# Patient Record
Sex: Female | Born: 1955 | Hispanic: Refuse to answer | Marital: Married | Smoking: Never smoker
Health system: Southern US, Community
[De-identification: ages and names within clinical notes are randomized; demographics above are authoritative.]

## PROBLEM LIST (undated history)

## (undated) DIAGNOSIS — K219 Gastro-esophageal reflux disease without esophagitis: Secondary | ICD-10-CM

## (undated) DIAGNOSIS — M545 Low back pain: Secondary | ICD-10-CM

## (undated) DIAGNOSIS — H6980 Other specified disorders of Eustachian tube, unspecified ear: Secondary | ICD-10-CM

## (undated) DIAGNOSIS — R221 Localized swelling, mass and lump, neck: Secondary | ICD-10-CM

## (undated) DIAGNOSIS — M810 Age-related osteoporosis without current pathological fracture: Secondary | ICD-10-CM

## (undated) DIAGNOSIS — Z923 Personal history of irradiation: Secondary | ICD-10-CM

## (undated) DIAGNOSIS — C50919 Malignant neoplasm of unspecified site of unspecified female breast: Secondary | ICD-10-CM

## (undated) DIAGNOSIS — J309 Allergic rhinitis, unspecified: Secondary | ICD-10-CM

## (undated) DIAGNOSIS — F329 Major depressive disorder, single episode, unspecified: Secondary | ICD-10-CM

## (undated) DIAGNOSIS — H8120 Vestibular neuronitis, unspecified ear: Secondary | ICD-10-CM

## (undated) DIAGNOSIS — Z862 Personal history of diseases of the blood and blood-forming organs and certain disorders involving the immune mechanism: Secondary | ICD-10-CM

## (undated) DIAGNOSIS — E785 Hyperlipidemia, unspecified: Secondary | ICD-10-CM

## (undated) DIAGNOSIS — M51369 Other intervertebral disc degeneration, lumbar region without mention of lumbar back pain or lower extremity pain: Secondary | ICD-10-CM

## (undated) DIAGNOSIS — B079 Viral wart, unspecified: Secondary | ICD-10-CM

## (undated) DIAGNOSIS — M5136 Other intervertebral disc degeneration, lumbar region: Secondary | ICD-10-CM

## (undated) DIAGNOSIS — N390 Urinary tract infection, site not specified: Secondary | ICD-10-CM

## (undated) HISTORY — DX: Other specified disorders of Eustachian tube, unspecified ear: H69.80

## (undated) HISTORY — DX: Vestibular neuronitis, unspecified ear: H81.20

## (undated) HISTORY — DX: Low back pain: M54.5

## (undated) HISTORY — DX: Other intervertebral disc degeneration, lumbar region: M51.36

## (undated) HISTORY — DX: Urinary tract infection, site not specified: N39.0

## (undated) HISTORY — DX: Hyperlipidemia, unspecified: E78.5

## (undated) HISTORY — DX: Age-related osteoporosis without current pathological fracture: M81.0

## (undated) HISTORY — DX: Personal history of diseases of the blood and blood-forming organs and certain disorders involving the immune mechanism: Z86.2

## (undated) HISTORY — DX: Localized swelling, mass and lump, neck: R22.1

## (undated) HISTORY — PX: KNEE SURGERY: SHX244

## (undated) HISTORY — DX: Other intervertebral disc degeneration, lumbar region without mention of lumbar back pain or lower extremity pain: M51.369

## (undated) HISTORY — DX: Gastro-esophageal reflux disease without esophagitis: K21.9

## (undated) HISTORY — DX: Viral wart, unspecified: B07.9

## (undated) HISTORY — DX: Major depressive disorder, single episode, unspecified: F32.9

## (undated) HISTORY — DX: Allergic rhinitis, unspecified: J30.9

## (undated) HISTORY — PX: TOTAL ABDOMINAL HYSTERECTOMY: SHX209

## (undated) HISTORY — DX: Malignant neoplasm of unspecified site of unspecified female breast: C50.919

---

## 1999-07-25 ENCOUNTER — Other Ambulatory Visit: Admission: RE | Admit: 1999-07-25 | Discharge: 1999-07-25 | Payer: Self-pay | Admitting: Internal Medicine

## 1999-11-06 ENCOUNTER — Ambulatory Visit (HOSPITAL_COMMUNITY): Admission: RE | Admit: 1999-11-06 | Discharge: 1999-11-06 | Payer: Self-pay | Admitting: Internal Medicine

## 1999-11-06 ENCOUNTER — Encounter: Payer: Self-pay | Admitting: Internal Medicine

## 1999-11-26 ENCOUNTER — Encounter: Payer: Self-pay | Admitting: Internal Medicine

## 1999-11-26 ENCOUNTER — Encounter: Admission: RE | Admit: 1999-11-26 | Discharge: 1999-11-26 | Payer: Self-pay | Admitting: Internal Medicine

## 2000-12-01 ENCOUNTER — Ambulatory Visit (HOSPITAL_COMMUNITY): Admission: RE | Admit: 2000-12-01 | Discharge: 2000-12-01 | Payer: Self-pay | Admitting: Internal Medicine

## 2000-12-01 ENCOUNTER — Encounter: Payer: Self-pay | Admitting: Internal Medicine

## 2000-12-17 ENCOUNTER — Encounter: Admission: RE | Admit: 2000-12-17 | Discharge: 2000-12-17 | Payer: Self-pay | Admitting: Internal Medicine

## 2000-12-17 ENCOUNTER — Encounter: Payer: Self-pay | Admitting: Internal Medicine

## 2001-02-09 ENCOUNTER — Ambulatory Visit (HOSPITAL_COMMUNITY): Admission: RE | Admit: 2001-02-09 | Discharge: 2001-02-09 | Payer: Self-pay | Admitting: Family Medicine

## 2001-02-09 ENCOUNTER — Encounter: Payer: Self-pay | Admitting: Family Medicine

## 2001-02-23 ENCOUNTER — Ambulatory Visit (HOSPITAL_COMMUNITY): Admission: RE | Admit: 2001-02-23 | Discharge: 2001-02-23 | Payer: Self-pay | Admitting: Orthopedic Surgery

## 2001-02-23 ENCOUNTER — Encounter: Payer: Self-pay | Admitting: Orthopedic Surgery

## 2001-04-04 ENCOUNTER — Ambulatory Visit (HOSPITAL_BASED_OUTPATIENT_CLINIC_OR_DEPARTMENT_OTHER): Admission: RE | Admit: 2001-04-04 | Discharge: 2001-04-04 | Payer: Self-pay | Admitting: *Deleted

## 2002-03-21 ENCOUNTER — Encounter: Admission: RE | Admit: 2002-03-21 | Discharge: 2002-03-21 | Payer: Self-pay | Admitting: Obstetrics and Gynecology

## 2002-03-21 ENCOUNTER — Encounter: Payer: Self-pay | Admitting: Obstetrics and Gynecology

## 2003-05-25 ENCOUNTER — Encounter: Admission: RE | Admit: 2003-05-25 | Discharge: 2003-05-25 | Payer: Self-pay | Admitting: Unknown Physician Specialty

## 2003-05-31 ENCOUNTER — Encounter: Admission: RE | Admit: 2003-05-31 | Discharge: 2003-05-31 | Payer: Self-pay | Admitting: Internal Medicine

## 2004-04-25 ENCOUNTER — Ambulatory Visit: Payer: Self-pay | Admitting: Internal Medicine

## 2004-05-08 ENCOUNTER — Ambulatory Visit: Payer: Self-pay | Admitting: Internal Medicine

## 2004-06-02 ENCOUNTER — Ambulatory Visit: Payer: Self-pay | Admitting: Internal Medicine

## 2004-06-23 ENCOUNTER — Encounter: Admission: RE | Admit: 2004-06-23 | Discharge: 2004-06-23 | Payer: Self-pay | Admitting: Obstetrics and Gynecology

## 2004-08-28 ENCOUNTER — Ambulatory Visit: Payer: Self-pay | Admitting: Internal Medicine

## 2004-12-11 ENCOUNTER — Ambulatory Visit: Payer: Self-pay | Admitting: Internal Medicine

## 2004-12-18 ENCOUNTER — Ambulatory Visit: Payer: Self-pay | Admitting: Internal Medicine

## 2005-02-16 ENCOUNTER — Ambulatory Visit: Payer: Self-pay | Admitting: Internal Medicine

## 2005-02-26 ENCOUNTER — Ambulatory Visit: Payer: Self-pay | Admitting: Internal Medicine

## 2005-03-04 ENCOUNTER — Other Ambulatory Visit: Admission: RE | Admit: 2005-03-04 | Discharge: 2005-03-04 | Payer: Self-pay | Admitting: Obstetrics and Gynecology

## 2005-03-26 ENCOUNTER — Ambulatory Visit: Payer: Self-pay | Admitting: Internal Medicine

## 2005-04-16 ENCOUNTER — Ambulatory Visit (HOSPITAL_BASED_OUTPATIENT_CLINIC_OR_DEPARTMENT_OTHER): Admission: RE | Admit: 2005-04-16 | Discharge: 2005-04-16 | Payer: Self-pay | Admitting: Internal Medicine

## 2005-04-24 ENCOUNTER — Ambulatory Visit: Payer: Self-pay | Admitting: Pulmonary Disease

## 2005-05-12 ENCOUNTER — Ambulatory Visit: Payer: Self-pay | Admitting: Internal Medicine

## 2005-05-21 ENCOUNTER — Ambulatory Visit (HOSPITAL_COMMUNITY): Admission: RE | Admit: 2005-05-21 | Discharge: 2005-05-21 | Payer: Self-pay | Admitting: *Deleted

## 2005-05-28 ENCOUNTER — Encounter: Admission: RE | Admit: 2005-05-28 | Discharge: 2005-08-26 | Payer: Self-pay

## 2005-06-05 ENCOUNTER — Ambulatory Visit (HOSPITAL_COMMUNITY): Admission: RE | Admit: 2005-06-05 | Discharge: 2005-06-05 | Payer: Self-pay | Admitting: *Deleted

## 2005-06-23 ENCOUNTER — Ambulatory Visit: Payer: Self-pay | Admitting: Internal Medicine

## 2005-07-07 ENCOUNTER — Encounter: Admission: RE | Admit: 2005-07-07 | Discharge: 2005-07-07 | Payer: Self-pay | Admitting: Obstetrics and Gynecology

## 2005-07-08 ENCOUNTER — Ambulatory Visit: Payer: Self-pay | Admitting: Internal Medicine

## 2005-08-17 ENCOUNTER — Ambulatory Visit: Payer: Self-pay | Admitting: Internal Medicine

## 2005-10-27 ENCOUNTER — Ambulatory Visit: Payer: Self-pay | Admitting: Internal Medicine

## 2005-11-24 ENCOUNTER — Ambulatory Visit: Payer: Self-pay | Admitting: Internal Medicine

## 2005-11-28 ENCOUNTER — Emergency Department (HOSPITAL_COMMUNITY): Admission: EM | Admit: 2005-11-28 | Discharge: 2005-11-28 | Payer: Self-pay | Admitting: Family Medicine

## 2005-12-08 ENCOUNTER — Ambulatory Visit: Payer: Self-pay | Admitting: Internal Medicine

## 2006-01-11 ENCOUNTER — Encounter: Admission: RE | Admit: 2006-01-11 | Discharge: 2006-04-11 | Payer: Self-pay | Admitting: *Deleted

## 2006-01-25 ENCOUNTER — Ambulatory Visit (HOSPITAL_COMMUNITY): Admission: RE | Admit: 2006-01-25 | Discharge: 2006-01-27 | Payer: Self-pay | Admitting: *Deleted

## 2006-01-25 HISTORY — PX: GASTRIC BYPASS: SHX52

## 2006-01-26 ENCOUNTER — Encounter: Payer: Self-pay | Admitting: Vascular Surgery

## 2006-02-09 ENCOUNTER — Ambulatory Visit: Payer: Self-pay | Admitting: Internal Medicine

## 2006-03-09 ENCOUNTER — Encounter: Admission: RE | Admit: 2006-03-09 | Discharge: 2006-06-07 | Payer: Self-pay | Admitting: *Deleted

## 2006-04-07 ENCOUNTER — Ambulatory Visit: Payer: Self-pay | Admitting: Internal Medicine

## 2006-04-07 LAB — CONVERTED CEMR LAB
ALT: 18 units/L (ref 0–40)
AST: 16 units/L (ref 0–37)
Albumin: 3.8 g/dL (ref 3.5–5.2)
Alkaline Phosphatase: 96 units/L (ref 39–117)
Basophils Absolute: 0 10*3/uL (ref 0.0–0.1)
Basophils Relative: 0.7 % (ref 0.0–1.0)
Bilirubin, Direct: 0.1 mg/dL (ref 0.0–0.3)
Eosinophil percent: 2.2 % (ref 0.0–5.0)
Folate: 17.2 ng/mL
HCT: 42.8 % (ref 36.0–46.0)
Hemoglobin: 14.2 g/dL (ref 12.0–15.0)
Iron: 53 ug/dL (ref 42–145)
Lymphocytes Relative: 31.9 % (ref 12.0–46.0)
MCHC: 33.2 g/dL (ref 30.0–36.0)
MCV: 90.6 fL (ref 78.0–100.0)
Monocytes Absolute: 0.6 10*3/uL (ref 0.2–0.7)
Monocytes Relative: 10.5 % (ref 3.0–11.0)
Neutro Abs: 3 10*3/uL (ref 1.4–7.7)
Neutrophils Relative %: 54.7 % (ref 43.0–77.0)
Platelets: 268 10*3/uL (ref 150–400)
RBC: 4.72 M/uL (ref 3.87–5.11)
RDW: 13.3 % (ref 11.5–14.6)
Total Bilirubin: 0.7 mg/dL (ref 0.3–1.2)
Total Protein: 6.6 g/dL (ref 6.0–8.3)
Vitamin B-12: 945 pg/mL — ABNORMAL HIGH (ref 211–911)
WBC: 5.5 10*3/uL (ref 4.5–10.5)

## 2006-04-13 ENCOUNTER — Ambulatory Visit: Payer: Self-pay | Admitting: Internal Medicine

## 2006-06-25 ENCOUNTER — Ambulatory Visit: Payer: Self-pay | Admitting: Internal Medicine

## 2006-06-25 LAB — CONVERTED CEMR LAB
Folate: 14.5 ng/mL
Iron: 41 ug/dL — ABNORMAL LOW (ref 42–145)
Saturation Ratios: 17.6 % — ABNORMAL LOW (ref 20.0–50.0)
Transferrin: 166.4 mg/dL — ABNORMAL LOW (ref >=212.0)
Vitamin B-12: 1283 pg/mL — ABNORMAL HIGH (ref 211–911)

## 2006-07-02 ENCOUNTER — Ambulatory Visit: Payer: Self-pay | Admitting: Internal Medicine

## 2006-07-19 ENCOUNTER — Encounter: Admission: RE | Admit: 2006-07-19 | Discharge: 2006-07-19 | Payer: Self-pay | Admitting: Obstetrics and Gynecology

## 2006-08-03 ENCOUNTER — Encounter: Admission: RE | Admit: 2006-08-03 | Discharge: 2006-08-03 | Payer: Self-pay | Admitting: Obstetrics and Gynecology

## 2006-08-09 ENCOUNTER — Encounter: Admission: RE | Admit: 2006-08-09 | Discharge: 2006-11-07 | Payer: Self-pay | Admitting: *Deleted

## 2006-09-02 ENCOUNTER — Ambulatory Visit: Payer: Self-pay | Admitting: Internal Medicine

## 2006-09-02 LAB — CONVERTED CEMR LAB: Iron: 50 ug/dL (ref 42–145)

## 2006-09-09 ENCOUNTER — Ambulatory Visit: Payer: Self-pay | Admitting: Internal Medicine

## 2006-10-13 ENCOUNTER — Ambulatory Visit: Payer: Self-pay | Admitting: Internal Medicine

## 2006-10-14 LAB — CONVERTED CEMR LAB
ALT: 17 units/L (ref 0–40)
AST: 15 units/L (ref 0–37)
Albumin: 3.8 g/dL (ref 3.5–5.2)
Alkaline Phosphatase: 110 units/L (ref 39–117)
Amylase: 70 units/L (ref 27–131)
Basophils Absolute: 0 10*3/uL (ref 0.0–0.1)
Basophils Relative: 0.5 % (ref 0.0–1.0)
Bilirubin, Direct: 0.1 mg/dL (ref 0.0–0.3)
Eosinophils Absolute: 0.2 10*3/uL (ref 0.0–0.6)
Eosinophils Relative: 1.9 % (ref 0.0–5.0)
HCT: 39.5 % (ref 36.0–46.0)
Hemoglobin: 13.8 g/dL (ref 12.0–15.0)
Lymphocytes Relative: 26.5 % (ref 12.0–46.0)
MCHC: 35 g/dL (ref 30.0–36.0)
MCV: 88.9 fL (ref 78.0–100.0)
Monocytes Absolute: 0.5 10*3/uL (ref 0.2–0.7)
Monocytes Relative: 6.1 % (ref 3.0–11.0)
Neutro Abs: 5.2 10*3/uL (ref 1.4–7.7)
Neutrophils Relative %: 65 % (ref 43.0–77.0)
Platelets: 257 10*3/uL (ref 150–400)
RBC: 4.44 M/uL (ref 3.87–5.11)
RDW: 12.2 % (ref 11.5–14.6)
Total Bilirubin: 0.5 mg/dL (ref 0.3–1.2)
Total Protein: 6.1 g/dL (ref 6.0–8.3)
WBC: 8 10*3/uL (ref 4.5–10.5)

## 2006-10-18 ENCOUNTER — Ambulatory Visit: Payer: Self-pay | Admitting: Gastroenterology

## 2006-10-23 ENCOUNTER — Emergency Department (HOSPITAL_COMMUNITY): Admission: EM | Admit: 2006-10-23 | Discharge: 2006-10-23 | Payer: Self-pay | Admitting: Family Medicine

## 2006-11-02 ENCOUNTER — Ambulatory Visit: Payer: Self-pay | Admitting: Internal Medicine

## 2006-11-02 LAB — CONVERTED CEMR LAB: Iron: 94 ug/dL (ref 42–145)

## 2006-11-10 ENCOUNTER — Ambulatory Visit: Payer: Self-pay | Admitting: Internal Medicine

## 2006-11-12 DIAGNOSIS — E785 Hyperlipidemia, unspecified: Secondary | ICD-10-CM | POA: Insufficient documentation

## 2006-11-12 DIAGNOSIS — K219 Gastro-esophageal reflux disease without esophagitis: Secondary | ICD-10-CM | POA: Insufficient documentation

## 2006-11-12 DIAGNOSIS — F329 Major depressive disorder, single episode, unspecified: Secondary | ICD-10-CM | POA: Insufficient documentation

## 2006-11-12 DIAGNOSIS — F3289 Other specified depressive episodes: Secondary | ICD-10-CM | POA: Insufficient documentation

## 2006-11-12 DIAGNOSIS — N390 Urinary tract infection, site not specified: Secondary | ICD-10-CM

## 2006-11-12 HISTORY — DX: Hyperlipidemia, unspecified: E78.5

## 2006-11-12 HISTORY — DX: Urinary tract infection, site not specified: N39.0

## 2006-11-12 HISTORY — DX: Gastro-esophageal reflux disease without esophagitis: K21.9

## 2006-12-13 ENCOUNTER — Encounter: Payer: Self-pay | Admitting: *Deleted

## 2007-01-03 DIAGNOSIS — R221 Localized swelling, mass and lump, neck: Secondary | ICD-10-CM

## 2007-01-03 HISTORY — DX: Localized swelling, mass and lump, neck: R22.1

## 2007-01-26 ENCOUNTER — Encounter: Admission: RE | Admit: 2007-01-26 | Discharge: 2007-01-26 | Payer: Self-pay | Admitting: Obstetrics and Gynecology

## 2007-02-03 ENCOUNTER — Ambulatory Visit: Payer: Self-pay | Admitting: Internal Medicine

## 2007-02-03 DIAGNOSIS — F32A Depression, unspecified: Secondary | ICD-10-CM

## 2007-02-03 DIAGNOSIS — J309 Allergic rhinitis, unspecified: Secondary | ICD-10-CM

## 2007-02-03 DIAGNOSIS — M545 Low back pain, unspecified: Secondary | ICD-10-CM

## 2007-02-03 HISTORY — DX: Depression, unspecified: F32.A

## 2007-02-03 HISTORY — DX: Low back pain, unspecified: M54.50

## 2007-02-03 HISTORY — DX: Allergic rhinitis, unspecified: J30.9

## 2007-02-03 LAB — CONVERTED CEMR LAB
Folate: 9.2 ng/mL
Iron: 32 ug/dL — ABNORMAL LOW (ref 42–145)
Vitamin B-12: 927 pg/mL — ABNORMAL HIGH (ref 211–911)

## 2007-02-07 ENCOUNTER — Ambulatory Visit: Payer: Self-pay | Admitting: Internal Medicine

## 2007-02-07 ENCOUNTER — Telehealth: Payer: Self-pay | Admitting: *Deleted

## 2007-02-09 ENCOUNTER — Telehealth: Payer: Self-pay | Admitting: Internal Medicine

## 2007-02-10 ENCOUNTER — Encounter: Payer: Self-pay | Admitting: Internal Medicine

## 2007-02-17 ENCOUNTER — Telehealth (INDEPENDENT_AMBULATORY_CARE_PROVIDER_SITE_OTHER): Payer: Self-pay | Admitting: *Deleted

## 2007-03-17 ENCOUNTER — Telehealth (INDEPENDENT_AMBULATORY_CARE_PROVIDER_SITE_OTHER): Payer: Self-pay | Admitting: *Deleted

## 2007-03-18 ENCOUNTER — Telehealth: Payer: Self-pay | Admitting: *Deleted

## 2007-05-09 ENCOUNTER — Ambulatory Visit: Payer: Self-pay | Admitting: Internal Medicine

## 2007-05-09 DIAGNOSIS — Z862 Personal history of diseases of the blood and blood-forming organs and certain disorders involving the immune mechanism: Secondary | ICD-10-CM

## 2007-05-09 HISTORY — DX: Personal history of diseases of the blood and blood-forming organs and certain disorders involving the immune mechanism: Z86.2

## 2007-05-09 LAB — CONVERTED CEMR LAB
Basophils Absolute: 0.1 10*3/uL (ref 0.0–0.1)
Basophils Relative: 0.8 % (ref 0.0–1.0)
Cholesterol, target level: 200 mg/dL
Eosinophils Absolute: 0.1 10*3/uL (ref 0.0–0.6)
Eosinophils Relative: 1.7 % (ref 0.0–5.0)
HCT: 39.4 % (ref 36.0–46.0)
HDL goal, serum: 40 mg/dL
Hemoglobin: 13.3 g/dL (ref 12.0–15.0)
Iron: 45 ug/dL (ref 42–145)
LDL Goal: 160 mg/dL
Lymphocytes Relative: 39.1 % (ref 12.0–46.0)
MCHC: 33.8 g/dL (ref 30.0–36.0)
MCV: 90 fL (ref 78.0–100.0)
Monocytes Absolute: 0.4 10*3/uL (ref 0.2–0.7)
Monocytes Relative: 6.3 % (ref 3.0–11.0)
Neutro Abs: 3.4 10*3/uL (ref 1.4–7.7)
Neutrophils Relative %: 52.1 % (ref 43.0–77.0)
Platelets: 244 10*3/uL (ref 150–400)
RBC: 4.38 M/uL (ref 3.87–5.11)
RDW: 12.1 % (ref 11.5–14.6)
Transferrin: 169.9 mg/dL — ABNORMAL LOW (ref >=212.0)
WBC: 6.5 10*3/uL (ref 4.5–10.5)

## 2007-05-19 DIAGNOSIS — Z923 Personal history of irradiation: Secondary | ICD-10-CM

## 2007-05-19 DIAGNOSIS — C50919 Malignant neoplasm of unspecified site of unspecified female breast: Secondary | ICD-10-CM

## 2007-05-19 HISTORY — PX: BREAST BIOPSY: SHX20

## 2007-05-19 HISTORY — DX: Malignant neoplasm of unspecified site of unspecified female breast: C50.919

## 2007-05-19 HISTORY — DX: Personal history of irradiation: Z92.3

## 2007-05-19 HISTORY — PX: BREAST LUMPECTOMY: SHX2

## 2007-06-03 ENCOUNTER — Ambulatory Visit: Payer: Self-pay | Admitting: Internal Medicine

## 2007-06-03 ENCOUNTER — Telehealth (INDEPENDENT_AMBULATORY_CARE_PROVIDER_SITE_OTHER): Payer: Self-pay | Admitting: Family Medicine

## 2007-06-04 ENCOUNTER — Encounter: Payer: Self-pay | Admitting: Internal Medicine

## 2007-06-28 ENCOUNTER — Telehealth (INDEPENDENT_AMBULATORY_CARE_PROVIDER_SITE_OTHER): Payer: Self-pay | Admitting: *Deleted

## 2007-07-22 ENCOUNTER — Encounter: Admission: RE | Admit: 2007-07-22 | Discharge: 2007-07-22 | Payer: Self-pay | Admitting: Obstetrics and Gynecology

## 2007-07-29 ENCOUNTER — Telehealth: Payer: Self-pay | Admitting: Internal Medicine

## 2007-08-04 ENCOUNTER — Encounter (INDEPENDENT_AMBULATORY_CARE_PROVIDER_SITE_OTHER): Payer: Self-pay | Admitting: Diagnostic Radiology

## 2007-08-04 ENCOUNTER — Encounter: Admission: RE | Admit: 2007-08-04 | Discharge: 2007-08-04 | Payer: Self-pay | Admitting: Obstetrics and Gynecology

## 2007-08-11 ENCOUNTER — Encounter: Payer: Self-pay | Admitting: Internal Medicine

## 2007-08-11 ENCOUNTER — Encounter: Admission: RE | Admit: 2007-08-11 | Discharge: 2007-08-11 | Payer: Self-pay | Admitting: Obstetrics and Gynecology

## 2007-08-17 ENCOUNTER — Encounter: Admission: RE | Admit: 2007-08-17 | Discharge: 2007-08-17 | Payer: Self-pay | Admitting: *Deleted

## 2007-08-17 ENCOUNTER — Ambulatory Visit (HOSPITAL_COMMUNITY): Admission: RE | Admit: 2007-08-17 | Discharge: 2007-08-17 | Payer: Self-pay | Admitting: *Deleted

## 2007-08-17 ENCOUNTER — Encounter (INDEPENDENT_AMBULATORY_CARE_PROVIDER_SITE_OTHER): Payer: Self-pay | Admitting: *Deleted

## 2007-08-17 HISTORY — PX: BREAST LUMPECTOMY WITH AXILLARY LYMPH NODE BIOPSY: SHX5593

## 2007-08-24 ENCOUNTER — Encounter (INDEPENDENT_AMBULATORY_CARE_PROVIDER_SITE_OTHER): Payer: Self-pay | Admitting: *Deleted

## 2007-08-24 ENCOUNTER — Ambulatory Visit (HOSPITAL_BASED_OUTPATIENT_CLINIC_OR_DEPARTMENT_OTHER): Admission: RE | Admit: 2007-08-24 | Discharge: 2007-08-24 | Payer: Self-pay | Admitting: *Deleted

## 2007-09-01 ENCOUNTER — Ambulatory Visit: Payer: Self-pay | Admitting: Oncology

## 2007-09-14 ENCOUNTER — Encounter: Payer: Self-pay | Admitting: Internal Medicine

## 2007-09-15 ENCOUNTER — Ambulatory Visit: Admission: RE | Admit: 2007-09-15 | Discharge: 2007-12-09 | Payer: Self-pay | Admitting: Radiation Oncology

## 2007-09-16 ENCOUNTER — Encounter: Payer: Self-pay | Admitting: Internal Medicine

## 2007-09-19 LAB — CBC WITH DIFFERENTIAL/PLATELET
BASO%: 3 % — ABNORMAL HIGH (ref 0.0–2.0)
Basophils Absolute: 0.2 10*3/uL — ABNORMAL HIGH (ref 0.0–0.1)
EOS%: 1.4 % (ref 0.0–7.0)
Eosinophils Absolute: 0.1 10*3/uL (ref 0.0–0.5)
HCT: 39 % (ref 34.8–46.6)
HGB: 13.6 g/dL (ref 11.6–15.9)
LYMPH%: 38.4 % (ref 14.0–48.0)
MCH: 31.1 pg (ref 26.0–34.0)
MCHC: 34.8 g/dL (ref 32.0–36.0)
MCV: 89.5 fL (ref 81.0–101.0)
MONO#: 0.4 10*3/uL (ref 0.1–0.9)
MONO%: 7.4 % (ref 0.0–13.0)
NEUT#: 2.9 10*3/uL (ref 1.5–6.5)
NEUT%: 49.8 % (ref 39.6–76.8)
Platelets: 258 10*3/uL (ref 145–400)
RBC: 4.35 10*6/uL (ref 3.70–5.32)
RDW: 12.7 % (ref 11.3–14.5)
WBC: 5.8 10*3/uL (ref 3.9–10.0)
lymph#: 2.2 10*3/uL (ref 0.9–3.3)

## 2007-09-20 LAB — COMPREHENSIVE METABOLIC PANEL
ALT: 15 U/L (ref 0–35)
AST: 13 U/L (ref 0–37)
Albumin: 3.9 g/dL (ref 3.5–5.2)
Alkaline Phosphatase: 73 U/L (ref 39–117)
BUN: 16 mg/dL (ref 6–23)
CO2: 28 mEq/L (ref 19–32)
Calcium: 8.9 mg/dL (ref 8.4–10.5)
Chloride: 110 mEq/L (ref 96–112)
Creatinine, Ser: 0.69 mg/dL (ref 0.40–1.20)
Glucose, Bld: 127 mg/dL — ABNORMAL HIGH (ref 70–99)
Potassium: 4.9 mEq/L (ref 3.5–5.3)
Sodium: 144 mEq/L (ref 135–145)
Total Bilirubin: 0.3 mg/dL (ref 0.3–1.2)
Total Protein: 5.9 g/dL — ABNORMAL LOW (ref 6.0–8.3)

## 2007-09-20 LAB — LACTATE DEHYDROGENASE: LDH: 130 U/L (ref 94–250)

## 2007-09-20 LAB — CANCER ANTIGEN 27.29: CA 27.29: 9 U/mL (ref 0–39)

## 2007-09-20 LAB — VITAMIN D 25 HYDROXY (VIT D DEFICIENCY, FRACTURES): Vit D, 25-Hydroxy: 18 ng/mL — ABNORMAL LOW (ref 30–89)

## 2007-10-14 ENCOUNTER — Telehealth: Payer: Self-pay | Admitting: Internal Medicine

## 2007-10-14 ENCOUNTER — Ambulatory Visit: Payer: Self-pay | Admitting: Internal Medicine

## 2007-10-14 LAB — CONVERTED CEMR LAB
Bilirubin Urine: NEGATIVE
Glucose, Urine, Semiquant: NEGATIVE
Ketones, urine, test strip: NEGATIVE
Nitrite: NEGATIVE
Protein, U semiquant: NEGATIVE
Specific Gravity, Urine: 1.015
Urobilinogen, UA: 0.2
pH: 7

## 2007-10-15 ENCOUNTER — Encounter: Payer: Self-pay | Admitting: Internal Medicine

## 2007-10-31 ENCOUNTER — Ambulatory Visit: Payer: Self-pay | Admitting: Oncology

## 2007-11-01 ENCOUNTER — Ambulatory Visit (HOSPITAL_COMMUNITY): Admission: RE | Admit: 2007-11-01 | Discharge: 2007-11-01 | Payer: Self-pay | Admitting: Oncology

## 2007-11-08 ENCOUNTER — Ambulatory Visit: Payer: Self-pay | Admitting: Internal Medicine

## 2007-11-08 LAB — CONVERTED CEMR LAB
Bilirubin Urine: NEGATIVE
Glucose, Urine, Semiquant: NEGATIVE
Ketones, urine, test strip: NEGATIVE
Nitrite: NEGATIVE
Specific Gravity, Urine: 1.015
Urobilinogen, UA: 0.2
pH: 5.5

## 2007-11-09 ENCOUNTER — Encounter: Payer: Self-pay | Admitting: Internal Medicine

## 2007-11-10 LAB — CREATININE, SERUM: Creatinine, Ser: 0.77 mg/dL (ref 0.40–1.20)

## 2007-11-10 LAB — BUN: BUN: 18 mg/dL (ref 6–23)

## 2007-11-12 ENCOUNTER — Encounter: Admission: RE | Admit: 2007-11-12 | Discharge: 2007-11-12 | Payer: Self-pay | Admitting: Oncology

## 2007-11-15 ENCOUNTER — Encounter: Payer: Self-pay | Admitting: Internal Medicine

## 2007-11-21 LAB — CBC WITH DIFFERENTIAL/PLATELET
BASO%: 1.6 % (ref 0.0–2.0)
Basophils Absolute: 0.1 10*3/uL (ref 0.0–0.1)
EOS%: 2.1 % (ref 0.0–7.0)
Eosinophils Absolute: 0.1 10*3/uL (ref 0.0–0.5)
HCT: 42.7 % (ref 34.8–46.6)
HGB: 14.6 g/dL (ref 11.6–15.9)
LYMPH%: 26.8 % (ref 14.0–48.0)
MCH: 30.7 pg (ref 26.0–34.0)
MCHC: 34.3 g/dL (ref 32.0–36.0)
MCV: 89.5 fL (ref 81.0–101.0)
MONO#: 0.3 10*3/uL (ref 0.1–0.9)
MONO%: 6.7 % (ref 0.0–13.0)
NEUT#: 2.8 10*3/uL (ref 1.5–6.5)
NEUT%: 62.8 % (ref 39.6–76.8)
Platelets: 218 10*3/uL (ref 145–400)
RBC: 4.78 10*6/uL (ref 3.70–5.32)
RDW: 12.5 % (ref 11.3–14.5)
WBC: 4.4 10*3/uL (ref 3.9–10.0)
lymph#: 1.2 10*3/uL (ref 0.9–3.3)

## 2007-11-22 LAB — COMPREHENSIVE METABOLIC PANEL
ALT: 31 U/L (ref 0–35)
AST: 18 U/L (ref 0–37)
Albumin: 4.3 g/dL (ref 3.5–5.2)
Alkaline Phosphatase: 85 U/L (ref 39–117)
BUN: 14 mg/dL (ref 6–23)
CO2: 27 mEq/L (ref 19–32)
Calcium: 9.3 mg/dL (ref 8.4–10.5)
Chloride: 104 mEq/L (ref 96–112)
Creatinine, Ser: 0.77 mg/dL (ref 0.40–1.20)
Glucose, Bld: 100 mg/dL — ABNORMAL HIGH (ref 70–99)
Potassium: 4.5 mEq/L (ref 3.5–5.3)
Sodium: 141 mEq/L (ref 135–145)
Total Bilirubin: 0.5 mg/dL (ref 0.3–1.2)
Total Protein: 6.7 g/dL (ref 6.0–8.3)

## 2007-11-22 LAB — VITAMIN D 25 HYDROXY (VIT D DEFICIENCY, FRACTURES): Vit D, 25-Hydroxy: 35 ng/mL (ref 30–89)

## 2007-11-22 LAB — LACTATE DEHYDROGENASE: LDH: 134 U/L (ref 94–250)

## 2007-11-22 LAB — FOLLICLE STIMULATING HORMONE: FSH: 87.7 m[IU]/mL

## 2007-11-22 LAB — CANCER ANTIGEN 27.29: CA 27.29: 12 U/mL (ref 0–39)

## 2007-11-22 LAB — LUTEINIZING HORMONE: LH: 87.2 m[IU]/mL

## 2007-11-28 ENCOUNTER — Encounter: Payer: Self-pay | Admitting: Internal Medicine

## 2007-11-29 LAB — ESTRADIOL, ULTRA SENS

## 2007-12-07 ENCOUNTER — Telehealth: Payer: Self-pay | Admitting: Internal Medicine

## 2007-12-20 ENCOUNTER — Telehealth: Payer: Self-pay | Admitting: Internal Medicine

## 2008-01-04 ENCOUNTER — Encounter: Payer: Self-pay | Admitting: Internal Medicine

## 2008-03-05 ENCOUNTER — Ambulatory Visit: Payer: Self-pay | Admitting: Oncology

## 2008-03-07 LAB — CBC WITH DIFFERENTIAL/PLATELET
BASO%: 0.6 % (ref 0.0–2.0)
Basophils Absolute: 0 10*3/uL (ref 0.0–0.1)
EOS%: 1.2 % (ref 0.0–7.0)
Eosinophils Absolute: 0.1 10*3/uL (ref 0.0–0.5)
HCT: 39.4 % (ref 34.8–46.6)
HGB: 13.7 g/dL (ref 11.6–15.9)
LYMPH%: 26.8 % (ref 14.0–48.0)
MCH: 31.1 pg (ref 26.0–34.0)
MCHC: 34.8 g/dL (ref 32.0–36.0)
MCV: 89.5 fL (ref 81.0–101.0)
MONO#: 0.4 10*3/uL (ref 0.1–0.9)
MONO%: 6.8 % (ref 0.0–13.0)
NEUT#: 3.8 10*3/uL (ref 1.5–6.5)
NEUT%: 64.6 % (ref 39.6–76.8)
Platelets: 249 10*3/uL (ref 145–400)
RBC: 4.41 10*6/uL (ref 3.70–5.32)
RDW: 12.4 % (ref 11.3–14.5)
WBC: 5.9 10*3/uL (ref 3.9–10.0)
lymph#: 1.6 10*3/uL (ref 0.9–3.3)

## 2008-03-08 LAB — COMPREHENSIVE METABOLIC PANEL
ALT: 13 U/L (ref 0–35)
AST: 14 U/L (ref 0–37)
Albumin: 4.3 g/dL (ref 3.5–5.2)
Alkaline Phosphatase: 87 U/L (ref 39–117)
BUN: 15 mg/dL (ref 6–23)
CO2: 27 mEq/L (ref 19–32)
Calcium: 9.2 mg/dL (ref 8.4–10.5)
Chloride: 104 mEq/L (ref 96–112)
Creatinine, Ser: 0.79 mg/dL (ref 0.40–1.20)
Glucose, Bld: 89 mg/dL (ref 70–99)
Potassium: 4.5 mEq/L (ref 3.5–5.3)
Sodium: 140 mEq/L (ref 135–145)
Total Bilirubin: 0.3 mg/dL (ref 0.3–1.2)
Total Protein: 6.6 g/dL (ref 6.0–8.3)

## 2008-03-08 LAB — CANCER ANTIGEN 27.29: CA 27.29: 10 U/mL (ref 0–39)

## 2008-03-08 LAB — VITAMIN D 25 HYDROXY (VIT D DEFICIENCY, FRACTURES): Vit D, 25-Hydroxy: 37 ng/mL (ref 30–89)

## 2008-03-08 LAB — LACTATE DEHYDROGENASE: LDH: 133 U/L (ref 94–250)

## 2008-04-11 ENCOUNTER — Encounter: Payer: Self-pay | Admitting: Internal Medicine

## 2008-07-23 ENCOUNTER — Encounter: Admission: RE | Admit: 2008-07-23 | Discharge: 2008-07-23 | Payer: Self-pay | Admitting: Oncology

## 2008-07-24 IMAGING — US US ABDOMEN LIMITED
1 series · 14 of 23 positions shown · non-contrast
Comparison: Prior ultrasound from [REDACTED] on 10/18/2006

CLINICAL DATA: Breast carcinoma.  Evaluate for liver metastasis.

ABDOMEN ULTRASOUND LIMITED

[Series 1: unknown · 0.19mm/px · 14 of 23 slices shown]
[im 1/23]
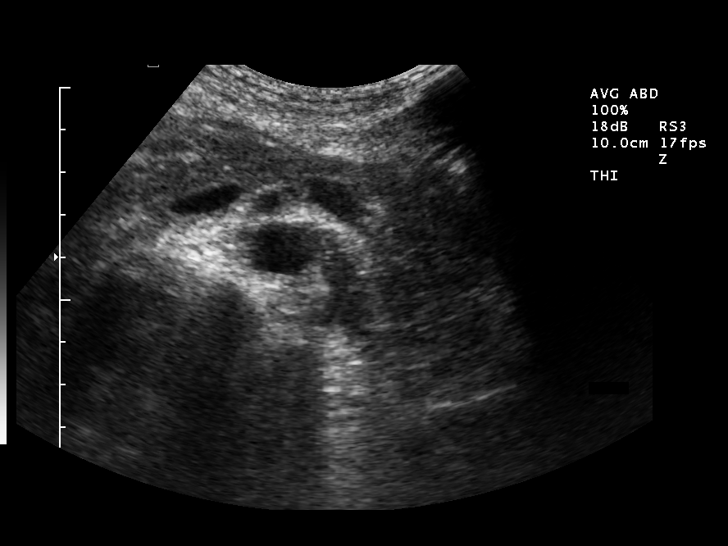
[im 3/23]
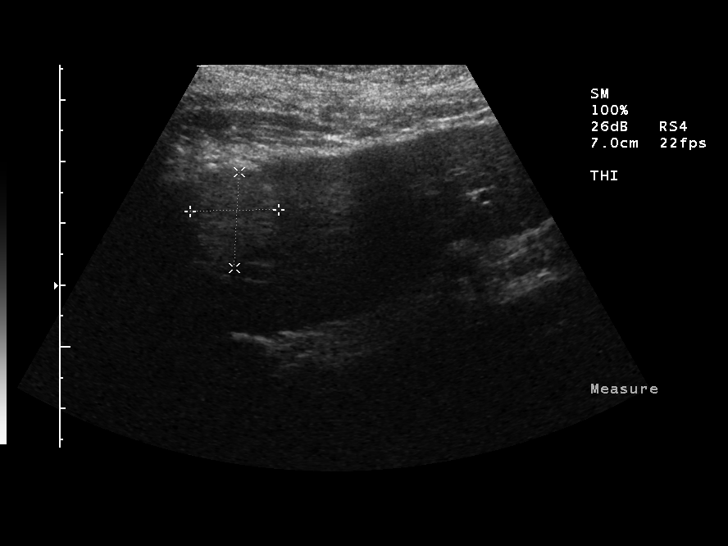
[im 5/23]
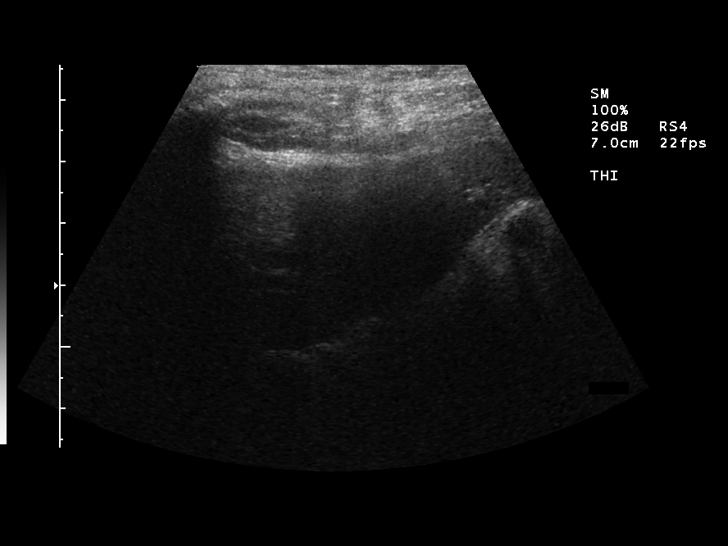
[im 6/23]
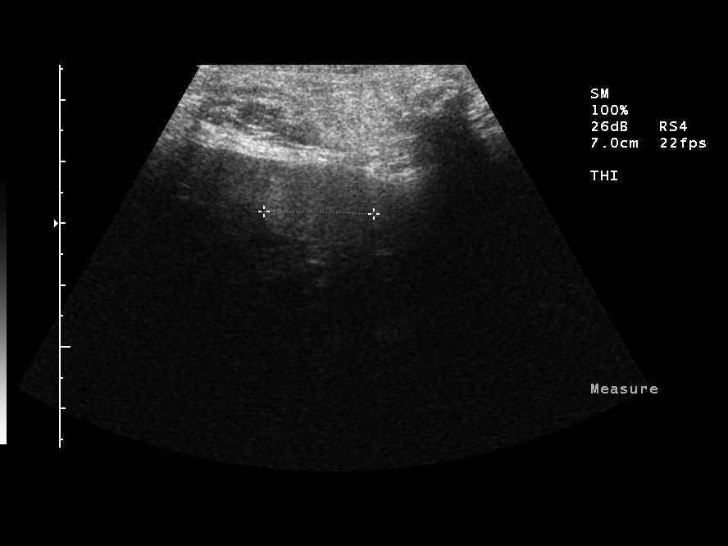
[im 8/23]
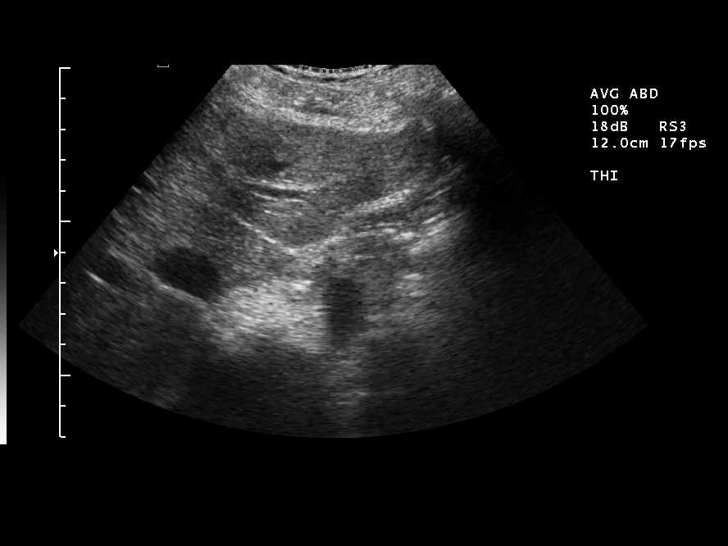
[im 10/23]
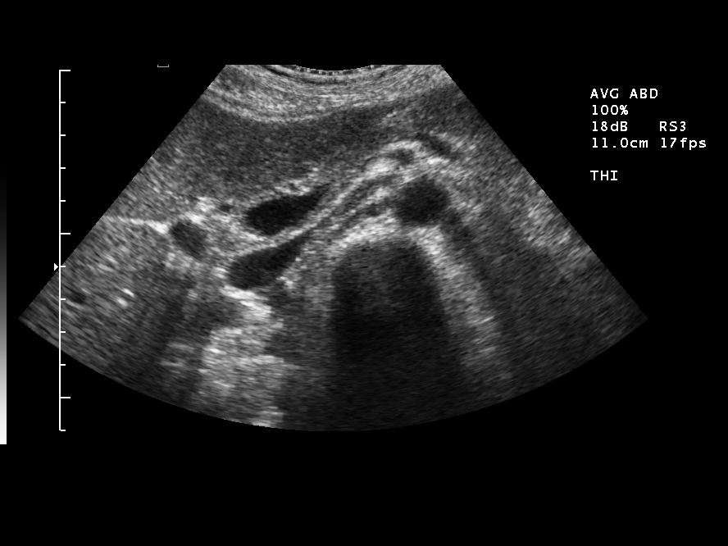
[im 11/23]
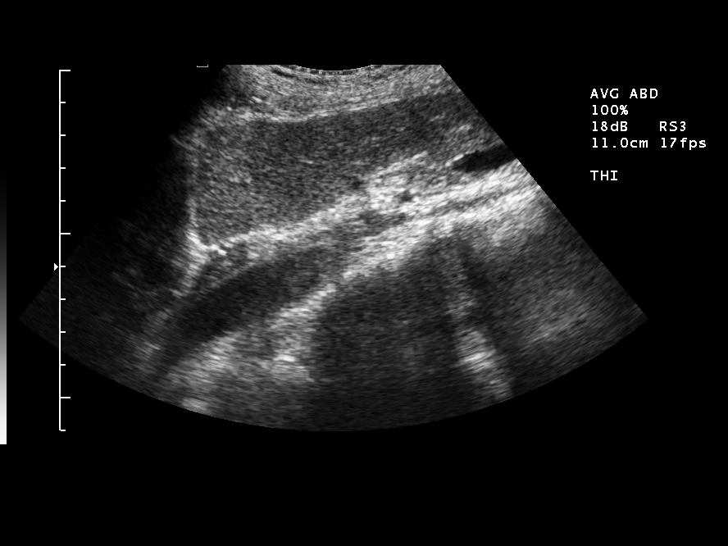
[im 13/23]
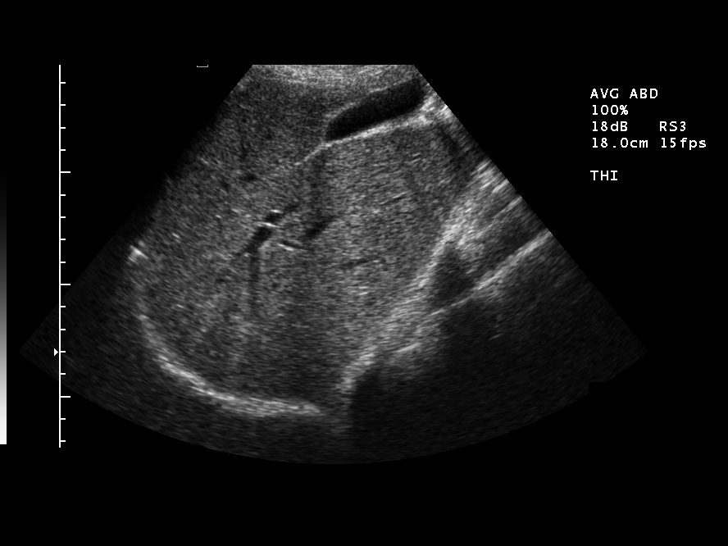
[im 14/23]
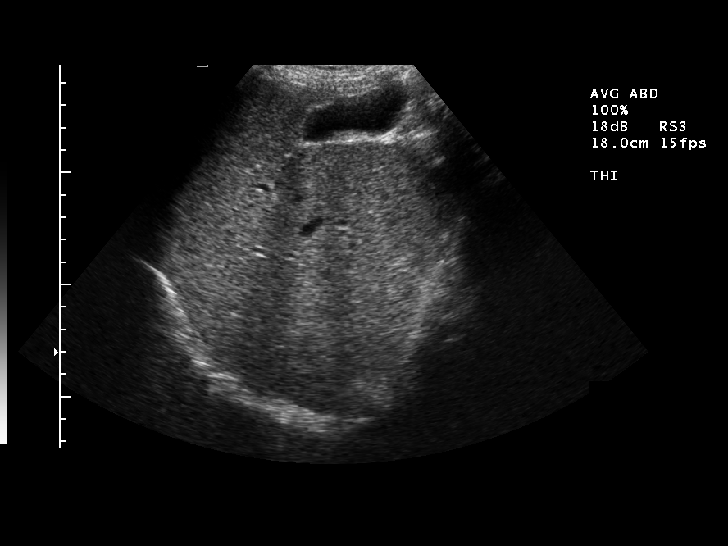
[im 16/23]
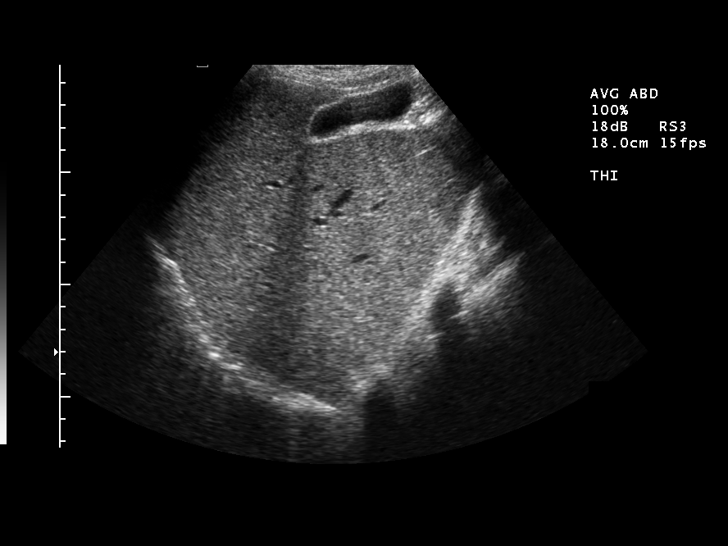
[im 18/23]
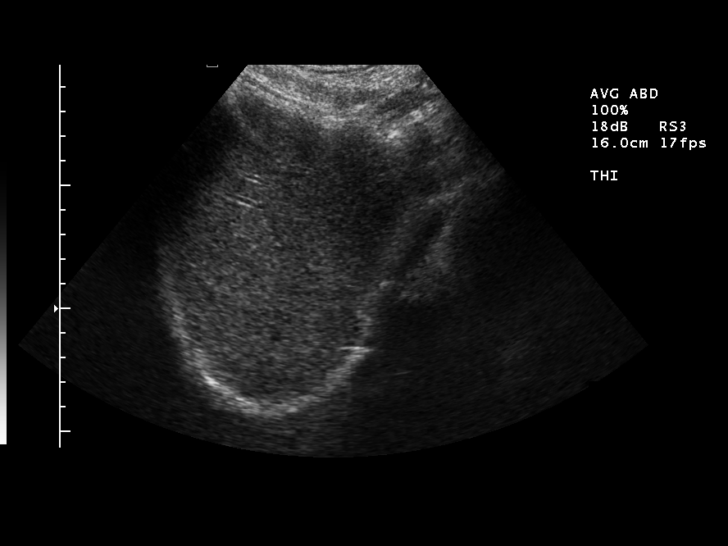
[im 19/23]
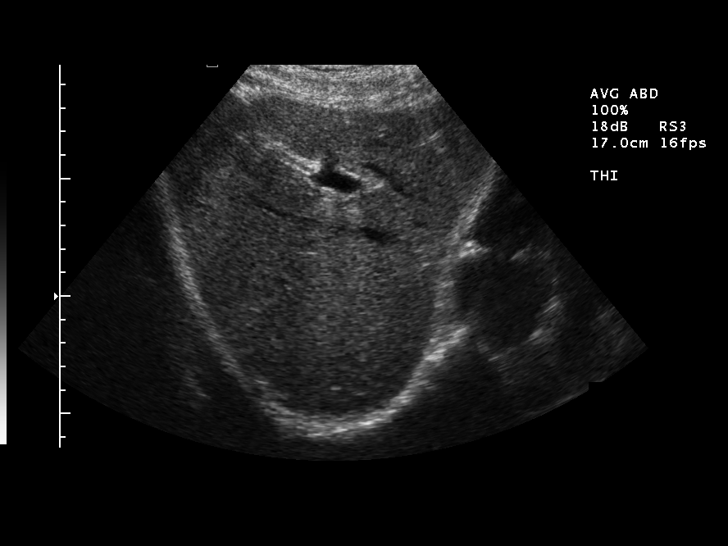
[im 21/23]
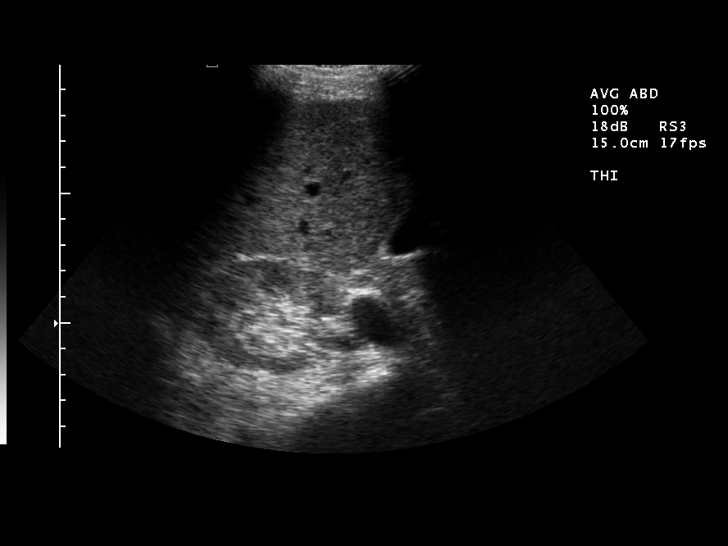
[im 23/23]
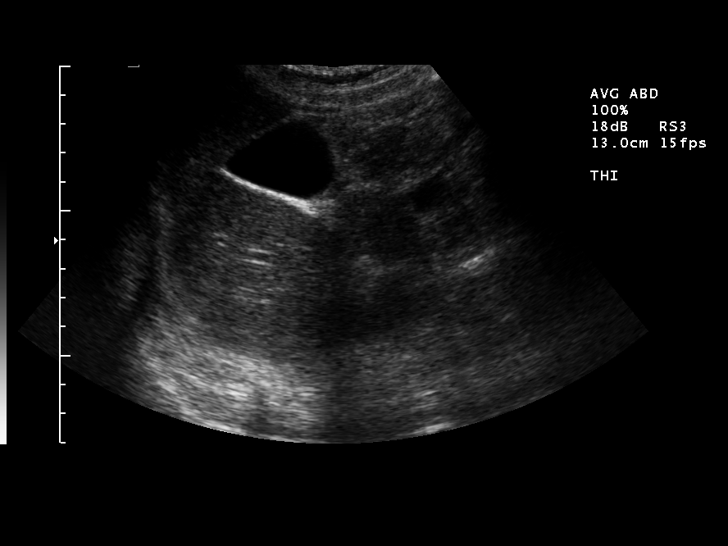

[14 of 23 positions shown; findings below may reference images not displayed]

FINDINGS: Limited abdominal ultrasound examination was performed to
evaluate the liver.  A 1.8 cm slightly hyperechoic mass is seen in
the left hepatic lobe. No other liver masses are identified.  This
was not seen on prior study.  This is nonspecific and could
represent a hemangioma, although liver metastasis cannot be
excluded.
IMPRESSION: 1.8 cm indeterminate mass in the left hepatic lobe.  Abdomen MRI
without and with contrast is recommended for further
characterization.

## 2008-08-22 ENCOUNTER — Ambulatory Visit: Payer: Self-pay | Admitting: Internal Medicine

## 2008-08-22 LAB — CONVERTED CEMR LAB: Vit D, 25-Hydroxy: 45 ng/mL (ref 30–89)

## 2008-08-27 LAB — CONVERTED CEMR LAB
Basophils Absolute: 0 10*3/uL (ref 0.0–0.1)
Basophils Relative: 0.5 % (ref 0.0–3.0)
Calcium: 9 mg/dL (ref 8.4–10.5)
Eosinophils Absolute: 0.1 10*3/uL (ref 0.0–0.7)
Eosinophils Relative: 2.2 % (ref 0.0–5.0)
Folate: 6.7 ng/mL
HCT: 39.9 % (ref 36.0–46.0)
Hemoglobin: 13.5 g/dL (ref 12.0–15.0)
Iron: 35 ug/dL — ABNORMAL LOW (ref 42–145)
Lymphocytes Relative: 29.3 % (ref 12.0–46.0)
Lymphs Abs: 1.5 10*3/uL (ref 0.7–4.0)
MCHC: 33.8 g/dL (ref 30.0–36.0)
MCV: 92.9 fL (ref 78.0–100.0)
Magnesium: 2.5 mg/dL (ref 1.5–2.5)
Monocytes Absolute: 0.3 10*3/uL (ref 0.1–1.0)
Monocytes Relative: 5.1 % (ref 3.0–12.0)
Neutro Abs: 3.2 10*3/uL (ref 1.4–7.7)
Neutrophils Relative %: 62.9 % (ref 43.0–77.0)
Platelets: 223 10*3/uL (ref 150.0–400.0)
RBC: 4.29 M/uL (ref 3.87–5.11)
RDW: 11.4 % — ABNORMAL LOW (ref 11.5–14.6)
Saturation Ratios: 15.5 % — ABNORMAL LOW (ref 20.0–50.0)
Transferrin: 161.7 mg/dL — ABNORMAL LOW (ref 212.0–360.0)
Vitamin B-12: >1500 pg/mL — ABNORMAL HIGH (ref 211–911)
WBC: 5.1 10*3/uL (ref 4.5–10.5)

## 2008-09-19 ENCOUNTER — Ambulatory Visit: Payer: Self-pay | Admitting: Internal Medicine

## 2008-09-21 LAB — CONVERTED CEMR LAB
Iron: 37 ug/dL — ABNORMAL LOW (ref 42–145)
Saturation Ratios: 15 % — ABNORMAL LOW (ref 20.0–50.0)
Transferrin: 176.5 mg/dL — ABNORMAL LOW (ref 212.0–360.0)

## 2008-09-24 ENCOUNTER — Telehealth: Payer: Self-pay | Admitting: Internal Medicine

## 2008-10-01 ENCOUNTER — Encounter: Payer: Self-pay | Admitting: Internal Medicine

## 2008-10-01 ENCOUNTER — Encounter (HOSPITAL_COMMUNITY): Admission: RE | Admit: 2008-10-01 | Discharge: 2008-11-14 | Payer: Self-pay | Admitting: Internal Medicine

## 2008-10-02 ENCOUNTER — Ambulatory Visit: Payer: Self-pay | Admitting: Oncology

## 2008-10-03 ENCOUNTER — Telehealth: Payer: Self-pay | Admitting: Internal Medicine

## 2008-10-04 LAB — CBC WITH DIFFERENTIAL/PLATELET
BASO%: 0.8 % (ref 0.0–2.0)
Basophils Absolute: 0 10*3/uL (ref 0.0–0.1)
EOS%: 1.4 % (ref 0.0–7.0)
Eosinophils Absolute: 0.1 10*3/uL (ref 0.0–0.5)
HCT: 39.9 % (ref 34.8–46.6)
HGB: 13.6 g/dL (ref 11.6–15.9)
LYMPH%: 27.1 % (ref 14.0–49.7)
MCH: 31.2 pg (ref 25.1–34.0)
MCHC: 34.2 g/dL (ref 31.5–36.0)
MCV: 91.3 fL (ref 79.5–101.0)
MONO#: 0.4 10*3/uL (ref 0.1–0.9)
MONO%: 7.9 % (ref 0.0–14.0)
NEUT#: 3.1 10*3/uL (ref 1.5–6.5)
NEUT%: 62.8 % (ref 38.4–76.8)
Platelets: 214 10*3/uL (ref 145–400)
RBC: 4.37 10*6/uL (ref 3.70–5.45)
RDW: 11.9 % (ref 11.2–14.5)
WBC: 5 10*3/uL (ref 3.9–10.3)
lymph#: 1.3 10*3/uL (ref 0.9–3.3)

## 2008-10-04 LAB — COMPREHENSIVE METABOLIC PANEL
ALT: 19 U/L (ref 0–35)
AST: 19 U/L (ref 0–37)
Albumin: 4 g/dL (ref 3.5–5.2)
Alkaline Phosphatase: 100 U/L (ref 39–117)
BUN: 13 mg/dL (ref 6–23)
CO2: 31 mEq/L (ref 19–32)
Calcium: 9.3 mg/dL (ref 8.4–10.5)
Chloride: 101 mEq/L (ref 96–112)
Creatinine, Ser: 0.68 mg/dL (ref 0.40–1.20)
Glucose, Bld: 101 mg/dL — ABNORMAL HIGH (ref 70–99)
Potassium: 4.2 mEq/L (ref 3.5–5.3)
Sodium: 137 mEq/L (ref 135–145)
Total Bilirubin: 0.7 mg/dL (ref 0.3–1.2)
Total Protein: 6.7 g/dL (ref 6.0–8.3)

## 2008-10-04 LAB — LACTATE DEHYDROGENASE: LDH: 128 U/L (ref 94–250)

## 2008-10-05 LAB — VITAMIN D 25 HYDROXY (VIT D DEFICIENCY, FRACTURES): Vit D, 25-Hydroxy: 36 ng/mL (ref 30–89)

## 2008-10-05 LAB — CANCER ANTIGEN 27.29: CA 27.29: 8 U/mL (ref 0–39)

## 2008-11-23 ENCOUNTER — Encounter: Admission: RE | Admit: 2008-11-23 | Discharge: 2008-11-23 | Payer: Self-pay | Admitting: Oncology

## 2008-11-26 ENCOUNTER — Ambulatory Visit (HOSPITAL_COMMUNITY): Admission: RE | Admit: 2008-11-26 | Discharge: 2008-11-26 | Payer: Self-pay | Admitting: Oncology

## 2008-12-24 ENCOUNTER — Ambulatory Visit: Payer: Self-pay | Admitting: Internal Medicine

## 2008-12-28 LAB — CONVERTED CEMR LAB
BUN: 9 mg/dL (ref 6–23)
Basophils Absolute: 0 10*3/uL (ref 0.0–0.1)
Basophils Relative: 0.6 % (ref 0.0–3.0)
CO2: 31 meq/L (ref 19–32)
Calcium: 8.6 mg/dL (ref 8.4–10.5)
Chloride: 108 meq/L (ref 96–112)
Cholesterol: 194 mg/dL (ref 0–200)
Creatinine, Ser: 0.7 mg/dL (ref 0.4–1.2)
Direct LDL: 123.6 mg/dL
Eosinophils Absolute: 0.1 10*3/uL (ref 0.0–0.7)
Eosinophils Relative: 2.8 % (ref 0.0–5.0)
Folate: 6.6 ng/mL
GFR calc non Af Amer: 92.85 mL/min (ref >60)
Glucose, Bld: 91 mg/dL (ref 70–99)
HCT: 41.1 % (ref 36.0–46.0)
HDL: 54.1 mg/dL (ref >39.00)
Hemoglobin: 14.5 g/dL (ref 12.0–15.0)
Iron: 125 ug/dL (ref 42–145)
Lymphocytes Relative: 28.8 % (ref 12.0–46.0)
Lymphs Abs: 1.3 10*3/uL (ref 0.7–4.0)
MCHC: 35.3 g/dL (ref 30.0–36.0)
MCV: 92.1 fL (ref 78.0–100.0)
Monocytes Absolute: 0.3 10*3/uL (ref 0.1–1.0)
Monocytes Relative: 6.8 % (ref 3.0–12.0)
Neutro Abs: 2.9 10*3/uL (ref 1.4–7.7)
Neutrophils Relative %: 61 % (ref 43.0–77.0)
Platelets: 201 10*3/uL (ref 150.0–400.0)
Potassium: 4.5 meq/L (ref 3.5–5.1)
RBC: 4.46 M/uL (ref 3.87–5.11)
RDW: 11.7 % (ref 11.5–14.6)
Saturation Ratios: 59.8 % — ABNORMAL HIGH (ref 20.0–50.0)
Sodium: 146 meq/L — ABNORMAL HIGH (ref 135–145)
Transferrin: 149.4 mg/dL — ABNORMAL LOW (ref 212.0–360.0)
Vitamin B-12: >1500 pg/mL — ABNORMAL HIGH (ref 211–911)
WBC: 4.6 10*3/uL (ref 4.5–10.5)

## 2009-03-20 ENCOUNTER — Encounter: Payer: Self-pay | Admitting: Internal Medicine

## 2009-03-26 ENCOUNTER — Ambulatory Visit: Payer: Self-pay | Admitting: Internal Medicine

## 2009-03-26 LAB — CONVERTED CEMR LAB
BUN: 14 mg/dL (ref 6–23)
CO2: 31 meq/L (ref 19–32)
Calcium: 9.7 mg/dL (ref 8.4–10.5)
Chloride: 102 meq/L (ref 96–112)
Creatinine, Ser: 0.8 mg/dL (ref 0.4–1.2)
Folate: 5.2 ng/mL
GFR calc non Af Amer: 79.51 mL/min (ref >60)
Glucose, Bld: 92 mg/dL (ref 70–99)
Iron: 56 ug/dL (ref 42–145)
Potassium: 5.2 meq/L — ABNORMAL HIGH (ref 3.5–5.1)
Saturation Ratios: 21.9 % (ref 20.0–50.0)
Sodium: 141 meq/L (ref 135–145)
Transferrin: 182.7 mg/dL — ABNORMAL LOW (ref 212.0–360.0)
Vitamin B-12: 738 pg/mL (ref 211–911)

## 2009-05-14 ENCOUNTER — Ambulatory Visit: Payer: Self-pay | Admitting: Oncology

## 2009-05-15 ENCOUNTER — Encounter: Payer: Self-pay | Admitting: Internal Medicine

## 2009-05-16 LAB — COMPREHENSIVE METABOLIC PANEL
ALT: 19 U/L (ref 0–35)
AST: 16 U/L (ref 0–37)
Albumin: 4.2 g/dL (ref 3.5–5.2)
Alkaline Phosphatase: 39 U/L (ref 39–117)
BUN: 17 mg/dL (ref 6–23)
CO2: 27 mEq/L (ref 19–32)
Calcium: 9 mg/dL (ref 8.4–10.5)
Chloride: 105 mEq/L (ref 96–112)
Creatinine, Ser: 0.8 mg/dL (ref 0.40–1.20)
Glucose, Bld: 114 mg/dL — ABNORMAL HIGH (ref 70–99)
Potassium: 4.2 mEq/L (ref 3.5–5.3)
Sodium: 142 mEq/L (ref 135–145)
Total Bilirubin: 0.4 mg/dL (ref 0.3–1.2)
Total Protein: 6.2 g/dL (ref 6.0–8.3)

## 2009-05-16 LAB — CBC WITH DIFFERENTIAL/PLATELET
BASO%: 1 % (ref 0.0–2.0)
Basophils Absolute: 0.1 10*3/uL (ref 0.0–0.1)
EOS%: 1.3 % (ref 0.0–7.0)
Eosinophils Absolute: 0.1 10*3/uL (ref 0.0–0.5)
HCT: 40.3 % (ref 34.8–46.6)
HGB: 13.8 g/dL (ref 11.6–15.9)
LYMPH%: 19.4 % (ref 14.0–49.7)
MCH: 31.7 pg (ref 25.1–34.0)
MCHC: 34.3 g/dL (ref 31.5–36.0)
MCV: 92.4 fL (ref 79.5–101.0)
MONO#: 0.5 10*3/uL (ref 0.1–0.9)
MONO%: 7.2 % (ref 0.0–14.0)
NEUT#: 5 10*3/uL (ref 1.5–6.5)
NEUT%: 71.1 % (ref 38.4–76.8)
Platelets: 208 10*3/uL (ref 145–400)
RBC: 4.36 10*6/uL (ref 3.70–5.45)
RDW: 13.2 % (ref 11.2–14.5)
WBC: 7.1 10*3/uL (ref 3.9–10.3)
lymph#: 1.4 10*3/uL (ref 0.9–3.3)

## 2009-05-16 LAB — VITAMIN D 25 HYDROXY (VIT D DEFICIENCY, FRACTURES): Vit D, 25-Hydroxy: 35 ng/mL (ref 30–89)

## 2009-05-16 LAB — CANCER ANTIGEN 27.29: CA 27.29: 8 U/mL (ref 0–39)

## 2009-05-16 LAB — LACTATE DEHYDROGENASE: LDH: 134 U/L (ref 94–250)

## 2009-05-22 ENCOUNTER — Encounter: Payer: Self-pay | Admitting: Internal Medicine

## 2009-07-24 ENCOUNTER — Encounter: Admission: RE | Admit: 2009-07-24 | Discharge: 2009-07-24 | Payer: Self-pay | Admitting: Oncology

## 2009-07-26 ENCOUNTER — Ambulatory Visit (HOSPITAL_COMMUNITY): Admission: RE | Admit: 2009-07-26 | Discharge: 2009-07-26 | Payer: Self-pay | Admitting: Oncology

## 2009-12-06 ENCOUNTER — Ambulatory Visit: Payer: Self-pay | Admitting: Internal Medicine

## 2009-12-06 LAB — CONVERTED CEMR LAB
ALT: 41 units/L — ABNORMAL HIGH (ref 0–35)
AST: 33 units/L (ref 0–37)
Albumin: 4.1 g/dL (ref 3.5–5.2)
Alkaline Phosphatase: 45 units/L (ref 39–117)
BUN: 14 mg/dL (ref 6–23)
Basophils Absolute: 0.1 10*3/uL (ref 0.0–0.1)
Basophils Relative: 1.3 % (ref 0.0–3.0)
Bilirubin Urine: NEGATIVE
Bilirubin, Direct: 0.1 mg/dL (ref 0.0–0.3)
CO2: 31 meq/L (ref 19–32)
Calcium: 9.4 mg/dL (ref 8.4–10.5)
Chloride: 106 meq/L (ref 96–112)
Cholesterol: 213 mg/dL — ABNORMAL HIGH (ref 0–200)
Creatinine, Ser: 0.8 mg/dL (ref 0.4–1.2)
Direct LDL: 127.9 mg/dL
Eosinophils Absolute: 0.1 10*3/uL (ref 0.0–0.7)
Eosinophils Relative: 1.9 % (ref 0.0–5.0)
GFR calc non Af Amer: 80.46 mL/min (ref >60)
Glucose, Bld: 100 mg/dL — ABNORMAL HIGH (ref 70–99)
Glucose, Urine, Semiquant: NEGATIVE
HCT: 42.2 % (ref 36.0–46.0)
HDL: 63.6 mg/dL (ref >39.00)
Hemoglobin: 14.4 g/dL (ref 12.0–15.0)
Ketones, urine, test strip: NEGATIVE
Lymphocytes Relative: 38 % (ref 12.0–46.0)
Lymphs Abs: 1.8 10*3/uL (ref 0.7–4.0)
MCHC: 34.2 g/dL (ref 30.0–36.0)
MCV: 94.3 fL (ref 78.0–100.0)
Monocytes Absolute: 0.4 10*3/uL (ref 0.1–1.0)
Monocytes Relative: 7.6 % (ref 3.0–12.0)
Neutro Abs: 2.4 10*3/uL (ref 1.4–7.7)
Neutrophils Relative %: 51.2 % (ref 43.0–77.0)
Nitrite: NEGATIVE
Platelets: 215 10*3/uL (ref 150.0–400.0)
Potassium: 5 meq/L (ref 3.5–5.1)
RBC: 4.48 M/uL (ref 3.87–5.11)
RDW: 12.9 % (ref 11.5–14.6)
Sodium: 144 meq/L (ref 135–145)
Specific Gravity, Urine: 1.02
TSH: 1.95 microintl units/mL (ref 0.35–5.50)
Total Bilirubin: 0.5 mg/dL (ref 0.3–1.2)
Total CHOL/HDL Ratio: 3
Total Protein: 6.6 g/dL (ref 6.0–8.3)
Triglycerides: 73 mg/dL (ref 0.0–149.0)
Urobilinogen, UA: 0.2
VLDL: 14.6 mg/dL (ref 0.0–40.0)
WBC Urine, dipstick: NEGATIVE
WBC: 4.6 10*3/uL (ref 4.5–10.5)
pH: 7

## 2009-12-20 ENCOUNTER — Ambulatory Visit: Payer: Self-pay | Admitting: Oncology

## 2009-12-24 LAB — CBC WITH DIFFERENTIAL/PLATELET
BASO%: 0.4 % (ref 0.0–2.0)
Basophils Absolute: 0 10*3/uL (ref 0.0–0.1)
EOS%: 1.7 % (ref 0.0–7.0)
Eosinophils Absolute: 0.1 10*3/uL (ref 0.0–0.5)
HCT: 40.5 % (ref 34.8–46.6)
HGB: 13.8 g/dL (ref 11.6–15.9)
LYMPH%: 24.1 % (ref 14.0–49.7)
MCH: 31.7 pg (ref 25.1–34.0)
MCHC: 34.1 g/dL (ref 31.5–36.0)
MCV: 93.1 fL (ref 79.5–101.0)
MONO#: 0.3 10*3/uL (ref 0.1–0.9)
MONO%: 6.5 % (ref 0.0–14.0)
NEUT#: 3.5 10*3/uL (ref 1.5–6.5)
NEUT%: 67.3 % (ref 38.4–76.8)
Platelets: 209 10*3/uL (ref 145–400)
RBC: 4.35 10*6/uL (ref 3.70–5.45)
RDW: 12.4 % (ref 11.2–14.5)
WBC: 5.2 10*3/uL (ref 3.9–10.3)
lymph#: 1.3 10*3/uL (ref 0.9–3.3)

## 2009-12-25 LAB — COMPREHENSIVE METABOLIC PANEL
ALT: 23 U/L (ref 0–35)
AST: 16 U/L (ref 0–37)
Albumin: 4.5 g/dL (ref 3.5–5.2)
Alkaline Phosphatase: 42 U/L (ref 39–117)
BUN: 14 mg/dL (ref 6–23)
CO2: 25 mEq/L (ref 19–32)
Calcium: 9.6 mg/dL (ref 8.4–10.5)
Chloride: 106 mEq/L (ref 96–112)
Creatinine, Ser: 0.76 mg/dL (ref 0.40–1.20)
Glucose, Bld: 109 mg/dL — ABNORMAL HIGH (ref 70–99)
Potassium: 4.2 mEq/L (ref 3.5–5.3)
Sodium: 141 mEq/L (ref 135–145)
Total Bilirubin: 0.5 mg/dL (ref 0.3–1.2)
Total Protein: 6.3 g/dL (ref 6.0–8.3)

## 2009-12-25 LAB — VITAMIN D 25 HYDROXY (VIT D DEFICIENCY, FRACTURES): Vit D, 25-Hydroxy: 36 ng/mL (ref 30–89)

## 2009-12-25 LAB — LACTATE DEHYDROGENASE: LDH: 160 U/L (ref 94–250)

## 2009-12-25 LAB — CANCER ANTIGEN 27.29: CA 27.29: 11 U/mL (ref 0–39)

## 2010-01-07 ENCOUNTER — Ambulatory Visit: Payer: Self-pay | Admitting: Internal Medicine

## 2010-01-07 DIAGNOSIS — B079 Viral wart, unspecified: Secondary | ICD-10-CM

## 2010-01-07 HISTORY — DX: Viral wart, unspecified: B07.9

## 2010-01-24 ENCOUNTER — Ambulatory Visit: Payer: Self-pay | Admitting: Family Medicine

## 2010-01-24 LAB — CONVERTED CEMR LAB
Bilirubin Urine: NEGATIVE
Glucose, Urine, Semiquant: NEGATIVE
Ketones, urine, test strip: NEGATIVE
Nitrite: NEGATIVE
Protein, U semiquant: NEGATIVE
Specific Gravity, Urine: 1.01
Urobilinogen, UA: 0.2
pH: 6.5

## 2010-01-25 ENCOUNTER — Encounter: Payer: Self-pay | Admitting: Internal Medicine

## 2010-03-27 ENCOUNTER — Encounter: Payer: Self-pay | Admitting: Internal Medicine

## 2010-04-21 ENCOUNTER — Telehealth: Payer: Self-pay | Admitting: Internal Medicine

## 2010-05-13 ENCOUNTER — Ambulatory Visit: Payer: Self-pay | Admitting: Internal Medicine

## 2010-05-13 LAB — CONVERTED CEMR LAB
BUN: 16 mg/dL (ref 6–23)
Basophils Absolute: 0 10*3/uL (ref 0.0–0.1)
Basophils Relative: 0.8 % (ref 0.0–3.0)
CO2: 32 meq/L (ref 19–32)
Calcium: 9.2 mg/dL (ref 8.4–10.5)
Chloride: 102 meq/L (ref 96–112)
Creatinine, Ser: 0.8 mg/dL (ref 0.4–1.2)
Eosinophils Absolute: 0.1 10*3/uL (ref 0.0–0.7)
Eosinophils Relative: 1.7 % (ref 0.0–5.0)
GFR calc non Af Amer: 85.3 mL/min (ref >60.00)
Glucose, Bld: 80 mg/dL (ref 70–99)
HCT: 42.5 % (ref 36.0–46.0)
Hemoglobin: 14.3 g/dL (ref 12.0–15.0)
Lymphocytes Relative: 35 % (ref 12.0–46.0)
Lymphs Abs: 1.8 10*3/uL (ref 0.7–4.0)
MCHC: 33.7 g/dL (ref 30.0–36.0)
MCV: 94 fL (ref 78.0–100.0)
Monocytes Absolute: 0.4 10*3/uL (ref 0.1–1.0)
Monocytes Relative: 8.4 % (ref 3.0–12.0)
Neutro Abs: 2.8 10*3/uL (ref 1.4–7.7)
Neutrophils Relative %: 54.1 % (ref 43.0–77.0)
Platelets: 205 10*3/uL (ref 150.0–400.0)
Potassium: 4.6 meq/L (ref 3.5–5.1)
RBC: 4.52 M/uL (ref 3.87–5.11)
RDW: 12.9 % (ref 11.5–14.6)
Sodium: 141 meq/L (ref 135–145)
WBC: 5.1 10*3/uL (ref 4.5–10.5)

## 2010-05-23 ENCOUNTER — Ambulatory Visit
Admission: RE | Admit: 2010-05-23 | Discharge: 2010-05-23 | Payer: Self-pay | Source: Home / Self Care | Attending: Internal Medicine | Admitting: Internal Medicine

## 2010-05-23 DIAGNOSIS — H6990 Unspecified Eustachian tube disorder, unspecified ear: Secondary | ICD-10-CM

## 2010-05-23 DIAGNOSIS — H6980 Other specified disorders of Eustachian tube, unspecified ear: Secondary | ICD-10-CM

## 2010-05-23 HISTORY — DX: Unspecified eustachian tube disorder, unspecified ear: H69.90

## 2010-05-23 HISTORY — DX: Other specified disorders of Eustachian tube, unspecified ear: H69.80

## 2010-06-07 ENCOUNTER — Other Ambulatory Visit: Payer: Self-pay | Admitting: Oncology

## 2010-06-07 DIAGNOSIS — Z9889 Other specified postprocedural states: Secondary | ICD-10-CM

## 2010-06-08 ENCOUNTER — Encounter: Payer: Self-pay | Admitting: Obstetrics and Gynecology

## 2010-06-08 ENCOUNTER — Encounter: Payer: Self-pay | Admitting: Oncology

## 2010-06-15 LAB — CONVERTED CEMR LAB: Pap Smear: NORMAL

## 2010-06-19 NOTE — Progress Notes (Signed)
Summary: rx  Phone Note Call from Patient Call back at Work Phone [phone]   Caller: pt live Call For: Lovell Sheehan Reason for Call: Lab or Test Results Summary of Call: went by [facility] to pick up rx for tamazepam and they said they did not have an rx from you.   Initial call taken by: Roselle Locus,  December 20, 2007 9:09 AM  Follow-up for Phone Call        talked with pharmacist and script is ready for pick upi Follow-up by: Willy Eddy, LPN,  December 20, 2007 9:15 AM

## 2010-06-19 NOTE — Progress Notes (Signed)
Summary: FAXED LAB REPORT TO [facility]   Phone Note From Other Clinic   Caller: [facility] Call For: JENKINS Summary of Call: FAXED LAB REPORT TO [facility]  Initial call taken by: Roselle Locus,  June 28, 2007 4:16 PM

## 2010-06-19 NOTE — Consult Note (Signed)
Summary: Dr Colin Benton note  Dr Colin Benton note   Imported By: Kassie Mends 08/23/2007 12:15:04  _____________________________________________________________________  External Attachment:    Type:   Image     Comment:   Dr Colin Benton note

## 2010-06-19 NOTE — Letter (Signed)
Summary: [facility]   Imported By: Maryln Gottron 02/03/2008 15:46:12  _____________________________________________________________________  External Attachment:    Type:   Image     Comment:   External Document

## 2010-06-19 NOTE — Progress Notes (Signed)
Summary: breast biopsy freaking out  Phone Note Call from Patient Call back at [phone]   Call For: [facility] Summary of Call: For breast biopsy Thurs. outpatient awake.  Durenda Age out.  Can you call in something to help with her nerves & morning of procedure?  [facility].  NKDA. Initial call taken by: Rudy Jew, RN,  July 29, 2007 10:07 AM  Follow-up for Phone Call        xanax .5 one by mouth q 8 hour as needed number 20 Follow-up by: Stacie Glaze MD,  July 29, 2007 2:09 PM  Additional Follow-up for Phone Call Additional follow up Details #1::        called to the pharmacy and pt notified. Additional Follow-up by: Lynann Beaver CMA,  July 29, 2007 2:24 PM

## 2010-06-19 NOTE — Assessment & Plan Note (Signed)
Summary: 3 month fup//ccm/PT RESCD FROM BUMP//CCM   Vital Signs:  Patient profile:   55 year old female Height:      62 inches Weight:      132 pounds BMI:     24.23 Temp:     98.2 degrees F oral Pulse rate:   76 / minute Resp:     14 per minute BP sitting:   130 / 80  Vitals Entered By: Willy Eddy, LPN (December 24, 2008 9:28 AM)  CC:  roa.  History of Present Illness: no further evidence of blood loss and energy levels have  improved has not been consistant with the use of the prilosec hx of breast cancer and has been placed on foxamax by Dr Donnie Coffin  Problems Prior to Update: 1)  Bariatric Surgery Status  (ICD-V45.86) 2)  Special Screening For Malignant Neoplasms Colon  (ICD-V76.51) 3)  Cystitis, Hemorrhagic  (ICD-595.0) 4)  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) 5)  Weight Loss, Recent  (ICD-783.21) 6)  Neck Mass  (ICD-784.2) 7)  Low Back Pain  (ICD-724.2) 8)  Allergic Rhinitis  (ICD-477.9) 9)  Hyperlipidemia  (ICD-272.4) 10)  Gerd  (ICD-530.81) 11)  Depression  (ICD-311)  Medications Prior to Update: 1)  Vitamin D 1000 Unit Caps (Cholecalciferol) .Marland Kitchen.. 1 Once Daily 2)  Femara 2.5 Mg Tabs (Letrozole) .... Use As Directed 3)  Omeprazole 20 Mg Cpdr (Omeprazole) .... One By Mouth Daily  Current Medications (verified): 1)  Vitamin D 1000 Unit Caps (Cholecalciferol) .Marland Kitchen.. 1 Once Daily 2)  Femara 2.5 Mg Tabs (Letrozole) .... Use As Directed 3)  Omeprazole 20 Mg Cpdr (Omeprazole) .... One By Mouth Daily 4)  Fosamax 70 Mg Tabs (Alendronate Sodium) .Marland Kitchen.. 1 Q Week 5)  Vitamin B-12 100 Mcg Tabs (Cyanocobalamin) .Marland Kitchen.. 1 Once Daily 6)  Calcium 500 Mg Tabs (Calcium Carbonate) .Marland Kitchen.. 1 Once Daily  Allergies: No Known Drug Allergies  Past History:  Family History: Last updated: 11/12/2006 Family History Other cancer-Breast Fam hx MI Family History of Cardiovascular disorder Family History Lung cancer Family History Hypertension  Social History: Last  updated: 11/12/2006 Married Never Smoked Alcohol use-no  Risk Factors: Smoking Status: never (11/12/2006)  Past medical, surgical, family and social histories (including risk factors) reviewed, and no changes noted (except as noted below).  Past Medical History: Reviewed history from 02/03/2007 and no changes required. Depression GERD Hyperlipidemia Hypertension Allergic rhinitis Low back pain  Past Surgical History: Reviewed history from 11/12/2006 and no changes required. 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 Family History Other cancer-Breast Fam hx MI Family History of Cardiovascular disorder Family History Lung cancer Family History Hypertension  Social History: Reviewed history from 11/12/2006 and no changes required. Married Never Smoked Alcohol use-no  Review of Systems  The patient denies anorexia, fever, weight loss, weight gain, vision loss, decreased hearing, hoarseness, chest pain, syncope, dyspnea on exertion, peripheral edema, prolonged cough, headaches, hemoptysis, abdominal pain, melena, hematochezia, severe indigestion/heartburn, hematuria, incontinence, genital sores, muscle weakness, suspicious skin lesions, transient blindness, difficulty walking, depression, unusual weight change, abnormal bleeding, enlarged lymph nodes, angioedema, and breast masses.    Physical Exam  General:  Well-developed,well-nourished,in no acute distress; alert,appropriate and cooperative throughout examination Head:  Normocephalic and atraumatic without obvious abnormalities. No apparent alopecia or balding. Eyes:  pupils equal and pupils round.   Ears:  External ear exam shows no significant lesions or deformities.  Otoscopic examination reveals clear canals,  tympanic membranes are intact bilaterally without bulging, retraction, inflammation or discharge. Hearing is grossly normal bilaterally. Nose:  External  nasal examination shows no deformity or inflammation. Nasal mucosa are pink and moist without lesions or exudates. Mouth:  Oral mucosa and oropharynx without lesions or exudates.  Teeth in good repair. Neck:  supple.   Lungs:  normal respiratory effort, no dullness, and no wheezes.   Heart:  normal rate and regular rhythm.   Abdomen:  soft, non-tender, and no masses.   Msk:  no joint tenderness, no joint swelling, and no joint warmth.   Pulses:  R and L carotid,radial,femoral,dorsalis pedis and posterior tibial pulses are full and equal bilaterally Extremities:  No clubbing, cyanosis, edema, or deformity noted with normal full range of motion of all joints.   Neurologic:  alert & oriented X3 and cranial nerves II-XII intact.   Skin:  loose skin on aarms and abd   Impression & Recommendations:  Problem # 1:  BARIATRIC SURGERY STATUS (ICD-V45.86) Assessment Unchanged monitering for nutritional problems of Orders: Venipuncture (40981) TLB-BMP (Basic Metabolic Panel-BMET) (80048-METABOL) TLB-CBC Platelet - w/Differential (85025-CBCD) TLB-IBC Pnl (Iron/FE;Transferrin) (83550-IBC) TLB-B12 + Folate Pnl (82746_82607-B12/FOL)  Problem # 2:  IRON DEFICIENCY ANEMIA SECONDARY TO BLOOD LOSS (ICD-280.0) Assessment: Unchanged  had ulcer dz Her updated medication list for this problem includes:    Vitamin B-12 100 Mcg Tabs (Cyanocobalamin) .Marland Kitchen... 1 once daily  Orders: TLB-CBC Platelet - w/Differential (85025-CBCD) TLB-IBC Pnl (Iron/FE;Transferrin) (83550-IBC)  Hgb: 13.5 (08/22/2008)   Hct: 39.9 (08/22/2008)   Platelets: 223.0 (08/22/2008) RBC: 4.29 (08/22/2008)   RDW: 11.4 (08/22/2008)   WBC: 5.1 (08/22/2008) MCV: 92.9 (08/22/2008)   MCHC: 33.8 (08/22/2008) Iron: 37 (09/19/2008)   % Sat: 15.0 (09/19/2008) B12: >1500 pg/mL (08/22/2008)   Folate: 6.7 (08/22/2008)     Problem # 3:  HYPERLIPIDEMIA (ICD-272.4) Assessment: Unchanged  Orders: TLB-Cholesterol, HDL (83718-HDL) TLB-Cholesterol,  Direct LDL (83721-DIRLDL) TLB-Cholesterol, Total (82465-CHO)  Labs Reviewed: SGOT: 15 (10/14/2006)   SGPT: 17 (10/14/2006)  Lipid Goals: Chol Goal: 200 (05/09/2007)   HDL Goal: 40 (05/09/2007)   LDL Goal: 160 (05/09/2007)   TG Goal: 150 (05/09/2007)  Prior 10 Yr Risk Heart Disease: Not enough information (02/03/2007)  Complete Medication List: 1)  Vitamin D 1000 Unit Caps (Cholecalciferol) .Marland Kitchen.. 1 once daily 2)  Femara 2.5 Mg Tabs (Letrozole) .... Use as directed 3)  Omeprazole 20 Mg Cpdr (Omeprazole) .... One by mouth daily 4)  Fosamax 70 Mg Tabs (Alendronate sodium) .Marland Kitchen.. 1 q week 5)  Vitamin B-12 100 Mcg Tabs (Cyanocobalamin) .Marland Kitchen.. 1 once daily 6)  Calcium 500 Mg Tabs (Calcium carbonate) .Marland Kitchen.. 1 once daily  Patient Instructions: 1)  Please schedule a follow-up appointment in 3 months.

## 2010-06-19 NOTE — Progress Notes (Signed)
  Phone Note Call from Patient   Caller: Patient Summary of Call: Patient called early this morning stating she noted blood in urine. Patient not having any significant discomfort. no complaints of fever, vomiting, or diarrhea.  Advise patient since symptom not severe, would wait to call the office this morning to make an appt. If significant blood in urine or other symptoms, would recommend ED.

## 2010-06-19 NOTE — Progress Notes (Signed)
  Phone Note Call from Patient Call back at [phone] cell   Caller: Patient Call For: Lovell Sheehan Reason for Call: Talk to Doctor, Lab or Test Results Summary of Call: pt had a CT scan on 9/22 calling for results pls call (cell) Initial call taken by: Shan Levans,  February 09, 2007 2:41 PM  Follow-up for Phone Call        told pt of ct of neck.WHat does she do now becuase it is still sore and hurts to move neck ..................................................................Marland KitchenWilly Eddy, LPN  February 10, 2007 9:20 AM   Additional Follow-up for Phone Call Additional follow up Details #1::        it is musculoskeletal not a mass... use heat and ice and use of antiflamatory drugs such as motrin Additional Follow-up by: Stacie Glaze MD,  February 10, 2007 3:41 PM    Additional Follow-up for Phone Call Additional follow up Details #2::    pt informed ..................................................................Marland KitchenWilly Eddy, LPN  February 10, 2007 3:51 PM

## 2010-06-19 NOTE — Assessment & Plan Note (Signed)
Summary: 3 month follow up/cjr   Vital Signs:  Patient profile:   55 year old female Height:      62 inches Weight:      130 pounds BMI:     23.86 Temp:     98.2 degrees F oral Pulse rate:   72 / minute Resp:     14 per minute BP sitting:   130 / 80  (left arm)  Vitals Entered By: Willy Eddy, LPN (March 26, 2009 3:39 PM)  CC:  ROA.  History of Present Illness: follow up the GERD with the hx of obesity surgery monitering b12 iron and cbc as well as calcium   Follow-Up Visit      This is a 55 year old woman who presents for Follow-up visit.  The patient denies chest pain, palpitations, dizziness, syncope, low blood sugar symptoms, high blood sugar symptoms, edema, SOB, DOE, PND, and orthopnea.  Since the last visit the patient notes no new problems or concerns.    Problems Prior to Update: 1)  Bariatric Surgery Status  (ICD-V45.86) 2)  Special Screening For Malignant Neoplasms Colon  (ICD-V76.51) 3)  Cystitis, Hemorrhagic  (ICD-595.0) 4)  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) 5)  Weight Loss, Recent  (ICD-783.21) 6)  Neck Mass  (ICD-784.2) 7)  Low Back Pain  (ICD-724.2) 8)  Allergic Rhinitis  (ICD-477.9) 9)  Hyperlipidemia  (ICD-272.4) 10)  Gerd  (ICD-530.81) 11)  Depression  (ICD-311)  Medications Prior to Update: 1)  Vitamin D 1000 Unit Caps (Cholecalciferol) .Marland Kitchen.. 1 Once Daily 2)  Femara 2.5 Mg Tabs (Letrozole) .... Use As Directed 3)  Omeprazole 20 Mg Cpdr (Omeprazole) .... One By Mouth Daily 4)  Fosamax 70 Mg Tabs (Alendronate Sodium) .Marland Kitchen.. 1 Q Week 5)  Vitamin B-12 100 Mcg Tabs (Cyanocobalamin) .Marland Kitchen.. 1 Once Daily 6)  Calcium 500 Mg Tabs (Calcium Carbonate) .Marland Kitchen.. 1 Once Daily  Current Medications (verified): 1)  Vitamin D 2000 Unit Caps (Cholecalciferol) .Marland Kitchen.. 1 Once Daily 2)  Femara 2.5 Mg Tabs (Letrozole) .... Use As Directed 3)  Omeprazole 20 Mg Cpdr (Omeprazole) .... One By Mouth Daily 4)  Fosamax 70 Mg Tabs (Alendronate Sodium)  .Marland Kitchen.. 1 Q Week 5)  Vitamin B-12 100 Mcg Tabs (Cyanocobalamin) .Marland Kitchen.. 1 Once Daily 6)  Calcium 500 Mg Tabs (Calcium Carbonate) .Marland Kitchen.. 1 Once Daily  Allergies (verified): No Known Drug Allergies  Past History:  Family History: Last updated: 11/12/2006 Family History Other cancer-Breast Fam hx MI Family History of Cardiovascular disorder Family History Lung cancer Family History Hypertension  Social History: Last updated: 11/12/2006 Married Never Smoked Alcohol use-no  Risk Factors: Smoking Status: never (11/12/2006)  Past medical, surgical, family and social histories (including risk factors) reviewed, and no changes noted (except as noted below).  Past Medical History: Reviewed history from 02/03/2007 and no changes required. Depression GERD Hyperlipidemia Hypertension Allergic rhinitis Low back pain  Past Surgical History: Reviewed history from 11/12/2006 and no changes required. 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 Family History Other cancer-Breast Fam hx MI Family History of Cardiovascular disorder Family History Lung cancer Family History Hypertension  Social History: Reviewed history from 11/12/2006 and no changes required. Married Never Smoked Alcohol use-no  Review of Systems  The patient denies anorexia, fever, weight loss, weight gain, vision loss, decreased hearing, hoarseness, chest pain, syncope, dyspnea on exertion, peripheral edema, prolonged cough, headaches, hemoptysis, abdominal pain, melena, hematochezia, severe indigestion/heartburn, hematuria,  incontinence, genital sores, muscle weakness, suspicious skin lesions, transient blindness, difficulty walking, depression, unusual weight change, abnormal bleeding, enlarged lymph nodes, angioedema, and breast masses.    Physical Exam  General:  Well-developed,well-nourished,in no acute distress; alert,appropriate and cooperative  throughout examination Head:  Normocephalic and atraumatic without obvious abnormalities. No apparent alopecia or balding. Eyes:  pupils equal and pupils round.   Ears:  External ear exam shows no significant lesions or deformities.  Otoscopic examination reveals clear canals, tympanic membranes are intact bilaterally without bulging, retraction, inflammation or discharge. Hearing is grossly normal bilaterally. Nose:  External nasal examination shows no deformity or inflammation. Nasal mucosa are pink and moist without lesions or exudates. Mouth:  Oral mucosa and oropharynx without lesions or exudates.  Teeth in good repair. Neck:  supple.   Lungs:  normal respiratory effort, no dullness, and no wheezes.   Heart:  normal rate and regular rhythm.   Msk:  no joint tenderness, no joint swelling, and no joint warmth.   Pulses:  R and L carotid,radial,femoral,dorsalis pedis and posterior tibial pulses are full and equal bilaterally Extremities:  No clubbing, cyanosis, edema, or deformity noted with normal full range of motion of all joints.   Neurologic:  alert & oriented X3 and cranial nerves II-XII intact.     Impression & Recommendations:  Problem # 1:  BARIATRIC SURGERY STATUS (ICD-V45.86) Assessment Unchanged  monitering blood work  Orders: TLB-B12 + Folate Pnl (01093_23557-D22/GUR) TLB-Calcium (82310-CA)  Problem # 2:  IRON DEFICIENCY ANEMIA SECONDARY TO BLOOD LOSS (ICD-280.0) Assessment: Unchanged  Her updated medication list for this problem includes:    Vitamin B-12 100 Mcg Tabs (Cyanocobalamin) .Marland Kitchen... 1 once daily  Hgb: 14.5 (12/24/2008)   Hct: 41.1 (12/24/2008)   Platelets: 201.0 (12/24/2008) RBC: 4.46 (12/24/2008)   RDW: 11.7 (12/24/2008)   WBC: 4.6 (12/24/2008) MCV: 92.1 (12/24/2008)   MCHC: 35.3 (12/24/2008) Iron: 125 (12/24/2008)   % Sat: 59.8 (12/24/2008) B12: >1500 pg/mL (12/24/2008)   Folate: 6.6 (12/24/2008)     Orders: Venipuncture (42706) TLB-BMP (Basic  Metabolic Panel-BMET) (80048-METABOL) TLB-IBC Pnl (Iron/FE;Transferrin) (83550-IBC)  Complete Medication List: 1)  Vitamin D 2000 Unit Caps (cholecalciferol)  .Marland Kitchen.. 1 once daily 2)  Femara 2.5 Mg Tabs (Letrozole) .... Use as directed 3)  Omeprazole 20 Mg Cpdr (Omeprazole) .... One by mouth daily 4)  Fosamax 70 Mg Tabs (Alendronate sodium) .Marland Kitchen.. 1 q week 5)  Vitamin B-12 100 Mcg Tabs (Cyanocobalamin) .Marland Kitchen.. 1 once daily 6)  Calcium 500 Mg Tabs (Calcium carbonate) .Marland Kitchen.. 1 once daily  Patient Instructions: 1)  Please schedule a follow-up appointment in 6 months.   CPX

## 2010-06-19 NOTE — Letter (Signed)
[facility]   Imported By: Maryln Gottron 07/04/2009 13:44:57  _____________________________________________________________________  External Attachment:    Type:   Image     Comment:   External Document

## 2010-06-19 NOTE — Progress Notes (Signed)
Summary: med question  Phone Note Call from Patient   Caller: Patient Call For: Stacie Glaze MD Summary of Call: Pt calls stating she cannot afford Nexium.  Would like a substitute if possible. [phone] [facility])  Initial call taken by: Lynann Beaver CMA,  Sep 24, 2008 4:05 PM  Follow-up for Phone Call        generic omperizole sent to pharmacy Follow-up by: Stacie Glaze MD,  Sep 24, 2008 4:40 PM    New/Updated Medications: OMEPRAZOLE 20 MG CPDR (OMEPRAZOLE) one by mouth daily   Prescriptions: OMEPRAZOLE 20 MG CPDR (OMEPRAZOLE) one by mouth daily  #30 x 11   Entered and Authorized by:   Stacie Glaze MD   Signed by:   Stacie Glaze MD on 09/24/2008   Method used:   Electronically to        [facility]. [phone]* (retail)       [address]       [place], [place]  [address]       Ph: [phone]       Fax: [phone]   RxID:   [id number]  Pt notified.

## 2010-06-19 NOTE — Letter (Signed)
Summary: [facility]  [facility]   Imported By: Maryln Gottron 04/20/2008 14:45:14  _____________________________________________________________________  External Attachment:    Type:   Image     Comment:   External Document

## 2010-06-19 NOTE — Medication Information (Signed)
Summary: Approval for Omeprazole/[facility]  Approval for Omeprazole/[facility]   Imported By: Maryln Gottron 03/25/2009 15:30:51  _____________________________________________________________________  External Attachment:    Type:   Image     Comment:   External Document

## 2010-06-19 NOTE — Assessment & Plan Note (Signed)
Summary: cpx/no pap/njr---PT RSC (BMP) // RS/PT [facility] FROM BMP/CJR/pt re...   Vital Signs:  Patient profile:   55 year old female Height:      62 inches Weight:      132 pounds BMI:     24.23 Temp:     98.2 degrees F oral Pulse rate:   68 / minute Resp:     12 per minute BP sitting:   122 / 78  (left arm)  Vitals Entered By: Willy Eddy, LPN (January 07, 2010 3:16 PM) CC: cpx, Lipid Management Is Patient Diabetic? No   CC:  cpx and Lipid Management.  History of Present Illness: The pt was asked about all immunizations, health maint. services that are appropriate to their age and was given guidance on diet exercize  and weight management   Lipid Management History:      Positive NCEP/ATP III risk factors include hypertension.  Negative NCEP/ATP III risk factors include female age less than 36 years old, HDL cholesterol greater than 60, non-tobacco-user status, no ASHD (atherosclerotic heart disease), no prior stroke/TIA, no peripheral vascular disease, and no history of aortic aneurysm.     Preventive Screening-Counseling & Management  Alcohol-Tobacco     Smoking Status: never     Tobacco Counseling: not indicated; no tobacco use  Problems Prior to Update: 1)  Bariatric Surgery Status  (ICD-V45.86) 2)  Special Screening For Malignant Neoplasms Colon  (ICD-V76.51) 3)  Cystitis, Hemorrhagic  (ICD-595.0) 4)  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) 5)  Weight Loss, Recent  (ICD-783.21) 6)  Neck Mass  (ICD-784.2) 7)  Low Back Pain  (ICD-724.2) 8)  Allergic Rhinitis  (ICD-477.9) 9)  Hyperlipidemia  (ICD-272.4) 10)  Gerd  (ICD-530.81) 11)  Depression  (ICD-311)  Medications Prior to Update: 1)  Vitamin D 2000 Unit Caps (Cholecalciferol) .Marland Kitchen.. 1 Once Daily 2)  Femara 2.5 Mg Tabs (Letrozole) .... Use As Directed 3)  Omeprazole 20 Mg Cpdr (Omeprazole) .... One By Mouth Daily 4)  Fosamax 70 Mg Tabs (Alendronate Sodium) .Marland Kitchen.. 1 Q Week 5)  Vitamin B-12 100  Mcg Tabs (Cyanocobalamin) .Marland Kitchen.. 1 Once Daily 6)  Calcium 500 Mg Tabs (Calcium Carbonate) .Marland Kitchen.. 1 Once Daily  Current Medications (verified): 1)  Vitamin D 2000 Unit Caps (Cholecalciferol) .Marland Kitchen.. 1 Once Daily 2)  Femara 2.5 Mg Tabs (Letrozole) .... Use As Directed 3)  Omeprazole 20 Mg Cpdr (Omeprazole) .... One By Mouth Daily 4)  Fosamax 70 Mg Tabs (Alendronate Sodium) .Marland Kitchen.. 1 Q Week 5)  Vitamin B-12 100 Mcg Tabs (Cyanocobalamin) .Marland Kitchen.. 1 Once Daily 6)  Calcium 500 Mg Tabs (Calcium Carbonate) .Marland Kitchen.. 1 Once Daily  Allergies (verified): No Known Drug Allergies  Past History:  Family History: Last updated: 11/12/2006 Family History Other cancer-Breast Fam hx MI Family History of Cardiovascular disorder Family History Lung cancer Family History Hypertension  Social History: Last updated: 11/12/2006 Married Never Smoked Alcohol use-no  Risk Factors: Smoking Status: never (01/07/2010)  Past medical, surgical, family and social histories (including risk factors) reviewed, and no changes noted (except as noted below).  Past Medical History: Reviewed history from 02/03/2007 and no changes required. Depression GERD Hyperlipidemia Hypertension Allergic rhinitis Low back pain  Past Surgical History: Reviewed history from 11/12/2006 and no changes required. 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 Family History Other cancer-Breast Fam hx MI Family History of Cardiovascular disorder Family History Lung cancer Family History Hypertension  Social History: Reviewed  history from 11/12/2006 and no changes required. Married Never Smoked Alcohol use-no  Review of Systems  The patient denies anorexia, fever, weight loss, weight gain, vision loss, decreased hearing, hoarseness, chest pain, syncope, dyspnea on exertion, peripheral edema, prolonged cough, headaches, hemoptysis, abdominal pain, melena, hematochezia, severe  indigestion/heartburn, hematuria, incontinence, genital sores, muscle weakness, suspicious skin lesions, transient blindness, difficulty walking, depression, unusual weight change, abnormal bleeding, enlarged lymph nodes, angioedema, and breast masses.    Physical Exam  General:  Well-developed,well-nourished,in no acute distress; alert,appropriate and cooperative throughout examination Head:  Normocephalic and atraumatic without obvious abnormalities. No apparent alopecia or balding. Eyes:  pupils equal and pupils round.   Ears:  External ear exam shows no significant lesions or deformities.  Otoscopic examination reveals clear canals, tympanic membranes are intact bilaterally without bulging, retraction, inflammation or discharge. Hearing is grossly normal bilaterally. Nose:  External nasal examination shows no deformity or inflammation. Nasal mucosa are pink and moist without lesions or exudates. Mouth:  Oral mucosa and oropharynx without lesions or exudates.  Teeth in good repair. Neck:  supple.   Lungs:  normal respiratory effort, no dullness, and no wheezes.   Heart:  normal rate and regular rhythm.   Abdomen:  soft, non-tender, and no masses.   Msk:  no joint tenderness, no joint swelling, and no joint warmth.   Pulses:  R and L carotid,radial,femoral,dorsalis pedis and posterior tibial pulses are full and equal bilaterally Extremities:  No clubbing, cyanosis, edema, or deformity noted with normal full range of motion of all joints.   Neurologic:  alert & oriented X3 and cranial nerves II-XII intact.     Impression & Recommendations:  Problem # 1:  PREVENTIVE HEALTH CARE (ICD-V70.0) Assessment Unchanged The pt was asked about all immunizations, health maint. services that are appropriate to their age and was given guidance on diet exercize  and weight management  Orders: EKG w/ Interpretation (93000)  Mammogram: normal (08/15/2009) Pap smear: normal (12/31/2009) Colonoscopy:  normal (01/13/2008) Td Booster: Historical (05/18/1993)   Chol: 213 (12/06/2009)   HDL: 63.60 (12/06/2009)   TG: 73.0 (12/06/2009) TSH: 1.95 (12/06/2009)   Next mammogram due:: 08/2010 (01/07/2010) Next Colonoscopy due:: 01/2015 (01/07/2010)  Discussed using sunscreen, use of alcohol, drug use, self breast exam, routine dental care, routine eye care, schedule for GYN exam, routine physical exam, seat belts, multiple vitamins, osteoporosis prevention, adequate calcium intake in diet, recommendations for immunizations, mammograms and Pap smears.  Discussed exercise and checking cholesterol.  Discussed gun safety, safe sex, and contraception.  Problem # 2:  HYPERLIPIDEMIA (ICD-272.4) Assessment: Deteriorated  Labs Reviewed: SGOT: 33 (12/06/2009)   SGPT: 41 (12/06/2009)  Lipid Goals: Chol Goal: 200 (05/09/2007)   HDL Goal: 40 (05/09/2007)   LDL Goal: 160 (05/09/2007)   TG Goal: 150 (05/09/2007)  Prior 10 Yr Risk Heart Disease: Not enough information (02/03/2007)   HDL:63.60 (12/06/2009), 54.10 (12/24/2008)  Chol:213 (12/06/2009), 194 (12/24/2008)  Trig:73.0 (12/06/2009)  Problem # 3:  GERD (ICD-530.81) Assessment: Unchanged refill medication Her updated medication list for this problem includes:    Omeprazole 20 Mg Cpdr (Omeprazole) ..... One by mouth daily  Labs Reviewed: Hgb: 14.4 (12/06/2009)   Hct: 42.2 (12/06/2009)  Problem # 4:  WARTS, HAND (ICD-078.10) Assessment: Deteriorated  wart on right index finger the lesion was identifies as a   wart       and 40 seconds of cryotherapy with the liguid nitrogen gun was apllied to the site. The pt tolerated the procedure and post procedure  care was discussed  Orders: Cryotherapy/Destruction benign or premalignant lesion (1st lesion)  (17000)  Complete Medication List: 1)  Vitamin D 2000 Unit Caps (cholecalciferol)  .Marland Kitchen.. 1 once daily 2)  Femara 2.5 Mg Tabs (Letrozole) .... Use as directed 3)  Omeprazole 20 Mg Cpdr (Omeprazole) .... One  by mouth daily 4)  Fosamax 70 Mg Tabs (Alendronate sodium) .Marland Kitchen.. 1 q week 5)  Vitamin B-12 100 Mcg Tabs (Cyanocobalamin) .Marland Kitchen.. 1 once daily 6)  Calcium 500 Mg Tabs (Calcium carbonate) .Marland Kitchen.. 1 once daily  Lipid Assessment/Plan:      Based on NCEP/ATP III, the patient's risk factor category is "0-1 risk factors".  The patient's lipid goals are as follows: Total cholesterol goal is 200; LDL cholesterol goal is 160; HDL cholesterol goal is 40; Triglyceride goal is 150.  Her LDL cholesterol goal has been met.    Patient Instructions: 1)  Please schedule a follow-up appointment in 4 months. 2)  BMP prior to visit, ICD-9:995.20 3)  CBC w/ Diff prior to visit, ICD-9 280.9 Prescriptions: OMEPRAZOLE 20 MG CPDR (OMEPRAZOLE) one by mouth daily  #30 x 11   Entered and Authorized by:   Stacie Glaze MD   Signed by:   Stacie Glaze MD on 01/07/2010   Method used:   Electronically to        [facility]. [phone]* (retail)       [address]       [place], [place]  [address]       Ph: [phone]       Fax: [phone]   RxID:   [id number]    Preventive Care Screening  Colonoscopy:    Date:  01/13/2008    Next Due:  01/2015    Results:  normal   Mammogram:    Date:  08/15/2009    Next Due:  08/2010    Results:  normal   Pap Smear:    Date:  12/31/2009    Next Due:  01/2011    Results:  normal

## 2010-06-19 NOTE — Consult Note (Signed)
Summary: [facility]  [facility] [place]   Imported By: Lanelle Bal 11/25/2007 10:41:59  _____________________________________________________________________  External Attachment:    Type:   Image     Comment:   External Document

## 2010-06-19 NOTE — Letter (Signed)
Summary: [facility]  [facility]   Imported By: Maryln Gottron 05/20/2009 13:53:24  _____________________________________________________________________  External Attachment:    Type:   Image     Comment:   External Document

## 2010-06-19 NOTE — Consult Note (Signed)
Summary: Dr Colin Benton note  Dr Colin Benton note   Imported By: Kassie Mends 10/06/2007 15:35:06  _____________________________________________________________________  External Attachment:    Type:   Image     Comment:   Dr Colin Benton note

## 2010-06-19 NOTE — Assessment & Plan Note (Signed)
Summary: blood in urine/mhf coming in to give sample    History of Present Illness: awoke to see gross hematurina and buring for 24 hours  Current Allergies (reviewed today): No known allergies  Updated/Current Medications (including changes made in today's visit):  TEMAZEPAM 30 MG CAPS (TEMAZEPAM) as needed CLARINEX-D 24 HOUR 5-240 MG  TB24 (DESLORATADINE-PSEUDOEPHEDRINE) one by mouth daily;PRN FERROUS FUMARATE 324 MG  TABS (FERROUS FUMARATE) one by mouth daily RESTORIL 15 MG  CAPS (TEMAZEPAM) one by mouth q hs LEVAQUIN 750 MG  TABS (LEVOFLOXACIN) one by mouth for 3 days   Past Medical History:    Reviewed history from 02/03/2007 and no changes required:       Depression       GERD       Hyperlipidemia       Hypertension       Allergic rhinitis       Low back pain   Family History:    Reviewed history from 11/12/2006 and no changes required:       Family History Other cancer-Breast       Fam hx MI       Family History of Cardiovascular disorder       Family History Lung cancer       Family History Hypertension  Social History:    Reviewed history from 11/12/2006 and no changes required:       Married       Never Smoked       Alcohol use-no    Review of Systems       The patient complains of hematuria.  The patient denies anorexia, fever, weight loss, vision loss, decreased hearing, hoarseness, chest pain, syncope, dyspnea on exhertion, peripheral edema, prolonged cough, hemoptysis, and abdominal pain.     Physical Exam  General:     Well-developed,well-nourished,in no acute distress; alert,appropriate and cooperative throughout examination Head:     Normocephalic and atraumatic without obvious abnormalities. No apparent alopecia or balding. Ears:     External ear exam shows no significant lesions or deformities.  Otoscopic examination reveals clear canals, tympanic membranes are intact bilaterally without bulging, retraction, inflammation or discharge. Hearing  is grossly normal bilaterally. Mouth:     Oral mucosa and oropharynx without lesions or exudates.  Teeth in good repair. Neck:     supple.   Lungs:     normal respiratory effort, no dullness, and no wheezes.   Heart:     normal rate and regular rhythm.   Abdomen:     soft, non-tender, and no masses.      Impression & Recommendations:  Problem # 1:  CYSTITIS, HEMORRHAGIC (ICD-595.0)  Her updated medication list for this problem includes:    Levaquin 750 Mg Tabs (Levofloxacin) ..... One by mouth for 3 days  Orders: UA Dipstick w/o Micro (81002) T-Culture, Urine ([id number])   Problem # 2:  LOW BACK PAIN (ICD-724.2) Discussed use of moist heat or ice, modified activities, medications, and stretching/strengthening exercises. Back care instructions given. To be seen in 2 weeks if no improvement; sooner if worsening of symptoms.   Complete Medication List: 1)  Temazepam 30 Mg Caps (Temazepam) .... As needed 2)  Clarinex-d 24 Hour 5-240 Mg Tb24 (Desloratadine-pseudoephedrine) .... One by mouth daily;prn 3)  Ferrous Fumarate 324 Mg Tabs (Ferrous fumarate) .... One by mouth daily 4)  Restoril 15 Mg Caps (Temazepam) .... One by mouth q hs 5)  Levaquin 750 Mg Tabs (Levofloxacin) .... One by mouth for  3 days   Patient Instructions: 1)  Take your antibiotic as prescribed until ALL of it is gone, but stop if you develop a rash or swelling and contact our office as soon as possible.    ]  Appended Document: Orders Update    Clinical Lists Changes  Observations: Added new observation of COMMENTS: ..................................................................Marland KitchenWynona Canes, CMA  June 03, 2007 4:15 PM  (06/03/2007 16:13) Added new observation of PH URINE: 5.5  (06/03/2007 16:13) Added new observation of SPEC GR URIN: >=1.030  (06/03/2007 16:13) Added new observation of APPEARANCE U: Clear  (06/03/2007 16:13) Added new observation of UA COLOR: yellow  (06/03/2007  16:13) Added new observation of WBC DIPSTK U: 1+  (06/03/2007 16:13) Added new observation of NITRITE URN: negative  (06/03/2007 16:13) Added new observation of UROBILINOGEN: 0.2  (06/03/2007 16:13) Added new observation of PROTEIN, URN: 1+  (06/03/2007 16:13) Added new observation of BLOOD UR DIP: 2+  (06/03/2007 16:13) Added new observation of KETONES URN: negative  (06/03/2007 16:13) Added new observation of BILIRUBIN UR: negative  (06/03/2007 16:13) Added new observation of GLUCOSE, URN: negative  (06/03/2007 16:13)       Laboratory Results   Urine Tests   Date/Time Reported: June 03, 2007 4:15 PM   Routine Urinalysis   Color: yellow Appearance: Clear Glucose: negative   (Normal Range: Negative) Bilirubin: negative   (Normal Range: Negative) Ketone: negative   (Normal Range: Negative) Spec. Gravity: >=1.030   (Normal Range: 1.003-1.035) Blood: 2+   (Normal Range: Negative) pH: 5.5   (Normal Range: 5.0-8.0) Protein: 1+   (Normal Range: Negative) Urobilinogen: 0.2   (Normal Range: 0-1) Nitrite: negative   (Normal Range: Negative) Leukocyte Esterace: 1+   (Normal Range: Negative)    Comments: ..................................................................Marland KitchenWynona Canes, CMA  June 03, 2007 4:15 PM

## 2010-06-19 NOTE — Assessment & Plan Note (Signed)
Summary: roa/db/PT RESCD/CCM   Vital Signs:  Patient Profile:   55 Years Old Female Height:     62 inches Weight:      130 pounds Temp:     98.6 degrees F oral Pulse rate:   72 / minute Resp:     12 per minute BP sitting:   124 / 80  (left arm)  Vitals Entered By: Willy Eddy, LPN (February 03, 2007 4:06 PM)                 Chief Complaint:  roa.  History of Present Illness: follow up post obesity surgery with risk of vitamin deficiency. Hx of HTN now resolved with weight loss. Hx of OA. Mood disorder also resolved. GERD significantly reduced Check of lipids due  Hypertension History:      She denies headache, chest pain, palpitations, dyspnea with exertion, orthopnea, PND, peripheral edema, visual symptoms, neurologic problems, syncope, and side effects from treatment.        Positive major cardiovascular risk factors include hyperlipidemia and hypertension.  Negative major cardiovascular risk factors include female age less than 74 years old and non-tobacco-user status.       Past Medical History:    Reviewed history from 11/12/2006 and no changes required:       Depression       GERD       Hyperlipidemia       Hypertension       Allergic rhinitis       Low back pain  Past Surgical History:    Reviewed history from 11/12/2006 and no changes required:       TAH       Gastric Bypass-01/2006   Family History:    Reviewed history from 11/12/2006 and no changes required:       Family History Other cancer-Breast       Fam hx MI       Family History of Cardiovascular disorder       Family History Lung cancer       Family History Hypertension  Social History:    Reviewed history from 11/12/2006 and no changes required:       Married       Never Smoked       Alcohol use-no     Physical Exam  General:     alert, well-developed, and well-nourished.   Head:     normocephalic.   Eyes:     pupils equal and pupils round.   Ears:     no external  deformities.   Nose:     no nasal discharge.   Mouth:     pharynx pink and moist.   Neck:     supple.   Lungs:     normal respiratory effort, no dullness, and no wheezes.   Heart:     normal rate and regular rhythm.   Abdomen:     soft, non-tender, and no masses.   Msk:     no joint tenderness, no joint swelling, and no joint warmth.   Pulses:     R and L carotid,radial,femoral,dorsalis pedis and posterior tibial pulses are full and equal bilaterally Extremities:     No clubbing, cyanosis, edema, or deformity noted with normal full range of motion of all joints.   Neurologic:     alert & oriented X3.   Skin:     loose skin on aarms and abd Psych:     Oriented X3.  Impression & Recommendations:  Problem # 1:  HYPERTENSION (ICD-401.9) tx by obesity surgery with effective results BP today: 124/80  10 Yr Risk Heart Disease: Not enough information   Problem # 2:  HYPERLIPIDEMIA (ICD-272.4) normalized Labs Reviewed: SGOT: 15 (10/14/2006)   SGPT: 17 (10/14/2006)  10 Yr Risk Heart Disease: Not enough information   Problem # 3:  ALLERGIC RHINITIS (ICD-477.9) Discussed use of allergy medications and environmental measures.   Problem # 4:  LOW BACK PAIN (ICD-724.2) stetchingDiscussed use of moist heat or ice, modified activities, medications, and stretching/strengthening exercises. Back care instructions given. To be seen in 2 weeks if no improvement; sooner if worsening of symptoms.   Problem # 5:  NECK MASS (ICD-784.2)  Orders: Radiology Referral (Radiology)   Problem # 6:  WEIGHT LOSS, RECENT (ICD-783.21) due to gastric bypass. ACP recomendations to moniter vitamin leves of B12 and Fe as well as D Orders: Venipuncture (04540) TLB-Iron, (Fe) Total (83540-FE) TLB-B12 + Folate Pnl ([id number]/OZH)   Complete Medication List: 1)  Temazepam 30 Mg Caps (Temazepam) .... As needed  Hypertension Assessment/Plan:      The patient's hypertensive risk group  is category B: At least one risk factor (excluding diabetes) with no target organ damage.  Today's blood pressure is 124/80.  Her blood pressure goal is < 140/90.   Patient Instructions: 1)  Please schedule a follow-up appointment in 3 months.    Prescriptions: TEMAZEPAM 30 MG CAPS (TEMAZEPAM) as needed  #30 x 11   Entered and Authorized by:   Stacie Glaze MD   Signed by:   Stacie Glaze MD on 02/03/2007   Method used:   Print then Give to Patient   RxID:   [id number]  ]

## 2010-06-19 NOTE — Progress Notes (Signed)
----   Converted from flag ---- ---- 02/07/2007 9:15 AM, Stacie Glaze MD wrote: iron levels are low and b12 is fine would add a iron suppliment (otc) daily ------------------------------  pt informed

## 2010-06-19 NOTE — Miscellaneous (Signed)
  Medications Added ACIPHEX 20 MG TBEC (RABEPRAZOLE SODIUM)  LUNESTA 3 MG TABS (ESZOPICLONE)  PHENTERMINE HCL 37.5 MG TABS (PHENTERMINE HCL)  TEMAZEPAM 30 MG CAPS (TEMAZEPAM)        Clinical Lists Changes  Medications: Added new medication of ACIPHEX 20 MG TBEC (RABEPRAZOLE SODIUM) Added new medication of LUNESTA 3 MG TABS (ESZOPICLONE) Added new medication of PHENTERMINE HCL 37.5 MG TABS (PHENTERMINE HCL) Added new medication of TEMAZEPAM 30 MG CAPS (TEMAZEPAM) Observations: Added new observation of LLIMPORTMEDS: completed (12/13/2006 8:12)

## 2010-06-19 NOTE — Progress Notes (Signed)
Summary: rx requires prior authoriztion  Phone Note From Pharmacy   Caller: [facility] [address]. [phone]* Call For: jenkins  Reason for Call: Needs renewal Summary of Call: clarinex d requires a prior authorization from the insurance co [phone] Initial call taken by: Roselle Locus,  March 18, 2007 2:20 PM  Follow-up for Phone Call        as pe/. note from 10/30 lmom she will have to pay ..................................................................Marland KitchenWilly Eddy, LPN  March 18, 2007 2:48 PM

## 2010-06-19 NOTE — Assessment & Plan Note (Signed)
Summary: 4 month rov/njr/pt rsc from bmp/cjr   Vital Signs:  Patient profile:   55 year old female Height:      62 inches Weight:      137 pounds BMI:     25.15 Temp:     98.2 degrees F oral Pulse rate:   68 / minute Resp:     14 per minute BP sitting:   124 / 72  (left arm)  Vitals Entered By: Willy Eddy, LPN (May 23, 2010 4:14 PM) CC: roa labs Is Patient Diabetic? No   Primary Care Provider:  Stacie Glaze MD  CC:  roa labs.  History of Present Illness: follow up anemia and  renal fucntion with good results from otc vitamins generally has felt well, s/p bariatric surgery has gained about 5 pounds  Preventive Screening-Counseling & Management  Alcohol-Tobacco     Smoking Status: never     Tobacco Counseling: not indicated; no tobacco use  Problems Prior to Update: 1)  Eustachian Tube Dysfunction, Chronic  (ICD-381.81) 2)  Warts, Hand  (ICD-078.10) 3)  Preventive Health Care  (ICD-V70.0) 4)  Bariatric Surgery Status  (ICD-V45.86) 5)  Special Screening For Malignant Neoplasms Colon  (ICD-V76.51) 6)  Cystitis, Hemorrhagic  (ICD-595.0) 7)  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) 8)  Weight Loss, Recent  (ICD-783.21) 9)  Neck Mass  (ICD-784.2) 10)  Low Back Pain  (ICD-724.2) 11)  Allergic Rhinitis  (ICD-477.9) 12)  Hyperlipidemia  (ICD-272.4) 13)  Gerd  (ICD-530.81) 14)  Depression  (ICD-311)  Current Problems (verified): 1)  Warts, Hand  (ICD-078.10) 2)  Preventive Health Care  (ICD-V70.0) 3)  Bariatric Surgery Status  (ICD-V45.86) 4)  Special Screening For Malignant Neoplasms Colon  (ICD-V76.51) 5)  Cystitis, Hemorrhagic  (ICD-595.0) 6)  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 1 Once Daily 2)  Femara 2.5 Mg Tabs (Letrozole) .... Use As Directed 3)  Omeprazole 20 Mg Cpdr (Omeprazole) .... One By Mouth Daily 4)  Fosamax 70 Mg Tabs (Alendronate Sodium) .Marland Kitchen.. 1 Q Week 5)  Vitamin B-12 100 Mcg Tabs (Cyanocobalamin) .Marland Kitchen.. 1 Once Daily 6)  Calcium 500 Mg Tabs (Calcium Carbonate) .Marland Kitchen.. 1 Once Daily 7)  Zithromax Z-Pak 250 Mg Tabs (Azithromycin) .... As Directed 8)  Allegra-D Allergy & Congestion 60-120 Mg Xr12h-Tab (Fexofenadine-Pseudoephedrine) .... As Directed  Current Medications (verified): 1)  Vitamin D 2000 Unit Caps (Cholecalciferol) .Marland Kitchen.. 1 Once Daily 2)  Femara 2.5 Mg Tabs (Letrozole) .... Use As Directed 3)  Omeprazole 20 Mg Cpdr (Omeprazole) .... One By Mouth Daily 4)  Fosamax 70 Mg Tabs (Alendronate Sodium) .Marland Kitchen.. 1 Q Week 5)  Vitamin B-12 100 Mcg Tabs (Cyanocobalamin) .Marland Kitchen.. 1 Once Daily 6)  Calcium 500 Mg Tabs (Calcium Carbonate) .Marland Kitchen.. 1 Once Daily  Allergies (verified): No Known Drug Allergies  Past History:  Family History: Last updated: 11/12/2006 Family History Other cancer-Breast Fam hx MI Family History of Cardiovascular disorder Family History Lung cancer Family History Hypertension  Social History: Last updated: 11/12/2006 Married Never Smoked Alcohol use-no  Risk Factors: Smoking Status: never (05/23/2010)  Past medical, surgical, family and social histories (including risk factors)  reviewed, and no changes noted (except as noted below).  Past Medical History: Reviewed history from 02/03/2007 and no changes required. Depression GERD Hyperlipidemia Hypertension Allergic rhinitis Low back pain  Past Surgical History: Reviewed history from 11/12/2006 and no changes required. 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 Family History Other cancer-Breast Fam hx MI Family History of Cardiovascular  disorder Family History Lung cancer Family History Hypertension  Social History: Reviewed history from 11/12/2006 and no changes required. Married Never Smoked Alcohol use-no  Review of Systems  The patient denies anorexia, fever, weight loss, weight gain, vision loss, decreased hearing, hoarseness, chest pain, syncope, dyspnea on exertion, peripheral edema, prolonged cough, headaches, hemoptysis, abdominal pain, melena, hematochezia, severe indigestion/heartburn, hematuria, incontinence, genital sores, muscle weakness, suspicious skin lesions, transient blindness, difficulty walking, depression, unusual weight change, abnormal bleeding, enlarged lymph nodes, angioedema, and breast masses.    Physical Exam  General:  Well-developed,well-nourished,in no acute distress; alert,appropriate and cooperative throughout examination Head:  Normocephalic and atraumatic without obvious abnormalities. No apparent alopecia or balding. Eyes:  pupils equal and pupils round.   Ears:  R ear normal and L ear normal.   Nose:  External nasal examination shows no deformity or inflammation. Nasal mucosa are pink and moist without lesions or exudates. Neck:  supple.   Lungs:  Normal respiratory effort, chest expands symmetrically. Lungs are clear to auscultation, no crackles or wheezes. Heart:  normal rate and regular rhythm.   Abdomen:  soft, non-tender, and no masses.   Msk:  No deformity or scoliosis noted of thoracic or lumbar spine.   Extremities:  No clubbing, cyanosis, edema, or deformity noted with normal full range of motion of all joints.   Neurologic:  alert & oriented X3 and cranial nerves II-XII intact.     Impression & Recommendations:  Problem # 1:  EUSTACHIAN TUBE DYSFUNCTION, CHRONIC (ICD-381.81)  Problem # 2:  IRON DEFICIENCY ANEMIA SECONDARY TO BLOOD LOSS (ICD-280.0) the pt needs to continue the nutritional suppliment Her updated medication list for this problem includes:    Vitamin  B-12 100 Mcg Tabs (Cyanocobalamin) .Marland Kitchen... 1 once daily  Hgb: 14.3 (05/13/2010)   Hct: 42.5 (05/13/2010)   Platelets: 205.0 (05/13/2010) RBC: 4.52 (05/13/2010)   RDW: 12.9 (05/13/2010)   WBC: 5.1 (05/13/2010) MCV: 94.0 (05/13/2010)   MCHC: 33.7 (05/13/2010) Iron: 56 (03/26/2009)   % Sat: 21.9 (03/26/2009) B12: 738 (03/26/2009)   Folate: 5.2 (03/26/2009)   TSH: 1.95 (12/06/2009)  Problem # 3:  BARIATRIC SURGERY STATUS (ICD-V45.86) monitering of nutrition and vital use  Complete Medication List: 1)  Vitamin D 2000 Unit Caps (cholecalciferol)  .Marland Kitchen.. 1 once daily 2)  Femara 2.5 Mg Tabs (Letrozole) .... Use as directed 3)  Omeprazole 20 Mg Cpdr (Omeprazole) .... One by mouth daily 4)  Fosamax 70 Mg Tabs (Alendronate sodium) .Marland Kitchen.. 1 q week 5)  Vitamin B-12 100 Mcg Tabs (Cyanocobalamin) .Marland Kitchen.. 1 once daily 6)  Calcium 500 Mg Tabs (Calcium carbonate) .Marland Kitchen.. 1 once daily  Patient Instructions: 1)  August   cpx   Orders Added: 1)  Est. Patient Level IV [04540]

## 2010-06-19 NOTE — Progress Notes (Signed)
Summary: colonoscopy  Phone Note Call from Patient Call back at c [phone]   Caller: vm Call For: jenkins Summary of Call: Need referral to Dr. Laural Benes, [facility] for colonscopy.  Only way I can get in there. Initial call taken by: Rudy Jew, RN,  December 07, 2007 10:01 AM  New Problems: SPECIAL SCREENING FOR MALIGNANT NEOPLASMS COLON (ICD-V76.51)   New Problems: SPECIAL SCREENING FOR MALIGNANT NEOPLASMS COLON (ICD-V76.51)

## 2010-06-19 NOTE — Assessment & Plan Note (Signed)
Summary: ? uti//ccm   Vital Signs:  Patient profile:   55 year old female Temp:     98.8 degrees F oral BP sitting:   140 / 82  (left arm) Cuff size:   regular  Vitals Entered By: Sid Falcon LPN (January 24, 2010 3:53 PM)  History of Present Illness: Patient seen with about 5 day history of suprapubic pressure with some radiation toward the back. Occasional burning with urination. No significant frequency. Saw her gynecologist middle of August and placed on Macrobid for presumed UTI although she states culture came back negative. She did improve on antibiotics. She relates prior history of cystoscopy 2009 for recurrent dysuria and recurrent UTI. Was placed on nitrofurantoin for several months for prophylaxis.  History of hysterectomy. GYN checkup about 3 weeks ago. No recent fever, chills.  Allergies (verified): No Known Drug Allergies  Past History:  Past Medical History: Last updated: 02/03/2007 Depression GERD Hyperlipidemia Hypertension Allergic rhinitis Low back pain PMH reviewed for relevance  Physical Exam  General:  Well-developed,well-nourished,in no acute distress; alert,appropriate and cooperative throughout examination Lungs:  Normal respiratory effort, chest expands symmetrically. Lungs are clear to auscultation, no crackles or wheezes. Heart:  normal rate and regular rhythm.   Msk:  No deformity or scoliosis noted of thoracic or lumbar spine.     Impression & Recommendations:  Problem # 1:  DYSURIA (ICD-788.1)  questionable UTI. Urine culture and start Cipro pending culture results Orders: UA Dipstick w/o Micro (automated)  (81003) T-Culture, Urine (04540-98119)  Her updated medication list for this problem includes:    Ciprofloxacin Hcl 250 Mg Tabs (Ciprofloxacin hcl) ..... One by mouth two times a day for 7 days  Complete Medication List: 1)  Vitamin D 2000 Unit Caps (cholecalciferol)  .Marland Kitchen.. 1 once daily 2)  Femara 2.5 Mg Tabs (Letrozole) ....  Use as directed 3)  Omeprazole 20 Mg Cpdr (Omeprazole) .... One by mouth daily 4)  Fosamax 70 Mg Tabs (Alendronate sodium) .Marland Kitchen.. 1 q week 5)  Vitamin B-12 100 Mcg Tabs (Cyanocobalamin) .Marland Kitchen.. 1 once daily 6)  Calcium 500 Mg Tabs (Calcium carbonate) .Marland Kitchen.. 1 once daily 7)  Ciprofloxacin Hcl 250 Mg Tabs (Ciprofloxacin hcl) .... One by mouth two times a day for 7 days  Patient Instructions: 1)  Drink plenty of fluids up to 3-4 quarts a day. Cranberry juice is especially recommended in addition to large amounts of water. Avoid caffeine & carbonated drinks, they tend to irritate the bladder, Return in 3-5 days if you're not better: sooner if you're feeling worse.  Prescriptions: CIPROFLOXACIN HCL 250 MG TABS (CIPROFLOXACIN HCL) one by mouth two times a day for 7 days  #14 x 0   Entered and Authorized by:   Evelena Peat MD   Signed by:   Evelena Peat MD on 01/24/2010   Method used:   Electronically to        [facility]. [phone]* (retail)       [address]       [place], [place]  [address]       Ph: [phone]       Fax: [phone]   RxID:   [id number]   Laboratory Results   Urine Tests    Routine Urinalysis   Glucose: negative   (Normal Range: Negative) Bilirubin: negative   (Normal Range: Negative) Ketone: negative   (Normal Range: Negative) Spec. Gravity: 1.010   (Normal Range: 1.003-1.035) Blood:  trace-intact   (Normal Range: Negative) pH: 6.5   (Normal Range: 5.0-8.0) Protein: negative   (Normal Range: Negative) Urobilinogen: 0.2   (Normal Range: 0-1) Nitrite: negative   (Normal Range: Negative) Leukocyte Esterace: small   (Normal Range: Negative)    Comments: Sid Falcon LPN  January 24, 2010 4:04 PM

## 2010-06-19 NOTE — Progress Notes (Signed)
Summary: Bilateral ear pain  Phone Note Call from Patient Call back at Home Phone [phone]   Caller: Patient Call For: Stacie Glaze MD Reason for Call: Acute Illness Complaint: Earache/Ear Infection, Headache, Breathing Problems Action Taken: Provider Notified Summary of Call: Pt is having pleuritic pain with bilateral ear pain, with headache x several days.  Is asking to be seen today.  No answer when attempted to get more information.  Pt is coughing, URI symptoms.  Neck Pain.  No fever.  Non productive cough. Took Advil. [facility])   Pain over eyes. Initial call taken by: [facility] CMA AAMA,  April 21, 2010 11:22 AM  Follow-up for Phone Call        zpack and otc allergra D  Follow-up by: Stacie Glaze MD,  April 21, 2010 4:01 PM    New/Updated Medications: ZITHROMAX Z-PAK 250 MG TABS (AZITHROMYCIN) as directed ALLEGRA-D ALLERGY & CONGESTION 60-120 MG XR12H-TAB (FEXOFENADINE-PSEUDOEPHEDRINE) as directed Prescriptions: ALLEGRA-D ALLERGY & CONGESTION 60-120 MG XR12H-TAB (FEXOFENADINE-PSEUDOEPHEDRINE) as directed  #30 x prn   Entered by:   Lynann Beaver CMA AAMA   Authorized by:   Stacie Glaze MD   Signed by:   Lynann Beaver CMA AAMA on 04/21/2010   Method used:   Electronically to        [facility]. [phone]* (retail)       [address]       [place], [place]  [address]       Ph: [phone]       Fax: [phone]   RxID:   [id number] ZITHROMAX Z-PAK 250 MG TABS (AZITHROMYCIN) as directed  #6 x 0   Entered by:   Lynann Beaver CMA AAMA   Authorized by:   Stacie Glaze MD   Signed by:   Lynann Beaver CMA AAMA on 04/21/2010   Method used:   Electronically to        [facility]. [phone]* (retail)       [address]       [place], [place]  [address]       Ph: [phone]       Fax: [phone]   RxID:   [id number]  Notified pt.

## 2010-06-19 NOTE — Letter (Signed)
Summary: [facility] note  [facility]   Imported By: Kassie Mends 12/30/2007 10:22:43  _____________________________________________________________________  External Attachment:    Type:   Image     Comment:   [facility] note

## 2010-06-19 NOTE — Progress Notes (Signed)
Summary: blader infection  Phone Note Call from Patient Call back at [phone]   Caller: patient live Call For: Stacie Glaze MD Summary of Call: Patient has a bladder infection,burning,lower back pain,bloatint.  You do not have any opening today can you call something in for this.  [facility] Please call Initial call taken by: Celine Ahr,  Oct 14, 2007 9:04 AM  Follow-up for Phone Call        Per Dr. Lovell Sheehan, pt will come ASAP for UA. Follow-up by: Lynann Beaver CMA,  Oct 14, 2007 10:20 AM

## 2010-06-19 NOTE — Assessment & Plan Note (Signed)
Summary: 1 month rov/njr   Vital Signs:  Patient profile:   55 year old female Height:      62 inches Weight:      129 pounds BMI:     23.68 Temp:     98.2 degrees F oral Pulse rate:   76 / minute Resp:     14 per minute BP sitting:   110 / 70  (left arm)  Vitals Entered By: Willy Eddy, LPN (Sep 19, 1608 3:35 PM)  CC:  roa.  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 One By Mouth Daily 2)  Vitamin D 1000 Unit Caps (Cholecalciferol) .Marland Kitchen.. 1 Once Daily 3)  Femara 2.5 Mg Tabs (Letrozole) .... Use As Directed  Current Medications (verified): 1)  Ferrous Fumarate 324 Mg  Tabs (Ferrous Fumarate) .... One By Mouth Daily 2)  Vitamin D 1000 Unit Caps (Cholecalciferol) .Marland Kitchen.. 1 Once Daily 3)  Femara 2.5 Mg Tabs (Letrozole) .... Use As Directed 4)  Nexium 40 Mg Cpdr (Esomeprazole Magnesium) .... One By Mouth Daily  Allergies (verified): No Known Drug Allergies  Past History:  Family History:    Family History Other cancer-Breast    Fam hx MI    Family History of Cardiovascular disorder    Family History Lung cancer    Family History Hypertension     (11/12/2006)  Social History:    Married    Never Smoked    Alcohol use-no     (11/12/2006)  Risk Factors:    Alcohol  Use: N/A    >5 drinks/d w/in last 3 months: N/A    Caffeine Use: N/A    Diet: N/A    Exercise: N/A  Risk Factors:    Smoking Status: never (11/12/2006)    Packs/Day: N/A    Cigars/wk: N/A    Pipe Use/wk: N/A    Cans of tobacco/wk: N/A    Passive Smoke Exposure: N/A  Past medical, surgical, family and social histories (including risk factors) reviewed, and no changes noted (except as noted below).  Past Medical History:    Reviewed history from 02/03/2007 and no changes required:    Depression    GERD    Hyperlipidemia    Hypertension    Allergic rhinitis    Low back pain  Past Surgical History:    Reviewed history from 11/12/2006 and no changes required:    TAH    Gastric Bypass-01/2006  Family History:    Reviewed history from 11/12/2006 and no changes required:       Family History Other cancer-Breast       Fam hx MI       Family History of Cardiovascular disorder       Family History Lung cancer  Family History Hypertension  Social History:    Reviewed history from 11/12/2006 and no changes required:       Married       Never Smoked       Alcohol use-no  Review of Systems  The patient denies anorexia, fever, weight loss, weight gain, vision loss, decreased hearing, hoarseness, chest pain, syncope, dyspnea on exertion, peripheral edema, prolonged cough, headaches, hemoptysis, abdominal pain, melena, hematochezia, severe indigestion/heartburn, hematuria, incontinence, genital sores, muscle weakness, suspicious skin lesions, transient blindness, difficulty walking, depression, unusual weight change, abnormal bleeding, enlarged lymph nodes, angioedema, and breast masses.    Physical Exam  General:  Well-developed,well-nourished,in no acute distress; alert,appropriate and cooperative throughout examination Head:  Normocephalic and atraumatic without obvious abnormalities. No apparent alopecia or balding. Ears:  External ear exam shows no significant lesions or  deformities.  Otoscopic examination reveals clear canals, tympanic membranes are intact bilaterally without bulging, retraction, inflammation or discharge. Hearing is grossly normal bilaterally. Nose:  External nasal examination shows no deformity or inflammation. Nasal mucosa are pink and moist without lesions or exudates. Mouth:  Oral mucosa and oropharynx without lesions or exudates.  Teeth in good repair. Neck:  supple.   Lungs:  normal respiratory effort, no dullness, and no wheezes.   Heart:  normal rate and regular rhythm.     Impression & Recommendations:  Problem # 1:  BARIATRIC SURGERY STATUS (ICD-V45.86) low iron  Problem # 2:  IRON DEFICIENCY ANEMIA SECONDARY TO BLOOD LOSS (ICD-280.0) Assessment: Deteriorated  will repeat  the iron and it the levels are still low will  give IV medications Her updated medication list for this problem includes:    Ferrous Fumarate 324 Mg Tabs (Ferrous fumarate) ..... One by mouth daily  Hgb: 13.5 (08/22/2008)   Hct: 39.9 (08/22/2008)   Platelets: 223.0 (08/22/2008) RBC: 4.29 (08/22/2008)   RDW: 11.4 (08/22/2008)   WBC: 5.1 (08/22/2008) MCV: 92.9 (08/22/2008)   MCHC: 33.8 (08/22/2008) Iron: 35 (08/22/2008)   % Sat: 15.5 (08/22/2008) B12: >1500 pg/mL (08/22/2008)   Folate: 6.7 (08/22/2008)     Orders: Venipuncture (16109) TLB-IBC Pnl (Iron/FE;Transferrin) (83550-IBC)  Problem # 3:  GERD (ICD-530.81)  risk of bleeding ulcer at the anastamosis Labs Reviewed: Hgb: 13.5 (08/22/2008)   Hct: 39.9 (08/22/2008)  Her updated medication list for this problem includes:    Nexium 40 Mg Cpdr (Esomeprazole magnesium) ..... One by mouth daily  Complete Medication List: 1)  Ferrous Fumarate 324 Mg Tabs (Ferrous fumarate) .... One by mouth daily 2)  Vitamin D 1000 Unit Caps (Cholecalciferol) .Marland Kitchen.. 1 once daily 3)  Femara 2.5 Mg Tabs (Letrozole) .... Use as directed 4)  Nexium 40 Mg Cpdr (Esomeprazole magnesium) .... One by mouth daily  Patient  Instructions: 1)  Please schedule a follow-up appointment in 3 months. Prescriptions: NEXIUM 40 MG CPDR (ESOMEPRAZOLE MAGNESIUM) one by mouth daily  #30 x 11   Entered and Authorized by:   Stacie Glaze MD   Signed by:   Stacie Glaze MD on 09/19/2008   Method used:   Electronically to        [facility]. [phone]* (retail)       [address]       [place], [place]  [address]       Ph: [phone]       Fax: [phone]   RxID:   [id number]

## 2010-06-19 NOTE — Letter (Signed)
Summary: Dr. Colin Benton note  Dr. Colin Benton note   Imported By: Kassie Mends 02/22/2007 09:10:24  _____________________________________________________________________  External Attachment:    Type:   Image     Comment:   Dr. Colin Benton note

## 2010-06-19 NOTE — Assessment & Plan Note (Signed)
Summary: ROA//   Vital Signs:  Patient Profile:   55 Years Old Female Height:     62 inches (157.48 cm) Weight:      129 pounds (58.64 kg) Temp:     98.1 degrees F (36.72 degrees C) oral Pulse rate:   63 / minute BP sitting:   125 / 64  (right arm)  Pt. in pain?   no  Vitals Entered By: Arcola Jansky, RN (May 09, 2007 3:21 PM)                  Chief Complaint:  3 MONTH F/U.  History of Present Illness: follow up weight loss surgery  Lipid Management History:      Positive NCEP/ATP III risk factors include hypertension.  Negative NCEP/ATP III risk factors include female age less than 55 years old and non-tobacco-user status.     Current Allergies (reviewed today): No known allergies  Updated/Current Medications (including changes made in today's visit):  TEMAZEPAM 30 MG CAPS (TEMAZEPAM) as needed CLARINEX-D 24 HOUR 5-240 MG  TB24 (DESLORATADINE-PSEUDOEPHEDRINE) one by mouth daily;PRN FERROUS FUMARATE 324 MG  TABS (FERROUS FUMARATE) one by mouth daily RESTORIL 15 MG  CAPS (TEMAZEPAM) one by mouth q hs   Past Medical History:    Reviewed history from 02/03/2007 and no changes required:       Depression       GERD       Hyperlipidemia       Hypertension       Allergic rhinitis       Low back pain  Past Surgical History:    Reviewed history from 11/12/2006 and no changes required:       TAH       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 No apparent alopecia or balding. Nose:     External nasal examination shows no deformity or inflammation. Nasal mucosa are pink and moist without lesions or exudates. Mouth:     Oral mucosa and oropharynx without lesions or exudates.  Teeth in good repair.    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 One by mouth daily  Orders: TLB-Iron, (Fe) Total (83540-FE) TLB-Transferrin (84466-TRNSF) TLB-CBC Platelet - w/Differential (85025-CBCD) Hgb: 13.8 (10/14/2006)   Hct: 39.5 (10/14/2006)   RDW: 12.2 (10/14/2006)   MCV: 88.9 (10/14/2006)   MCHC: 35.0 (10/14/2006) Iron: 32 (02/03/2007)   % Sat: 17.6 (06/25/2006) B12: 927 (02/03/2007)   Folate: 9.2 (02/03/2007)     Problem # 3:  ALLERGIC RHINITIS (ICD-477.9) Discussed  use of allergy medications and environmental measures.   Complete Medication List: 1)  Temazepam 30 Mg Caps (Temazepam) .... As needed 2)  Clarinex-d 24 Hour 5-240 Mg Tb24 (Desloratadine-pseudoephedrine) .... One by mouth daily;prn 3)  Ferrous Fumarate 324 Mg Tabs (Ferrous fumarate) .... One by mouth daily 4)  Restoril 15 Mg Caps (Temazepam) .... One by mouth q hs  Lipid Assessment/Plan:      Based on NCEP/ATP III, the patient's risk factor category is "0-1 risk factors".  From this information, the patient's calculated lipid goals are as follows: Total cholesterol goal is 200; LDL cholesterol goal is 160; HDL cholesterol goal is 40; Triglyceride goal is 150.     Patient Instructions: 1)  Please schedule a follow-up appointment in 3 months.    Prescriptions: RESTORIL 15 MG  CAPS (TEMAZEPAM) one by mouth q hs  #30 x 3   Entered and Authorized by:   Stacie Glaze  MD   Signed by:   Stacie Glaze MD on 05/09/2007   Method used:   Print then Give to Patient   RxID:   [id number]  ]  Appended Document: Orders Update    Clinical Lists Changes  Orders: Added new Service order of Venipuncture [phone]) - Signed

## 2010-06-19 NOTE — Letter (Signed)
Summary: [facility]  [facility]   Imported By: Maryln Gottron 10/10/2008 09:40:20  _____________________________________________________________________  External Attachment:    Type:   Image     Comment:   External Document

## 2010-06-19 NOTE — Assessment & Plan Note (Signed)
Summary: UTI/ UA only/dm   Vital Signs:  Patient Profile:   55 Years Old Female Height:     62 inches (157.48 cm) Temp:     98.4 degrees F oral Pulse rate:   76 / minute Resp:     14 per minute BP sitting:   130 / 80  (left arm)  Vitals Entered By: Willy Eddy, LPN (November 08, 2007 11:47 AM)                 Chief Complaint:  c/o dysuria and frequency --.    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 No apparent alopecia or balding. Nose:     External nasal examination shows no deformity or inflammation. Nasal mucosa are pink and moist without lesions or exudates. Mouth:     Oral mucosa and oropharynx without lesions or exudates.  Teeth in good repair. Neck:     supple.   Lungs:     normal respiratory effort, no dullness, and no wheezes.   Heart:     normal rate and regular rhythm.   Abdomen:     soft, non-tender, and no masses.      Impression & Recommendations:  Problem # 1:  CYSTITIS, HEMORRHAGIC (ICD-595.0)  The following medications  were removed from the medication list:    Levaquin 750 Mg Tabs (Levofloxacin) ..... One by mouth for 3 days    Amoxil 500 Mg Caps (Amoxicillin) .Marland Kitchen... Three times a day    Macrobid 100 Mg Caps (Nitrofurantoin monohyd macro) ..... One by mouth bid  Her updated medication list for this problem includes:    Cipro 500 Mg Tabs (Ciprofloxacin hcl) .Marland Kitchen... 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 One by mouth daily   Problem # 3:  WEIGHT LOSS, RECENT (ICD-783.21) possible cystitis charnic and should be evaluated for bladder empting studies and cystoscopy Orders: Urology Referral (Urology)   Complete  Medication List: 1)  Temazepam 30 Mg Caps (Temazepam) .... As needed 2)  Clarinex-d 24 Hour 5-240 Mg Tb24 (Desloratadine-pseudoephedrine) .... One by mouth daily;prn 3)  Ferrous Fumarate 324 Mg Tabs (Ferrous fumarate) .... One by mouth daily 4)  Cipro 500 Mg Tabs (Ciprofloxacin hcl) .... Take one (1) by mouth twice a day    Prescriptions: CIPRO 500 MG TABS (CIPROFLOXACIN HCL) Take one (1) by mouth twice a day  #20 x 0   Entered and Authorized by:   Stacie Glaze MD   Signed by:   Stacie Glaze MD on 11/08/2007   Method used:   Electronically sent to ...       [facility] [address] [phone]*       [address]       [place], [place]  [address]       Ph: [phone]       Fax: [phone]   RxID:   [id number]  ] Laboratory Results   Urine Tests    Routine Urinalysis   Color: yellow Appearance: Hazy Glucose: negative   (Normal Range: Negative) Bilirubin: negative   (Normal Range: Negative) Ketone: negative   (Normal Range: Negative) Spec. Gravity: 1.015    (Normal Range: 1.003-1.035) Blood: 1+   (Normal Range: Negative) pH: 5.5   (Normal Range: 5.0-8.0) Protein: 1+   (Normal Range: Negative) Urobilinogen: 0.2   (Normal Range: 0-1) Nitrite: negative   (Normal Range: Negative) Leukocyte Esterace: 2+   (Normal Range: Negative)    Comments: Anna Navarro  November 08, 2007 11:42 AM      Appended Document: Orders Update     Clinical Lists Changes  Observations: Added new observation of PH URINE: 5.0  (11/08/2007 12:13) Added new observation of SPEC GR URIN: 1.020  (11/08/2007 12:13) Added new observation of APPEARANCE U: Clear  (11/08/2007 12:13) Added new observation of UA COLOR: yellow  (11/08/2007 12:13) Added new observation of WBC DIPSTK U: 3+  (11/08/2007 12:13) Added new observation of NITRITE URN: negative  (11/08/2007 12:13) Added new observation of UROBILINOGEN: 0.2  (11/08/2007 12:13) Added new observation of PROTEIN, URN: 1+  (11/08/2007 12:13) Added new observation of BLOOD UR DIP: 1+  (11/08/2007 12:13) Added new observation of KETONES URN: trace (5)  (11/08/2007 12:13) Added new observation of BILIRUBIN UR: negative  (11/08/2007 12:13) Added new observation of GLUCOSE, URN: negative  (11/08/2007 12:13)       Laboratory Results   Urine Tests   Date/Time Reported: November 08, 2007 12:13 PM   Routine Urinalysis   Color: yellow Appearance: Clear Glucose: negative   (Normal Range: Negative) Bilirubin: negative   (Normal Range: Negative) Ketone: trace (5)   (Normal Range: Negative) Spec. Gravity: 1.020   (Normal Range: 1.003-1.035) Blood: 1+   (Normal Range: Negative) pH: 5.0   (Normal Range: 5.0-8.0) Protein: 1+   (Normal Range: Negative) Urobilinogen: 0.2   (Normal Range: 0-1) Nitrite: negative   (Normal Range: Negative) Leukocyte Esterace: 3+   (Normal Range: Negative)

## 2010-06-19 NOTE — Letter (Signed)
[facility]   Imported By: Kassie Mends 11/01/2007 08:33:18  _____________________________________________________________________  External Attachment:    Type:   Image     Comment:   [facility]

## 2010-06-19 NOTE — Assessment & Plan Note (Signed)
Summary: 1 month follow up/mhf   Vital Signs:  Patient profile:   55 year old female Height:      62 inches Weight:      130 pounds BMI:     23.86 Temp:     98.2 degrees F oral Pulse rate:   76 / minute Resp:     14 per minute BP sitting:   120 / 70  (left arm)  Vitals Entered By: Willy Eddy, LPN (August 22, 7844 3:37 PM)  CC:  c/epigastric type pain and though was constipation  and but had bm and still had pain-sever eipgastric pain at times.  History of Present Illness: had bariatric surgery with mild epigastic pain worrison for ulcer  Problems Prior to Update: 1)  Bariatric Surgery Status  (ICD-V45.86) 2)  Special Screening For Malignant Neoplasms Colon  (ICD-V76.51) 3)  Cystitis, Hemorrhagic  (ICD-595.0) 4)  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) 5)  Weight Loss, Recent  (ICD-783.21) 6)  Neck Mass  (ICD-784.2) 7)  Low Back Pain  (ICD-724.2) 8)  Allergic Rhinitis  (ICD-477.9) 9)  Hyperlipidemia  (ICD-272.4) 10)  Gerd  (ICD-530.81) 11)  Depression  (ICD-311)  Medications Prior to Update: 1)  Restoril 15 Mg  Caps (Temazepam) .Marland Kitchen.. 1 At Bedtime As Needed 2)  Clarinex-D 24 Hour 5-240 Mg  Tb24 (Desloratadine-Pseudoephedrine) .... One By Mouth Daily;prn 3)  Ferrous Fumarate 324 Mg  Tabs (Ferrous Fumarate) .... One By Mouth Daily 4)  Cipro 500 Mg Tabs (Ciprofloxacin Hcl) .... Take One (1) By Mouth Twice A Day  Current Medications (verified): 1)  Ferrous Fumarate 324 Mg  Tabs (Ferrous Fumarate) .... One By Mouth Daily 2)  Vitamin D 1000 Unit Caps (Cholecalciferol) .Marland Kitchen.. 1 Once Daily 3)  Femara 2.5 Mg Tabs (Letrozole) .... Use As Directed  Allergies (verified): No Known Drug Allergies  Past History:  Family History:    Family History Other cancer-Breast    Fam hx MI    Family History of Cardiovascular disorder    Family History Lung cancer    Family History Hypertension     (11/12/2006)  Social History:    Married    Never Smoked  Alcohol use-no     (11/12/2006)  Risk Factors:    Alcohol Use: N/A    >5 drinks/d w/in last 3 months: N/A    Caffeine Use: N/A    Diet: N/A    Exercise: N/A  Risk Factors:    Smoking Status: never (11/12/2006)    Packs/Day: N/A    Cigars/wk: N/A    Pipe Use/wk: N/A    Cans of tobacco/wk: N/A    Passive Smoke Exposure: N/A  Past medical, surgical, family and social histories (including risk factors) reviewed, and no changes noted (except as noted below).  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  The patient denies anorexia, fever, weight gain, vision loss, decreased hearing, hoarseness, chest pain, syncope, dyspnea on exertion, peripheral edema, prolonged cough, headaches, hemoptysis, abdominal pain, melena, hematochezia, hematuria, incontinence, genital sores, muscle weakness, suspicious skin lesions, transient blindness, difficulty walking, depression, unusual weight change, abnormal bleeding, enlarged lymph nodes, angioedema, and breast masses.    Physical Exam  General:  thin pale white female Head:  Normocephalic and atraumatic without obvious abnormalities. No apparent alopecia or balding. Ears:  External ear exam shows no significant  lesions or deformities.  Otoscopic examination reveals clear canals, tympanic membranes are intact bilaterally without bulging, retraction, inflammation or discharge. Hearing is grossly normal bilaterally. Nose:  External nasal examination shows no deformity or inflammation. Nasal mucosa are pink and moist without lesions or exudates. Neck:  supple.   Lungs:  normal respiratory effort, no dullness, and no wheezes.   Heart:  normal rate and regular rhythm.   Abdomen:  soft, non-tender, and no masses.   Msk:  no joint tenderness, no joint swelling, and no joint warmth.   Pulses:  R and L carotid,radial,femoral,dorsalis pedis and posterior tibial pulses are full and equal bilaterally Extremities:  No clubbing, cyanosis, edema, or deformity noted with normal full range of motion of all joints.   Neurologic:  alert & oriented X3.     Impression & Recommendations:  Problem # 1:  BARIATRIC SURGERY STATUS (ICD-V45.86) Assessment Unchanged monitering vitamin levels due to altered stomack Orders: T-Vitamin D (25-Hydroxy) (10272-53664) TLB-B12 + Folate Pnl (40347_42595-G38/VFI) TLB-CBC Platelet - w/Differential (85025-CBCD) TLB-Calcium (82310-CA) TLB-Magnesium (Mg) (83735-MG)  Problem # 2:  GERD (ICD-530.81) Assessment: Deteriorated  possible ulcer at anastamosis Labs Reviewed: Hgb: 13.3 (05/09/2007)   Hct: 39.4 (05/09/2007)  Her updated medication list for this problem includes:    Omeprazole 20 Mg Cpdr (Omeprazole) ..... One by mouth daily  Complete Medication List: 1)  Vitamin D 1000 Unit Caps (Cholecalciferol) .Marland Kitchen.. 1 once daily 2)  Femara 2.5 Mg Tabs (Letrozole) .... Use as directed 3)  Omeprazole 20 Mg Cpdr (Omeprazole) .... One by mouth daily  Other Orders: Venipuncture (43329) TLB-IBC Pnl (Iron/FE;Transferrin) (83550-IBC)  Patient Instructions: 1)  Please schedule a follow-up appointment in 1 month.

## 2010-06-19 NOTE — Progress Notes (Signed)
Summary: continue iron supplement?  Phone Note Call from Patient Call back at [phone]   Caller: live Call For: Stacie Glaze MD Reason for Call: Acute Illness Summary of Call: Had the iron transfusion Mon.  Should she continue the iron supplement? Initial call taken by: Rudy Jew, RN,  Oct 03, 2008 3:54 PM  Follow-up for Phone Call        no it didnt help Follow-up by: Willy Eddy, LPN,  Oct 03, 2008 4:08 PM  Additional Follow-up for Phone Call Additional follow up Details #1::        Patient says OK. Additional Follow-up by: Rudy Jew, RN,  Oct 03, 2008 4:10 PM

## 2010-06-19 NOTE — Letter (Signed)
[facility]   Imported By: Kassie Mends 11/01/2007 08:32:41  _____________________________________________________________________  External Attachment:    Type:   Image     Comment:   [facility]

## 2010-06-19 NOTE — Progress Notes (Signed)
Summary: WANTS RX FOR SAMPLE MED  Phone Note Call from Patient   Caller: Patient Call For: JENKINS Summary of Call: DR Lovell Sheehan GAVE SAMPLES OF CLARINEX D EXTENDED RELEASE HAVING LOTS OF DRAINAGE AGAIN  [facility] [address]  WANTS RX FOR THE SAMPLE MED Initial call taken by: Roselle Locus,  March 17, 2007 1:45 PM  Follow-up for Phone Call        clarinx d may not be covered...  it not use the claritin D we will go a head a call in RX  number 30 sent electronically Follow-up by: Stacie Glaze MD,  March 17, 2007 5:11 PM  Additional Follow-up for Phone Call Additional follow up Details #1::        LM on pt home phone of RX call in and alternate OTC med. ..................................................................Marland KitchenSid Falcon LPN  March 17, 2007 5:44 PM  Additional Follow-up by: Stacie Glaze MD,  March 17, 2007 5:13 PM    New/Updated Medications: CLARINEX-D 24 HOUR 5-240 MG  TB24 (DESLORATADINE-PSEUDOEPHEDRINE) one by mouth daily   Prescriptions: CLARINEX-D 24 HOUR 5-240 MG  TB24 (DESLORATADINE-PSEUDOEPHEDRINE) one by mouth daily  #30 x 6   Entered and Authorized by:   Stacie Glaze MD   Signed by:   Stacie Glaze MD on 03/17/2007   Method used:   Electronically sent to ...       [facility] [address] [phone]*       [address]       [place], [place]  [address]       Ph: [phone]       Fax: [phone]   RxID:   [id number]

## 2010-06-19 NOTE — Assessment & Plan Note (Signed)
Summary: uri   Vital Signs:  Patient Profile:   55 Years Old Female Height:     62 inches (157.48 cm) Temp:     98.1 degrees F  Vitals Entered By: Willy Eddy, LPN (Oct 14, 2007 11:51 AM)                 Chief Complaint:  uti symptoms and Dysuria.  History of Present Illness:  Dysuria      This is a 55 year old woman who presents with Dysuria.  The patient reports burning with urination and vaginal discharge.  Associated symptoms include flank pain.  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  History is significant for recent UTI.      Current Allergies: No known allergies   Past Medical History:    Reviewed history from 02/03/2007 and no changes required:       Depression       GERD       Hyperlipidemia       Hypertension       Allergic rhinitis       Low back pain     Review of Systems       The patient complains of hematuria.     Physical Exam  General:     Well-developed,well-nourished,in no acute distress; alert,appropriate and cooperative throughout examination Ears:     External ear exam shows no significant lesions or deformities.  Otoscopic examination reveals clear canals, tympanic membranes are intact bilaterally without bulging, retraction, inflammation or discharge. Hearing is grossly normal bilaterally. Mouth:     Oral mucosa and oropharynx without lesions or exudates.  Teeth in good repair. Abdomen:     soft, non-tender, and no masses.      Impression & Recommendations:  Problem # 1:  CYSTITIS, HEMORRHAGIC (ICD-595.0)  Her updated medication list for this problem includes:    Levaquin 750 Mg Tabs (Levofloxacin) ..... One by mouth for 3 days    Amoxil 500 Mg Caps (Amoxicillin) .Marland Kitchen... Three times a day    Macrobid 100 Mg Caps (Nitrofurantoin monohyd macro) ..... One by mouth bid  Encouraged to push clear liquids, get enough rest, and take acetaminophen as needed. To be seen in 10 days if no  improvement, sooner if worse.   Complete Medication List: 1)  Temazepam 30 Mg Caps (Temazepam) .... As needed 2)  Clarinex-d 24 Hour 5-240 Mg Tb24 (Desloratadine-pseudoephedrine) .... One by mouth daily;prn 3)  Ferrous Fumarate 324 Mg Tabs (Ferrous fumarate) .... One by mouth daily 4)  Restoril 15 Mg Caps (Temazepam) .... One by mouth q hs 5)  Levaquin 750 Mg Tabs (Levofloxacin) .... One by mouth for 3 days 6)  Amoxil 500 Mg Caps (Amoxicillin) .... Three times a day 7)  Macrobid 100 Mg Caps (Nitrofurantoin monohyd macro) .... One by mouth bid    Prescriptions: MACROBID 100 MG  CAPS (NITROFURANTOIN MONOHYD MACRO) one by mouth BID  #20 x 0   Entered and Authorized by:   Stacie Glaze MD   Signed by:   Stacie Glaze MD on 10/14/2007   Method used:   Electronically sent to ...       [facility] [address] [phone]*       [address]       [place], [place]  [address]       Ph: [phone]       Fax: [phone]  RxID:   [id number]  ]  Appended Document: Orders Update     Clinical Lists Changes  Orders: Added new Service order of Est. Patient Level III (96295) - Signed

## 2010-06-19 NOTE — Progress Notes (Signed)
Summary: Lab work needs to be faxed to Dr Glyn Ade office  Phone Note Call from Patient Call back at [phone]   Caller: Patient Call For: Dr Lovell Sheehan Summary of Call: pt had labwork done 2 weeks ago on 9/22  need to have a copy of labwork sent to Dr. Simona Huh, [facility]  pls call back to confirm  Initial call taken by: Shan Levans,  February 17, 2007 10:23 AM  Follow-up for Phone Call        Lab work sent to Dr Tami Lin office as requested at [phone], confirmation received.  Left message on pt home phone. Follow-up by: Sid Falcon LPN,  February 17, 2007 3:09 PM

## 2010-06-20 NOTE — Letter (Signed)
Summary: [facility]  [facility]   Imported By: Maryln Gottron 04/02/2010 13:38:25  _____________________________________________________________________  External Attachment:    Type:   Image     Comment:   External Document

## 2010-06-26 ENCOUNTER — Other Ambulatory Visit: Payer: Self-pay | Admitting: Oncology

## 2010-06-26 ENCOUNTER — Encounter (HOSPITAL_BASED_OUTPATIENT_CLINIC_OR_DEPARTMENT_OTHER): Payer: BC Managed Care – PPO | Admitting: Oncology

## 2010-06-26 DIAGNOSIS — C50419 Malignant neoplasm of upper-outer quadrant of unspecified female breast: Secondary | ICD-10-CM

## 2010-06-26 LAB — CBC WITH DIFFERENTIAL/PLATELET
BASO%: 0.4 % (ref 0.0–2.0)
Basophils Absolute: 0 10*3/uL (ref 0.0–0.1)
EOS%: 1.3 % (ref 0.0–7.0)
Eosinophils Absolute: 0.1 10*3/uL (ref 0.0–0.5)
HCT: 40.7 % (ref 34.8–46.6)
HGB: 13.4 g/dL (ref 11.6–15.9)
LYMPH%: 27.7 % (ref 14.0–49.7)
MCH: 31.1 pg (ref 25.1–34.0)
MCHC: 33 g/dL (ref 31.5–36.0)
MCV: 94.1 fL (ref 79.5–101.0)
MONO#: 0.4 10*3/uL (ref 0.1–0.9)
MONO%: 6.6 % (ref 0.0–14.0)
NEUT#: 3.6 10*3/uL (ref 1.5–6.5)
NEUT%: 64 % (ref 38.4–76.8)
Platelets: 233 10*3/uL (ref 145–400)
RBC: 4.33 10*6/uL (ref 3.70–5.45)
RDW: 12.9 % (ref 11.2–14.5)
WBC: 5.6 10*3/uL (ref 3.9–10.3)
lymph#: 1.5 10*3/uL (ref 0.9–3.3)

## 2010-06-26 LAB — COMPREHENSIVE METABOLIC PANEL
ALT: 31 U/L (ref 0–35)
AST: 23 U/L (ref 0–37)
Albumin: 4.2 g/dL (ref 3.5–5.2)
Alkaline Phosphatase: 37 U/L — ABNORMAL LOW (ref 39–117)
BUN: 11 mg/dL (ref 6–23)
CO2: 31 mEq/L (ref 19–32)
Calcium: 9.4 mg/dL (ref 8.4–10.5)
Chloride: 102 mEq/L (ref 96–112)
Creatinine, Ser: 0.79 mg/dL (ref 0.40–1.20)
Glucose, Bld: 96 mg/dL (ref 70–99)
Potassium: 4.1 mEq/L (ref 3.5–5.3)
Sodium: 141 mEq/L (ref 135–145)
Total Bilirubin: 0.5 mg/dL (ref 0.3–1.2)
Total Protein: 6.9 g/dL (ref 6.0–8.3)

## 2010-06-26 LAB — LACTATE DEHYDROGENASE: LDH: 138 U/L (ref 94–250)

## 2010-06-27 LAB — CANCER ANTIGEN 27.29: CA 27.29: 8 U/mL (ref 0–39)

## 2010-06-27 LAB — VITAMIN D 25 HYDROXY (VIT D DEFICIENCY, FRACTURES): Vit D, 25-Hydroxy: 42 ng/mL (ref 30–89)

## 2010-07-02 ENCOUNTER — Encounter: Payer: BC Managed Care – PPO | Admitting: Oncology

## 2010-07-02 ENCOUNTER — Other Ambulatory Visit: Payer: Self-pay | Admitting: Oncology

## 2010-07-02 DIAGNOSIS — Z780 Asymptomatic menopausal state: Secondary | ICD-10-CM

## 2010-07-31 ENCOUNTER — Ambulatory Visit
Admission: RE | Admit: 2010-07-31 | Discharge: 2010-07-31 | Disposition: A | Discharge status: Home / Self Care | Payer: BC Managed Care – PPO | Source: Ambulatory Visit | Attending: Oncology | Admitting: Oncology

## 2010-07-31 DIAGNOSIS — Z9889 Other specified postprocedural states: Secondary | ICD-10-CM

## 2010-09-30 NOTE — Assessment & Plan Note (Signed)
[facility]                                 ON-CALL NOTE   NAME:Anna Navarro, Anna Navarro                    MRN:          [id number]  DATE:10/23/2006                            DOB:          02-11-1956    Time was 2:30 p.m.  Phone number is [phone].  Dr. Lovell Sheehan is her  regular doctor.  Patient has been having stomach cramps on and off.  She  said for the past 2 hours, they have been way worse than they have in  the past.  She had a gallbladder ultrasound done this week, and it was  fine.  She had gastric bypass 6 months ago, and has been doing fine.  She takes TUMS, vitamins, and iron, but has had no problem with that.  She denies fever, nausea, vomiting, or any other symptoms.  She is a  little constipated, but not bad.  She said the pain is doubling her  over.  I told her that she could try 3 or 4 TUMS with a sip of water,  but if that does not help her pain immediately, to go on to the  emergency room or Urgent Care and get checked out, and this is what she  is going to do.     Marne A. Tower, MD  Electronically Signed    MAT/MedQ  DD: 10/23/2006  DT: 10/23/2006  Job #: [id number]   cc:   Stacie Glaze, MD

## 2010-09-30 NOTE — Op Note (Signed)
NAMEMAHAGONY, GRIEB             ACCOUNT NO.:  [id number]   MEDICAL RECORD NO.:  [id number]          PATIENT TYPE:  AMB   LOCATION:  SDS                          FACILITY:  [facility]   PHYSICIAN:  Alfonse Ras, MD   DATE OF BIRTH:  [birth year]   DATE OF PROCEDURE:  08/17/2007  DATE OF DISCHARGE:  08/17/2007                               OPERATIVE REPORT   PREOPERATIVE DIAGNOSIS:  Abnormal right mammogram.   POSTOPERATIVE DIAGNOSIS:  Abnormal right mammogram.   PROCEDURE:  Needle localization, right partial mastectomy.   SURGEON:  Alfonse Ras, MD.   ANESTHESIA:  General laryngeal mask.   DESCRIPTION:  The patient was taken to the operating room and placed in  a supine position.  After adequate anesthesia was induced using  laryngeal mask, the right breast was prepped and draped in the normal  sterile fashion.  Using curvilinear incision just medial to the entrance  of the wire, I dissected down through subcutaneous tissue and took all  tissue surrounding the wire to the depth of the pectoralis fascia.  I  felt that where there was a previous hematoma was quite close to the  medial margin, so additional medial margin was taken.  Adequate  hemostasis was ensured.  The clip and calcifications were verified to be  in the specimen by specimen mammography.  The skin was closed with  subcuticular 4-0 Monocryl.  Steri-Strips and sterile dressings were  applied.  The patient tolerated the procedure well and went to PACU in  good condition.      Alfonse Ras, MD  Electronically Signed     KRE/MEDQ  D:  08/17/2007  T:  08/17/2007  Job:  [id number]

## 2010-09-30 NOTE — Op Note (Signed)
NAMESHYANNA, KLINGEL             ACCOUNT NO.:  [id number]   MEDICAL RECORD NO.:  [id number]          PATIENT TYPE:  AMB   LOCATION:  DSC                          FACILITY:  [facility]   PHYSICIAN:  Alfonse Ras, MD   DATE OF BIRTH:  January 13, 1956   DATE OF PROCEDURE:  08/24/2007  DATE OF DISCHARGE:                               OPERATIVE REPORT   PREOPERATIVE DIAGNOSIS:  Invasive right breast cancer.   POSTOPERATIVE DIAGNOSIS:  Invasive right breast cancer.   PROCEDURE:  Right axillary sentinel lymph node biopsy with lymphatic  mapping.   ANESTHESIA:  General.   SURGEON:  Alfonse Ras, MD.   DESCRIPTION:  The patient was taken to the operating room, placed in  supine position.  After adequate general anesthesia was induced using a  laryngeal mask, the retroareolar tissue was injected with 5 mL of 1%  Lymphazurin blue dye.  The NeoProbe was used to identify an area of semi  high activity with counts only about 15 or so in the right axilla.  This  area was marked.  The right axilla was then prepped and draped in normal  sterile fashion.  A transverse incision was made at the inferior aspect  of the axillary hairline.  I dissected down through subcutaneous tissue  using Bovie electrocautery.  Using the NeoProbe for guidance, a very  small hot lymph node with counts only about 15 was isolated just  posterior to the pectoralis minor muscle.  This was excised and ex vivo  counts were 15 to 17.  This was sent for pathologic evaluation.  Touch  preps were negative for cancer.  The skin was closed with subcuticular 4-  0 Monocryl.  Steri-Strips and sterile dressings were applied.  The  patient tolerated the procedure well and went to PACU in good condition.      Alfonse Ras, MD  Electronically Signed     KRE/MEDQ  D:  08/24/2007  T:  08/24/2007  Job:  [id number]

## 2010-10-03 NOTE — Op Note (Signed)
[place]  Patient:    Anna Navarro, Anna Navarro Visit Number: [id number] MRN: [id number]          Service Type: DSU Location: [facility] Attending Physician:  Twana First Dictated by:   Elana Alm Thurston Hole, M.D. Proc. Date: 04/04/01 Admit Date:  04/04/2001                             Operative Report  PREOPERATIVE DIAGNOSIS:  Left knee lateral meniscus tear.  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  OPERATION: 1. Left knee examination under anesthesia followed by arthroscopic partial    lateral meniscectomy. 2. Left knee chondroplasty.  SURGEON:  Elana Alm. Thurston Hole, M.D.  ASSISTANT:  Julien Girt, P.A.  ANESTHESIA:  Local and MAC.  OPERATIVE TIME:  40 minutes.  COMPLICATIONS:  None.  INDICATIONS:  [name] is a 55 year old woman who has had three to four months of increasing left knee pain with signs and symptoms and MRI documenting a lateral meniscus tear and chondromalacia, who has failed conservative care and is now to undergo arthroscopy.  DESCRIPTION OF PROCEDURE:  Ms. Newborn was brought to the operating room on April 04, 2001, after a block had been placed in the holding area.  She was placed on the operative table in the supine position.  Her left knee was examined under anesthesia.  Range of motion 0-125 degrees, 1 to 2+ crepitation ______ ligaments examined, normal patella tracking.  Left leg was prepped using sterile Betadine and draped using sterile technique.  Originally through an inferolateral portal the arthroscope with a pump attachment was placed, and through an inferomedial portal an arthroscopic probe was placed.  On initial inspection of the medial compartment, the articular cartilage, medial femoral condyle, medial tibial ______ was intact.  The medial meniscus was probed, and this was found to be normal.  Intercondylar notch inspected, anterior and posterior  cruciate ligaments were normal.  Lateral compartment inspected.  She had 25-30% grade 3 chondromalacia of lateral femoral condyle which was debrided.  Lateral tibial plateau was similar, and this was debrided.  She had remnants of a lateral meniscus partial lateral meniscectomy with tearing of the remnants of which another 30-40% was resected, leaving about 30% of the posterior and lateral horn.  Patellofemoral joint inspected.  Articular cartilage in this joint showed only mild grade 1-2 chondromalacia.  The patella tracked normally.  Moderate synovitis in mediolateral gutters were debrided; otherwise, they were free of pathology.  After this was done, it was felt that all pathology had been satisfactorily addressed.  The instruments were removed.  Portals closed with 3-0 nylon suture and injected with 0.25% Marcaine with epinephrine and 4 mg of morphine.  Sterile dressings applied and the patient awakened and taken to the recovery room in stable condition.  FOLLOW-UP:  [name] will be followed as an outpatient, on Vicodin and Naprosyn.  Will see her back in the office in a week for sutures out and follow-up.Dictated by:   Elana Alm Thurston Hole, M.D. Attending Physician:  Twana First DD:  04/04/01 TD:  04/04/01 Job: [id number]

## 2010-10-03 NOTE — Op Note (Signed)
Anna Navarro, BROSSARD NO.:  [id number]   MEDICAL RECORD NO.:  [id number]          PATIENT TYPE:  INP   LOCATION:  1504                         FACILITY:  [facility]   PHYSICIAN:  Alfonse Ras, MD   DATE OF BIRTH:  [birth year]   DATE OF PROCEDURE:  01/25/2006  DATE OF DISCHARGE:                                 OPERATIVE REPORT   PREOPERATIVE DIAGNOSIS:  Medically refractory morbid obesity.   POSTOPERATIVE DIAGNOSES:  1. Medically refractory morbid obesity.  2. Large hiatal hernia.   PROCEDURE:  Repair of hiatal hernia and laparoscopic Roux-en-Y gastric  bypass, antecolic, antegastric, left facing Roux limb.   SURGEON:  Dr. Baruch Merl.   ASSISTANT:  Dr. Wenda Low.   ANESTHESIA:  General.   DESCRIPTION OF PROCEDURE:  The patient was taken to the operating room,  placed in a supine position and after adequate general anesthesia was  induced using endotracheal tube, the abdomen was prepped and draped in the  normal sterile fashion. Using a 12 mm trocar in the left upper quadrant with  Optiview technique under direct vision, peritoneal access was obtained.  Pneumoperitoneum was obtained.  Additional 12 and 5 mm trocars were placed  throughout the abdomen.  Two separate ones in the right upper quadrant, one  additional in the left mid abdomen and one in the left  periumbilical local  region.  The ligament of Treitz was identified and the small bowel was  transected 40 cm distal to the ligament of Treitz using a white load GIA  stapling device.  The Roux limb the distal end was tagged with a Penrose  drain.  Counting an additional 100 cm for the Roux limb, this was placed in  the right upper quadrant.  A side-to-side jejunojejunostomy was performed in  the standard fashion using a harmonic scalpel.  A white load 45 mm GIA  stapling device.  The defect was closed with running 2-0 Vicryl sutures.  The anastomosis was inspected and reinforced with Tisseel.  The  mesenteric  defect was closed with a running 2-0 silk suture.   I then turned my attention to the upper abdomen.  A Nathanson liver  retractor was placed in the subxiphoid region and the left lateral segment  of liver was retracted.  A large hiatal hernia was seen and was reduced.  The hernia sac was taken down.  Using interrupted 2-0 Ethibond sutures  approximating the right and left crus, this was closed anteriorly.   I then turned my attention to the lesser curve and approximately 5 cm distal  to the EG junction which was now in the abdomen, the lesser sac was entered.  Using three sequential loads of the 60 mm GIA stapling device, a small  gastric pouch was made.  The Roux limb was then brought up in a left facing  position and a posterior row was placed using a running 2-0 Vicryl suture.  A gastrojejunostomy was then performed using a 45-mm blue load GIA stapling  device.  The defect was closed with a running 2-0 Vicryl suture.  The  endoscope was then  placed down to the anastomosis and an anterior layer was  run using a running 2-0 Vicryl suture.  The staple line was oversewn using  running 2-0 interlocking silk suture.  Peterson's defect was then closed  using a running 2-0 silk suture as well.  Upper endoscopy showed patency of the anastomosis and no evidence of leak.  The patient tolerated the procedure well. The anastomosis was reinforced  using Tisseel.  Trocars were removed.  Pneumoperitoneum was released and  skin was closed with staples.  The patient tolerated the procedure well and  went to PACU in good condition      Alfonse Ras, MD  Electronically Signed     KRE/MEDQ  D:  01/25/2006  T:  01/26/2006  Job:  [id number]

## 2010-10-03 NOTE — Procedures (Signed)
NAMEBRIDGETTE, Navarro NO.:  [id number]   MEDICAL RECORD NO.:  [id number]          PATIENT TYPE:  OUT   LOCATION:  SLEEP CENTER                 FACILITY:   [facility]   PHYSICIAN:  Marcelyn Bruins, M.D. [facility] DATE OF BIRTH:  July 30, 1955   DATE OF STUDY:  04/16/2005                              NOCTURNAL POLYSOMNOGRAM   REFERRING PHYSICIAN:  Stacie Glaze, M.D. [facility].   DATE OF STUDY:  April 16, 2005.   INDICATION FOR STUDY:  Hypersomnia with sleep apnea.   EPWORTH SLEEPINESS SCORE:  7.   SLEEP ARCHITECTURE:  The patient had a total sleep time of 305 minutes with  significant slow wave sleep but never achieved REM. Sleep onset latency was  increased. Sleep efficiency was significantly decreased at 71%.   RESPIRATORY DATA:  The patient was found to have 33 hypopneas and 2 apneas  for a respiratory disturbance index of 7 events per hour. The events were  not positional but there was mild snoring noted throughout.   OXYGEN DATA:  O2 desaturation was as low as 88% associated with obstructive  events.   CARDIAC DATA:  No clinically significant cardiac arrhythmias.   MOVEMENT/PARASOMNIA:  There were 238 leg jerks with 2 per hour resulting in  arousal or awakening. This is clinically significant.   IMPRESSION/RECOMMENDATIONS:  1.  Mild obstructive sleep apnea/hypopnea syndrome with a respiratory      disturbance index of 7 events per hour and O2 desaturation as low as      88%.  Treatment of this degree of sleep apnea may include weight loss      alone if applicable, upper airway surgery, oral appliance, or C-PAP.      Clinical correlation is suggested.  2.  Very large numbers of leg jerks with significant sleep disruption. This      perhaps may be more of a factor in the patient's sleep disruption than      her actual sleep disordered __________ breathing.           ______________________________  Marcelyn Bruins, M.D. [facility]  Diplomate, American Board of Sleep   Medicine    KC/MEDQ  D:  04/24/2005 16:36:35  T:  04/24/2005 22:42:13  Job:  [id number]

## 2010-10-09 ENCOUNTER — Encounter: Payer: Self-pay | Admitting: Family Medicine

## 2010-10-09 ENCOUNTER — Ambulatory Visit (INDEPENDENT_AMBULATORY_CARE_PROVIDER_SITE_OTHER): Payer: BC Managed Care – PPO | Admitting: Family Medicine

## 2010-10-09 DIAGNOSIS — R0789 Other chest pain: Secondary | ICD-10-CM

## 2010-10-09 DIAGNOSIS — R002 Palpitations: Secondary | ICD-10-CM

## 2010-10-09 DIAGNOSIS — R42 Dizziness and giddiness: Secondary | ICD-10-CM

## 2010-10-09 NOTE — Patient Instructions (Signed)
Followup immediately for any exertion related chest pains, progressive dyspnea, or any new neurologic symptoms Minimize caffeine use.

## 2010-10-09 NOTE — Progress Notes (Signed)
  Subjective:    Patient ID: Anna Navarro, female    DOB: [birth year], 55 y.o.   MRN: [id number]  HPI Patient seen as a work in with several complaints as follows  Patient relates intermittent palpitations past week and a half. Symptoms are somewhat brief usually lasting a few minutes. She has not taken her pulse. Minimal caffeine use. Some increased work stress recently. Denies any syncopal or presyncopal symptoms. No clear exacerbating features. Exercising without difficulty.  Some recent increased fatigue. Poor sleep which may be stress related. No significant depressive symptoms. Sharp left precordial chest pain at rest lasting only a few seconds. No clear exacerbating features. Not related to exercise. She's had a couple of brief episodes again only lasting a few seconds. No clear triggering factors  Vertigo past couple days. Symptoms usually brief and mild. Minimal nausea without vomiting. Worsened by head movement. No ataxia. No focal weakness. Denies headaches. No diplopia.   Review of Systems  Constitutional: Positive for fatigue. Negative for fever, chills, activity change, appetite change and unexpected weight change.  Respiratory: Negative for cough, shortness of breath and wheezing.   Cardiovascular: Positive for chest pain and palpitations. Negative for leg swelling.  Gastrointestinal: Negative for abdominal pain.  Neurological: Positive for dizziness. Negative for syncope, light-headedness and headaches.  Hematological: Negative for adenopathy.       Objective:   Physical Exam  Constitutional: She is oriented to person, place, and time. She appears well-developed and well-nourished.  HENT:  Head: Normocephalic and atraumatic.  Right Ear: External ear normal.  Left Ear: External ear normal.  Mouth/Throat: Oropharynx is clear and moist. No oropharyngeal exudate.  Eyes: Pupils are equal, round, and reactive to light.  Neck: Neck supple. No thyromegaly present.    Cardiovascular: Normal rate, regular rhythm and normal heart sounds.  Exam reveals no gallop.   No murmur heard. Pulmonary/Chest: Effort normal and breath sounds normal. No respiratory distress. She has no wheezes. She has no rales.  Abdominal: There is no tenderness.  Musculoskeletal: She exhibits no edema.  Lymphadenopathy:    She has no cervical adenopathy.  Neurological: She is alert and oriented to person, place, and time. No cranial nerve deficit.       Cerebellar normal finger to nose testing. No focal strength deficits.  Skin: No rash noted.  Psychiatric: She has a normal mood and affect. Her behavior is normal.          Assessment & Plan:  Patient presents with multisymptom complex.   Suspect these are not interrelated.  #1 palpitations. Obtain EKG-NSR with no acute findings. Check TSH. Suspect benign. No associated red flags. If persists consider event monitor #2 vertigo. Suspect positional vertigo. Reassurance given and observation #3 atypical chest pain. EKG. No associated exercise symptoms. Symptoms are very atypical in that they're brief and sharp usually on lasting a few seconds.  No association with exercise.  Reassurance given.

## 2010-10-10 LAB — TSH: TSH: 1.23 u[IU]/mL (ref 0.35–5.50)

## 2010-10-14 NOTE — Progress Notes (Signed)
Quick Note:  Pt informed ______ 

## 2010-11-25 ENCOUNTER — Ambulatory Visit
Admission: RE | Admit: 2010-11-25 | Discharge: 2010-11-25 | Disposition: A | Discharge status: Home / Self Care | Payer: BC Managed Care – PPO | Source: Ambulatory Visit | Attending: Oncology | Admitting: Oncology

## 2010-11-25 DIAGNOSIS — Z780 Asymptomatic menopausal state: Secondary | ICD-10-CM

## 2010-12-30 ENCOUNTER — Other Ambulatory Visit: Payer: Self-pay | Admitting: Oncology

## 2010-12-30 ENCOUNTER — Encounter (HOSPITAL_BASED_OUTPATIENT_CLINIC_OR_DEPARTMENT_OTHER): Payer: BC Managed Care – PPO | Admitting: Oncology

## 2010-12-30 DIAGNOSIS — C50419 Malignant neoplasm of upper-outer quadrant of unspecified female breast: Secondary | ICD-10-CM

## 2010-12-30 LAB — CBC WITH DIFFERENTIAL/PLATELET
BASO%: 0.6 % (ref 0.0–2.0)
Basophils Absolute: 0 10*3/uL (ref 0.0–0.1)
EOS%: 1.6 % (ref 0.0–7.0)
Eosinophils Absolute: 0.1 10*3/uL (ref 0.0–0.5)
HCT: 39.7 % (ref 34.8–46.6)
HGB: 13.8 g/dL (ref 11.6–15.9)
LYMPH%: 28.6 % (ref 14.0–49.7)
MCH: 31.7 pg (ref 25.1–34.0)
MCHC: 34.7 g/dL (ref 31.5–36.0)
MCV: 91.4 fL (ref 79.5–101.0)
MONO#: 0.4 10*3/uL (ref 0.1–0.9)
MONO%: 6.5 % (ref 0.0–14.0)
NEUT#: 3.9 10*3/uL (ref 1.5–6.5)
NEUT%: 62.7 % (ref 38.4–76.8)
Platelets: 210 10*3/uL (ref 145–400)
RBC: 4.35 10*6/uL (ref 3.70–5.45)
RDW: 12.2 % (ref 11.2–14.5)
WBC: 6.3 10*3/uL (ref 3.9–10.3)
lymph#: 1.8 10*3/uL (ref 0.9–3.3)

## 2010-12-30 LAB — COMPREHENSIVE METABOLIC PANEL
ALT: 21 U/L (ref 0–35)
AST: 19 U/L (ref 0–37)
Albumin: 3.9 g/dL (ref 3.5–5.2)
Alkaline Phosphatase: 46 U/L (ref 39–117)
BUN: 17 mg/dL (ref 6–23)
CO2: 31 mEq/L (ref 19–32)
Calcium: 9.3 mg/dL (ref 8.4–10.5)
Chloride: 104 mEq/L (ref 96–112)
Creatinine, Ser: 0.8 mg/dL (ref 0.50–1.10)
Glucose, Bld: 87 mg/dL (ref 70–99)
Potassium: 4 mEq/L (ref 3.5–5.3)
Sodium: 140 mEq/L (ref 135–145)
Total Bilirubin: 0.2 mg/dL — ABNORMAL LOW (ref 0.3–1.2)
Total Protein: 6.8 g/dL (ref 6.0–8.3)

## 2010-12-31 LAB — CANCER ANTIGEN 27.29: CA 27.29: 14 U/mL (ref 0–39)

## 2010-12-31 LAB — VITAMIN D 25 HYDROXY (VIT D DEFICIENCY, FRACTURES): Vit D, 25-Hydroxy: 43 ng/mL (ref 30–89)

## 2011-02-09 ENCOUNTER — Telehealth: Payer: Self-pay | Admitting: Internal Medicine

## 2011-02-09 LAB — BASIC METABOLIC PANEL
BUN: 12
CO2: 31
Calcium: 8.9
Chloride: 104
Creatinine, Ser: 0.64
GFR calc Af Amer: >60
GFR calc non Af Amer: >60
Glucose, Bld: 111 — ABNORMAL HIGH
Potassium: 4.1
Sodium: 141

## 2011-02-09 LAB — CBC
HCT: 39.5
Hemoglobin: 13.4
MCHC: 34
MCV: 90.2
Platelets: 211
RBC: 4.38
RDW: 12.9
WBC: 5.9

## 2011-02-09 NOTE — Telephone Encounter (Signed)
Left message for pt informing her she needed a office visit in November

## 2011-02-09 NOTE — Telephone Encounter (Signed)
Pt called asking if she is due for lab work and a follow up. I did not see anything in her office notes about a follow up. Please advise

## 2011-02-09 NOTE — Telephone Encounter (Signed)
Probably time for another appointment- no opening until November ,so make ov in nov and he can order labs at [facility] if needed

## 2011-02-10 LAB — POCT HEMOGLOBIN-HEMACUE: Hemoglobin: 15.7 — ABNORMAL HIGH

## 2011-03-05 LAB — POCT URINALYSIS DIP (DEVICE)
Bilirubin Urine: NEGATIVE
Glucose, UA: NEGATIVE
Nitrite: NEGATIVE
Operator id: 270961
Protein, ur: NEGATIVE
Specific Gravity, Urine: 1.02
Urobilinogen, UA: 1
pH: 6

## 2011-03-11 ENCOUNTER — Encounter: Payer: Self-pay | Admitting: Internal Medicine

## 2011-03-11 ENCOUNTER — Ambulatory Visit (INDEPENDENT_AMBULATORY_CARE_PROVIDER_SITE_OTHER): Payer: BC Managed Care – PPO | Admitting: Internal Medicine

## 2011-03-11 DIAGNOSIS — J069 Acute upper respiratory infection, unspecified: Secondary | ICD-10-CM

## 2011-03-11 NOTE — Patient Instructions (Signed)
Please call our office if your symptoms do not improve or gets worse.  

## 2011-03-11 NOTE — Progress Notes (Signed)
  Subjective:    Patient ID: Anna Navarro, female    DOB: [birth year], 55 y.o.   MRN: [id number]  URI  This is a new problem. The current episode started in the past 7 days. The problem has been unchanged. There has been no fever. Associated symptoms include congestion, coughing, ear pain and a sore throat. She has tried acetaminophen and decongestant for the symptoms. The treatment provided mild relief.      Review of Systems  HENT: Positive for ear pain, congestion and sore throat.   Respiratory: Positive for cough.     Past Medical History  Diagnosis Date  . ALLERGIC RHINITIS 02/03/2007  . 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 DEPRESSION 11/12/2006  . 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 GERD 11/12/2006  . HYPERLIPIDEMIA 11/12/2006  . 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 LOW BACK PAIN 02/03/2007  . NECK MASS 01/03/2007  . WARTS, HAND 01/07/2010  . WEIGHT LOSS, RECENT 02/03/2007    History   Social History  . Marital Status: Married    Spouse Name: N/A    Number of Children: N/A  . Years of Education: N/A   Occupational History  . Not on file.   Social History Main Topics  . Smoking status: Never Smoker   . Smokeless tobacco: Not on file  . Alcohol Use: No  . Drug Use:   . Sexually Active:    Other Topics Concern  . Not on file   Social History Narrative  . No narrative on file    Past Surgical History  Procedure Date  . Gastric bypass   . Total abdominal hysterectomy     No family history on file.  No Known Allergies  Current Outpatient Prescriptions on File Prior to Visit  Medication Sig Dispense Refill  . alendronate (FOSAMAX) 70 MG tablet Take 70 mg by mouth every 7 (seven) days. Take with a full glass of water on an empty stomach.       . Calcium Carbonate (CALCIUM 500 PO) Take by mouth daily.        . Cholecalciferol (VITAMIN D) 2000 UNITS CAPS Take by mouth daily.        .  Cyanocobalamin (VITAMIN B 12) 100 MCG LOZG Take by mouth daily.        Marland Kitchen letrozole (FEMARA) 2.5 MG tablet Take 2.5 mg by mouth daily. As directed         BP 120/80  Temp(Src) 98 F (36.7 C) (Oral)  Ht 5' 1.5" (1.562 m)  Wt 143 lb (64.864 kg)  BMI 26.58 kg/m2       Objective:   Physical Exam   Constitutional: Appears well-developed and well-nourished. No distress.  Head: Normocephalic and atraumatic.  Ear:  Left and right TM dull and retracted  Mouth/Throat: Oropharynx is clear and moist. mild redness, no exudates Neck: Normal range of motion. Neck supple. No thyromegaly present. No adenopathy Cardiovascular: Normal rate, regular rhythm and normal heart sounds.  Exam reveals no gallop and no friction rub.   No murmur heard. Pulmonary/Chest: Effort normal and breath sounds normal.  No wheezes. No rales.  psychiatric: Normal mood and affect. Behavior is normal.        Assessment & Plan:

## 2011-03-11 NOTE — Assessment & Plan Note (Signed)
Probable viral URI. Symptomatic treatment discussed.  Patient advised to call office if symptoms persist or worsen.

## 2011-03-12 ENCOUNTER — Telehealth: Payer: Self-pay

## 2011-03-12 MED ORDER — AZITHROMYCIN 250 MG PO TABS: ORAL_TABLET | ORAL | Status: AC

## 2011-03-12 NOTE — Telephone Encounter (Signed)
Per dr jenkins-may have z pack 

## 2011-03-12 NOTE — Telephone Encounter (Signed)
Pt called and stated she saw Dr. Artist Pais yesterday and was told to call back if she was feeling worse.  Pt states that she feels much worse today and her throat is really hurting. Pt states she would like to try an alternate medication because she is going out of town this weekend.  Please send to [facility] on [address]

## 2011-03-12 NOTE — Telephone Encounter (Signed)
Addended by: Azucena Freed on: 03/12/2011 09:45 AM   Modules accepted: Orders

## 2011-03-12 NOTE — Telephone Encounter (Signed)
rx sent in to pharmacy.  Left a message to make pt aware.

## 2011-04-06 ENCOUNTER — Encounter: Payer: Self-pay | Admitting: Internal Medicine

## 2011-04-06 ENCOUNTER — Ambulatory Visit (INDEPENDENT_AMBULATORY_CARE_PROVIDER_SITE_OTHER): Payer: BC Managed Care – PPO | Admitting: Internal Medicine

## 2011-04-06 DIAGNOSIS — K219 Gastro-esophageal reflux disease without esophagitis: Secondary | ICD-10-CM

## 2011-04-06 DIAGNOSIS — F3289 Other specified depressive episodes: Secondary | ICD-10-CM

## 2011-04-06 DIAGNOSIS — F329 Major depressive disorder, single episode, unspecified: Secondary | ICD-10-CM

## 2011-04-06 DIAGNOSIS — Z9884 Bariatric surgery status: Secondary | ICD-10-CM

## 2011-04-06 MED ORDER — OMEPRAZOLE 20 MG PO CPDR: 20.0000 mg | DELAYED_RELEASE_CAPSULE | Freq: Every day | ORAL | Status: DC

## 2011-04-06 NOTE — Patient Instructions (Addendum)
The patient is instructed to continue all medications as prescribed. Schedule followup with check out clerk upon leaving the clinic  

## 2011-04-06 NOTE — Progress Notes (Signed)
  Subjective:    Patient ID: Anna Navarro, female    DOB: 11-08-1955, 55 y.o.   MRN: [id number]  HPI    Review of Systems     Objective:   Physical Exam        Assessment & Plan:

## 2011-04-07 LAB — TSH: TSH: 0.78 u[IU]/mL (ref 0.35–5.50)

## 2011-04-07 LAB — HEPATIC FUNCTION PANEL
ALT: 22 U/L (ref 0–35)
AST: 23 U/L (ref 0–37)
Albumin: 4.3 g/dL (ref 3.5–5.2)
Alkaline Phosphatase: 48 U/L (ref 39–117)
Bilirubin, Direct: 0 mg/dL (ref 0.0–0.3)
Total Bilirubin: 0.5 mg/dL (ref 0.3–1.2)
Total Protein: 7.1 g/dL (ref 6.0–8.3)

## 2011-04-07 LAB — CBC
HCT: 39.3 % (ref 36.0–46.0)
Hemoglobin: 13.4 g/dL (ref 12.0–15.0)
MCHC: 34 g/dL (ref 30.0–36.0)
MCV: 94.1 fl (ref 78.0–100.0)
Platelets: 200 10*3/uL (ref 150.0–400.0)
RBC: 4.18 Mil/uL (ref 3.87–5.11)
RDW: 12.7 % (ref 11.5–14.6)
WBC: 5.3 10*3/uL (ref 4.5–10.5)

## 2011-04-07 LAB — VITAMIN B12: Vitamin B-12: >1500 pg/mL — ABNORMAL HIGH (ref 211–911)

## 2011-04-07 LAB — IRON: Iron: 56 ug/dL (ref 42–145)

## 2011-04-12 ENCOUNTER — Other Ambulatory Visit: Payer: Self-pay | Admitting: Oncology

## 2011-04-25 ENCOUNTER — Telehealth: Payer: Self-pay | Admitting: Oncology

## 2011-04-25 NOTE — Telephone Encounter (Signed)
called pts and scheduled appts for feb2013 per pof 08/14

## 2011-06-23 ENCOUNTER — Other Ambulatory Visit (HOSPITAL_BASED_OUTPATIENT_CLINIC_OR_DEPARTMENT_OTHER): Payer: BC Managed Care – PPO | Admitting: Lab

## 2011-06-23 DIAGNOSIS — C50419 Malignant neoplasm of upper-outer quadrant of unspecified female breast: Secondary | ICD-10-CM

## 2011-06-23 LAB — COMPREHENSIVE METABOLIC PANEL
ALT: 15 U/L (ref 0–35)
AST: 14 U/L (ref 0–37)
Albumin: 3.8 g/dL (ref 3.5–5.2)
Alkaline Phosphatase: 47 U/L (ref 39–117)
BUN: 15 mg/dL (ref 6–23)
CO2: 30 mEq/L (ref 19–32)
Calcium: 9.4 mg/dL (ref 8.4–10.5)
Chloride: 103 mEq/L (ref 96–112)
Creatinine, Ser: 0.85 mg/dL (ref 0.50–1.10)
Glucose, Bld: 106 mg/dL — ABNORMAL HIGH (ref 70–99)
Potassium: 4 mEq/L (ref 3.5–5.3)
Sodium: 140 mEq/L (ref 135–145)
Total Bilirubin: 0.2 mg/dL — ABNORMAL LOW (ref 0.3–1.2)
Total Protein: 6.8 g/dL (ref 6.0–8.3)

## 2011-06-23 LAB — CBC WITH DIFFERENTIAL/PLATELET
BASO%: 0.6 % (ref 0.0–2.0)
Basophils Absolute: 0 10*3/uL (ref 0.0–0.1)
EOS%: 1.1 % (ref 0.0–7.0)
Eosinophils Absolute: 0.1 10*3/uL (ref 0.0–0.5)
HCT: 39.4 % (ref 34.8–46.6)
HGB: 13.3 g/dL (ref 11.6–15.9)
LYMPH%: 31.4 % (ref 14.0–49.7)
MCH: 31.2 pg (ref 25.1–34.0)
MCHC: 33.8 g/dL (ref 31.5–36.0)
MCV: 92.3 fL (ref 79.5–101.0)
MONO#: 0.3 10*3/uL (ref 0.1–0.9)
MONO%: 7.3 % (ref 0.0–14.0)
NEUT#: 2.9 10*3/uL (ref 1.5–6.5)
NEUT%: 59.6 % (ref 38.4–76.8)
Platelets: 219 10*3/uL (ref 145–400)
RBC: 4.27 10*6/uL (ref 3.70–5.45)
RDW: 12.5 % (ref 11.2–14.5)
WBC: 4.8 10*3/uL (ref 3.9–10.3)
lymph#: 1.5 10*3/uL (ref 0.9–3.3)

## 2011-06-23 LAB — LACTATE DEHYDROGENASE: LDH: 162 U/L (ref 94–250)

## 2011-06-24 ENCOUNTER — Other Ambulatory Visit: Payer: BC Managed Care – PPO | Admitting: Lab

## 2011-06-24 LAB — VITAMIN D 25 HYDROXY (VIT D DEFICIENCY, FRACTURES): Vit D, 25-Hydroxy: 38 ng/mL (ref 30–89)

## 2011-06-25 ENCOUNTER — Other Ambulatory Visit: Payer: BC Managed Care – PPO | Admitting: Lab

## 2011-07-02 ENCOUNTER — Ambulatory Visit (HOSPITAL_BASED_OUTPATIENT_CLINIC_OR_DEPARTMENT_OTHER): Payer: BC Managed Care – PPO | Admitting: Oncology

## 2011-07-02 ENCOUNTER — Other Ambulatory Visit: Payer: Self-pay | Admitting: Physician Assistant

## 2011-07-02 VITALS — BP 133/82 | HR 80 | Temp 98.7°F | Wt 143.6 lb

## 2011-07-02 DIAGNOSIS — N644 Mastodynia: Secondary | ICD-10-CM

## 2011-07-02 DIAGNOSIS — C50919 Malignant neoplasm of unspecified site of unspecified female breast: Secondary | ICD-10-CM

## 2011-07-02 DIAGNOSIS — C50911 Malignant neoplasm of unspecified site of right female breast: Secondary | ICD-10-CM

## 2011-07-02 MED ORDER — GABAPENTIN 100 MG PO CAPS: 300.0000 mg | ORAL_CAPSULE | Freq: Three times a day (TID) | ORAL | Status: DC

## 2011-07-02 NOTE — Progress Notes (Signed)
Hematology and Oncology Follow Up Visit  Anna Navarro [id number] [birth year] 56 y.o. 07/02/2011    HPI: Anna Navarro is a 56 year old [place] woman with a history of a T1b, N0, ER/PR positive right breast carcinoma for which she underwent a right lumpectomy with sentinel node dissection in April of 2009. She completed radiation therapy, and was placed on Femara 2.5 mg by mouth daily as of 11/24/2007.  Interim History:   Anna Navarro presents for office today for her 6 month followup dictating for a history of a stage I, ER/PR positive right breast carcinoma. She is feeling well, denying any unexplained fevers chills night sweats shortness of breath or chest pain though she has persistent burning and stinging pain in her right breast which has been chronic in nature.she denies any nausea, emesis, diarrhea or constipation issues. She will be due for her annual mammogram and biannual breast MRI in March 2013. Excluding the pain within the right breast, she denies any palpable masses. No nipple inversion or discharge.  A detailed review of systems is otherwise noncontributory as noted below.  Review of Systems: Constitutional:  no weight loss, fever, night sweats and feels well Eyes: no complaints ENT: no complaints Cardiovascular: no chest pain or dyspnea on exertion Respiratory: no cough, shortness of breath, or wheezing Neurological: no TIA or stroke symptoms Dermatological: negative Gastrointestinal: no abdominal pain, change in bowel habits, or black or bloody stools Genito-Urinary: no dysuria, trouble voiding, or hematuria Hematological and Lymphatic: negative Breast: persistant R breast pain over lumpectomy site. Musculoskeletal: negative Remaining ROS negative.   Medications:   I have reviewed the patient's current medications.  Current Outpatient Prescriptions  Medication Sig Dispense Refill  . alendronate (FOSAMAX) 70 MG tablet TAKE 1 TABLET BY MOUTH ONCE WEEKLY  12  tablet  0  . Cholecalciferol (VITAMIN D) 2000 UNITS CAPS Take by mouth daily.        . Cyanocobalamin (VITAMIN B 12) 100 MCG LOZG Take by mouth daily.        Marland Kitchen letrozole (FEMARA) 2.5 MG tablet Take 2.5 mg by mouth daily. As directed       . nitrofurantoin (MACRODANTIN) 100 MG capsule Take 100 mg by mouth daily.      . Calcium Carbonate (CALCIUM 500 PO) Take by mouth daily.        Marland Kitchen gabapentin (NEURONTIN) 100 MG capsule Take 3 capsules (300 mg total) by mouth 3 (three) times daily.  90 capsule  1  . omeprazole (PRILOSEC) 20 MG capsule Take 1 capsule (20 mg total) by mouth daily.  30 capsule  11    Allergies: No Known Allergies  Physical Exam: Filed Vitals:   07/02/11 1557  BP: 133/82  Pulse: 80  Temp: 98.7 F (37.1 C)  Weight: 143 lbs. HEENT:  Sclerae anicteric, conjunctivae pink.  Oropharynx clear.  No mucositis or candidiasis.   Nodes:  No cervical, supraclavicular, or axillary lymphadenopathy palpated.  Breast Exam:  The breast was examined, the right lumpectomy scar is benign without any dominant mass effect or nipple inversion or discharge. There is no evidence of diffuse erythema the breast itself is quite tender especially of the lumpectomy site. The axilla is clear. The left breast is free of masses skin changes nipple inversion or discharge axilla clear.  Lungs:  Clear to auscultation bilaterally.  No crackles, rhonchi, or wheezes.   Heart:  Regular rate and rhythm.   Abdomen:  Soft, nontender.  Positive bowel sounds.  No organomegaly or masses palpated.  Musculoskeletal:  No focal spinal tenderness to palpation.  Extremities:  Benign.  No peripheral edema or cyanosis.   Skin:  Benign.   Neuro:  Nonfocal.   Lab Results: Lab Results  Component Value Date   WBC 4.8 06/23/2011   HGB 13.3 06/23/2011   HCT 39.4 06/23/2011   MCV 92.3 06/23/2011   PLT 219 06/23/2011   NEUTROABS 2.9 06/23/2011     Chemistry      Component Value Date/Time   NA 140 06/23/2011 1505   K 4.0 06/23/2011 1505    CL 103 06/23/2011 1505   CO2 30 06/23/2011 1505   BUN 15 06/23/2011 1505   CREATININE 0.85 06/23/2011 1505      Component Value Date/Time   CALCIUM 9.4 06/23/2011 1505   ALKPHOS 47 06/23/2011 1505   AST 14 06/23/2011 1505   ALT 15 06/23/2011 1505   BILITOT 0.2* 06/23/2011 1505        Radiological Studies: 07/31/10 DIGITAL DIAGNOSTIC BILATERAL MAMMOGRAM with CAD  Comparison: 07/24/2009 and prior  Findings: MLO and CC views of both breasts and a spot tangential  view of the lumpectomy site performed. Scarring in the right breast  again identified. There is no evidence of mass, nonsurgical  distortion, or suspicious calcifications.  Scattered fibroglandular densities bilaterally are again noted.  Mammographic images were processed with CAD.  IMPRESSION:  No specific mammographic evidence of malignancy.  Right breast scarring.  These findings were discussed with the patient. She was encouraged  to begin/continue monthly self exams and to contact her primary  physician if any changes noted.  BI-RADS CATEGORY 2: Benign finding(s).  Recommend bilateral diagnostic mammograms in 1 year.  Original Report Authenticated By: Rosendo Gros, M.D.         Assessment:  Anna Navarro is a 56 year old [place] woman with a history of a T1b, N0, ER/PR positive right breast carcinoma for which she underwent a right lumpectomy with sentinel node dissection in April of 2009. She completed radiation therapy, and was placed on Femara 2.5 mg by mouth daily as of 11/24/2007. No evidence of recurrent or metastatic disease. 2. Persistant R breast pain.  Case reviewed with Dr. Pierce Crane who also spoke with patient.  Plan:  Anna Navarro will continue on Femara 2.5 mg by mouth daily. She is willing to try gabapentin 100 mg by mouth 3 times a day, therefore prescription was given. She will be scheduled for her annual diagnostic mammogram [facility].we will plan on  seeing her back in 6 months for routine oncologic followup which will include a CBC serum chemistries LDH CA 27-29 and vitamin D level.  She knows to contact us in the interim if he should arise.   This plan was reviewed with the patient, who voices understanding and agreement.  She knows to call with any changes or problems.    Brondon Wann T, PA-C 07/02/2011

## 2011-07-03 ENCOUNTER — Encounter: Payer: Self-pay | Admitting: *Deleted

## 2011-07-19 ENCOUNTER — Other Ambulatory Visit: Payer: Self-pay | Admitting: Oncology

## 2011-07-19 DIAGNOSIS — C50919 Malignant neoplasm of unspecified site of unspecified female breast: Secondary | ICD-10-CM

## 2011-07-19 DIAGNOSIS — C50419 Malignant neoplasm of upper-outer quadrant of unspecified female breast: Secondary | ICD-10-CM

## 2011-08-03 ENCOUNTER — Ambulatory Visit
Admission: RE | Admit: 2011-08-03 | Discharge: 2011-08-03 | Disposition: A | Discharge status: Home / Self Care | Payer: BC Managed Care – PPO | Source: Ambulatory Visit | Attending: Physician Assistant | Admitting: Physician Assistant

## 2011-08-03 DIAGNOSIS — N644 Mastodynia: Secondary | ICD-10-CM

## 2011-08-03 DIAGNOSIS — C50911 Malignant neoplasm of unspecified site of right female breast: Secondary | ICD-10-CM

## 2011-08-05 ENCOUNTER — Ambulatory Visit: Payer: BC Managed Care – PPO | Admitting: Internal Medicine

## 2011-08-06 ENCOUNTER — Ambulatory Visit (HOSPITAL_COMMUNITY)
Admission: RE | Admit: 2011-08-06 | Discharge: 2011-08-06 | Disposition: A | Discharge status: Home / Self Care | Payer: BC Managed Care – PPO | Source: Ambulatory Visit | Attending: Physician Assistant | Admitting: Physician Assistant

## 2011-08-06 ENCOUNTER — Inpatient Hospital Stay (HOSPITAL_COMMUNITY): Admission: RE | Admit: 2011-08-06 | Payer: BC Managed Care – PPO | Source: Ambulatory Visit

## 2011-08-06 DIAGNOSIS — N644 Mastodynia: Secondary | ICD-10-CM

## 2011-08-06 DIAGNOSIS — C50919 Malignant neoplasm of unspecified site of unspecified female breast: Secondary | ICD-10-CM | POA: Insufficient documentation

## 2011-08-06 DIAGNOSIS — Z923 Personal history of irradiation: Secondary | ICD-10-CM | POA: Insufficient documentation

## 2011-08-06 DIAGNOSIS — C50911 Malignant neoplasm of unspecified site of right female breast: Secondary | ICD-10-CM

## 2011-08-06 DIAGNOSIS — K7689 Other specified diseases of liver: Secondary | ICD-10-CM | POA: Insufficient documentation

## 2011-08-06 MED ORDER — GADOBENATE DIMEGLUMINE 529 MG/ML IV SOLN
13.0000 mL | Freq: Once | INTRAVENOUS | Status: AC | PRN
  Administered 2011-08-06: 13 mL via INTRAVENOUS

## 2011-08-17 ENCOUNTER — Ambulatory Visit: Payer: BC Managed Care – PPO | Admitting: Internal Medicine

## 2011-08-20 ENCOUNTER — Ambulatory Visit (INDEPENDENT_AMBULATORY_CARE_PROVIDER_SITE_OTHER): Payer: BC Managed Care – PPO | Admitting: Internal Medicine

## 2011-08-20 VITALS — BP 120/80 | HR 72 | Temp 98.6°F | Resp 14 | Ht 62.0 in | Wt 144.0 lb

## 2011-08-20 DIAGNOSIS — N912 Amenorrhea, unspecified: Secondary | ICD-10-CM

## 2011-08-20 DIAGNOSIS — Z9884 Bariatric surgery status: Secondary | ICD-10-CM

## 2011-08-20 DIAGNOSIS — K219 Gastro-esophageal reflux disease without esophagitis: Secondary | ICD-10-CM

## 2011-08-20 LAB — IRON: Iron: 69 ug/dL (ref 42–145)

## 2011-08-20 LAB — TSH: TSH: 1.13 u[IU]/mL (ref 0.35–5.50)

## 2011-08-20 LAB — VITAMIN B12

## 2011-08-20 MED ORDER — VITAMIN D3 125 MCG (5000 UT) PO TABS: 1.0000 | ORAL_TABLET | Freq: Every day | ORAL | Status: AC

## 2011-08-20 NOTE — Patient Instructions (Signed)
The patient is instructed to continue all medications as prescribed. Schedule followup with check out clerk upon leaving the clinic  

## 2011-08-28 ENCOUNTER — Encounter: Payer: Self-pay | Admitting: Internal Medicine

## 2011-08-28 NOTE — Progress Notes (Signed)
Subjective:    Patient ID: Anna Navarro, female    DOB: [birth year], 56 y.o.   MRN: [id number]  HPI  Patient presents for monitoring of blood work status post bariatric surgery.  She has a history of hyperlipidemia gastroesophageal reflux and osteoarthritis she had a history prior to her gastric bypass of iron deficiency anemia and needs an iron a CBC monitored as well as B12 she had other blood work done at her gynecologist including a thyroid basic metabolic panel and liver functions.  All of these are reported to be normal she generally feels well she has been exercising regularly and her weight has been stable  Review of Systems  Constitutional: Negative for activity change, appetite change and fatigue.  HENT: Negative for ear pain, congestion, neck pain, postnasal drip and sinus pressure.   Eyes: Negative for redness and visual disturbance.  Respiratory: Negative for cough, shortness of breath and wheezing.   Gastrointestinal: Negative for abdominal pain and abdominal distention.  Genitourinary: Negative for dysuria, frequency and menstrual problem.  Musculoskeletal: Negative for myalgias, joint swelling and arthralgias.  Skin: Negative for rash and wound.  Neurological: Negative for dizziness, weakness and headaches.  Hematological: Negative for adenopathy. Does not bruise/bleed easily.  Psychiatric/Behavioral: Negative for sleep disturbance and decreased concentration.   Past Medical History  Diagnosis Date  . ALLERGIC RHINITIS 02/03/2007  . 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 DEPRESSION 11/12/2006  . 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 GERD 11/12/2006  . HYPERLIPIDEMIA 11/12/2006  . 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 LOW BACK PAIN 02/03/2007  . NECK MASS 01/03/2007  . WARTS, HAND 01/07/2010  . WEIGHT LOSS, RECENT 02/03/2007    History   Social History  . Marital Status: Married    Spouse Name:  N/A    Number of Children: N/A  . Years of Education: N/A   Occupational History  . Not on file.   Social History Main Topics  . Smoking status: Never Smoker   . Smokeless tobacco: Not on file  . Alcohol Use: No  . Drug Use:   . Sexually Active:    Other Topics Concern  . Not on file   Social History Narrative  . No narrative on file    Past Surgical History  Procedure Date  . Gastric bypass   . Total abdominal hysterectomy     No family history on file.  No Known Allergies  Current Outpatient Prescriptions on File Prior to Visit  Medication Sig Dispense Refill  . alendronate (FOSAMAX) 70 MG tablet TAKE 1 TABLET BY MOUTH ONCE WEEKLY  12 tablet  1  . Cholecalciferol (VITAMIN D) 2000 UNITS CAPS Take by mouth daily.        . Cyanocobalamin (VITAMIN B 12) 100 MCG LOZG Take by mouth daily.        Marland Kitchen gabapentin (NEURONTIN) 100 MG capsule Take 100 mg by mouth 3 (three) times daily.      Marland Kitchen letrozole (FEMARA) 2.5 MG tablet Take 2.5 mg by mouth daily. As directed       . nitrofurantoin (MACRODANTIN) 100 MG capsule Take 100 mg by mouth 3 (three) times a week.       Marland Kitchen omeprazole (PRILOSEC) 20 MG capsule Take 1 capsule (20 mg total) by mouth daily.  30 capsule  11    BP 120/80  Pulse 72  Temp 98.6 F (37 C)  Resp 14  Ht 5\' 2"  (1.575  m)  Wt 144 lb (65.318 kg)  BMI 26.34 kg/m2        Objective:   Physical Exam  Nursing note and vitals reviewed. Constitutional: She is oriented to person, place, and time. She appears well-developed and well-nourished. No distress.  HENT:  Head: Normocephalic and atraumatic.  Right Ear: External ear normal.  Left Ear: External ear normal.  Nose: Nose normal.  Mouth/Throat: Oropharynx is clear and moist.  Eyes: Conjunctivae and EOM are normal. Pupils are equal, round, and reactive to light.  Neck: Normal range of motion. Neck supple. No JVD present. No tracheal deviation present. No thyromegaly present.  Cardiovascular: Normal rate,  regular rhythm, normal heart sounds and intact distal pulses.   No murmur heard. Pulmonary/Chest: Effort normal and breath sounds normal. She has no wheezes. She exhibits no tenderness.  Abdominal: Soft. Bowel sounds are normal.  Musculoskeletal: Normal range of motion. She exhibits no edema and no tenderness.  Lymphadenopathy:    She has no cervical adenopathy.  Neurological: She is alert and oriented to person, place, and time. She has normal reflexes. No cranial nerve deficit.  Skin: Skin is warm and dry. She is not diaphoretic.  Psychiatric: She has a normal mood and affect. Her behavior is normal.          Assessment & Plan:  Status post bariatric surgery status normal PNET and liver functions per gynecologist.  History of iron deficiency anemia but her iron CBC today B12 levels also monitor because of altered gastric absorption due to bypass surgery status additionally she appears to be so and we discussed her basic nutrition the stability of her weight exercise controlling stress in her life  Her gastroesophageal reflux was discussed and the need to stay on a proton pump inhibitor long term given her risk for ulceration at the anastomosis as well as her history of GERD

## 2011-09-06 ENCOUNTER — Other Ambulatory Visit: Payer: Self-pay | Admitting: Physician Assistant

## 2011-09-06 DIAGNOSIS — C50419 Malignant neoplasm of upper-outer quadrant of unspecified female breast: Secondary | ICD-10-CM

## 2011-09-06 DIAGNOSIS — C50919 Malignant neoplasm of unspecified site of unspecified female breast: Secondary | ICD-10-CM

## 2011-10-22 ENCOUNTER — Telehealth: Payer: Self-pay | Admitting: *Deleted

## 2011-10-22 NOTE — Telephone Encounter (Signed)
called patient and informed the patient that her appointment is not in June it is in August and has always been in August patient stated she must have wrote down the wrong month

## 2011-12-29 ENCOUNTER — Other Ambulatory Visit (HOSPITAL_BASED_OUTPATIENT_CLINIC_OR_DEPARTMENT_OTHER): Payer: BC Managed Care – PPO | Admitting: Lab

## 2011-12-29 DIAGNOSIS — N644 Mastodynia: Secondary | ICD-10-CM

## 2011-12-29 DIAGNOSIS — C50911 Malignant neoplasm of unspecified site of right female breast: Secondary | ICD-10-CM

## 2011-12-29 DIAGNOSIS — C50919 Malignant neoplasm of unspecified site of unspecified female breast: Secondary | ICD-10-CM

## 2011-12-29 LAB — CBC WITH DIFFERENTIAL/PLATELET
BASO%: 1.1 % (ref 0.0–2.0)
Basophils Absolute: 0.1 10*3/uL (ref 0.0–0.1)
EOS%: 1.7 % (ref 0.0–7.0)
Eosinophils Absolute: 0.1 10*3/uL (ref 0.0–0.5)
HCT: 40.2 % (ref 34.8–46.6)
HGB: 13.4 g/dL (ref 11.6–15.9)
LYMPH%: 16.6 % (ref 14.0–49.7)
MCH: 31.1 pg (ref 25.1–34.0)
MCHC: 33.4 g/dL (ref 31.5–36.0)
MCV: 93.1 fL (ref 79.5–101.0)
MONO#: 0.5 10*3/uL (ref 0.1–0.9)
MONO%: 7.1 % (ref 0.0–14.0)
NEUT#: 5.2 10*3/uL (ref 1.5–6.5)
NEUT%: 73.5 % (ref 38.4–76.8)
Platelets: 227 10*3/uL (ref 145–400)
RBC: 4.32 10*6/uL (ref 3.70–5.45)
RDW: 12.8 % (ref 11.2–14.5)
WBC: 7.1 10*3/uL (ref 3.9–10.3)
lymph#: 1.2 10*3/uL (ref 0.9–3.3)

## 2011-12-29 LAB — COMPREHENSIVE METABOLIC PANEL
ALT: 20 U/L (ref 0–35)
AST: 29 U/L (ref 0–37)
Albumin: 4 g/dL (ref 3.5–5.2)
Alkaline Phosphatase: 52 U/L (ref 39–117)
BUN: 19 mg/dL (ref 6–23)
CO2: 31 mEq/L (ref 19–32)
Calcium: 9.1 mg/dL (ref 8.4–10.5)
Chloride: 104 mEq/L (ref 96–112)
Creatinine, Ser: 0.82 mg/dL (ref 0.50–1.10)
Glucose, Bld: 91 mg/dL (ref 70–99)
Potassium: 4.4 mEq/L (ref 3.5–5.3)
Sodium: 140 mEq/L (ref 135–145)
Total Bilirubin: 0.4 mg/dL (ref 0.3–1.2)
Total Protein: 6.3 g/dL (ref 6.0–8.3)

## 2011-12-29 LAB — CANCER ANTIGEN 27.29: CA 27.29: 8 U/mL (ref 0–39)

## 2011-12-29 LAB — LACTATE DEHYDROGENASE: LDH: 162 U/L (ref 94–250)

## 2011-12-30 LAB — VITAMIN D 25 HYDROXY (VIT D DEFICIENCY, FRACTURES): Vit D, 25-Hydroxy: 52 ng/mL (ref 30–89)

## 2011-12-31 ENCOUNTER — Telehealth: Payer: Self-pay | Admitting: *Deleted

## 2011-12-31 NOTE — Telephone Encounter (Signed)
Patient confirmed over the phone new date and time 09-26-013 starting at 3:30pm

## 2012-01-05 ENCOUNTER — Ambulatory Visit: Payer: BC Managed Care – PPO | Admitting: Family

## 2012-01-06 ENCOUNTER — Telehealth: Payer: Self-pay | Admitting: *Deleted

## 2012-01-06 NOTE — Telephone Encounter (Signed)
moved patient appointment from 02-11-2012 to 02-15-2012 

## 2012-02-11 ENCOUNTER — Ambulatory Visit: Payer: BC Managed Care – PPO | Admitting: Oncology

## 2012-02-11 ENCOUNTER — Other Ambulatory Visit: Payer: BC Managed Care – PPO | Admitting: Lab

## 2012-02-15 ENCOUNTER — Ambulatory Visit (HOSPITAL_BASED_OUTPATIENT_CLINIC_OR_DEPARTMENT_OTHER): Payer: BC Managed Care – PPO | Admitting: Oncology

## 2012-02-15 ENCOUNTER — Other Ambulatory Visit: Payer: BC Managed Care – PPO | Admitting: Lab

## 2012-02-15 VITALS — BP 126/78 | HR 59 | Temp 98.6°F | Resp 20 | Ht 62.0 in | Wt 144.6 lb

## 2012-02-15 DIAGNOSIS — C50919 Malignant neoplasm of unspecified site of unspecified female breast: Secondary | ICD-10-CM

## 2012-02-15 DIAGNOSIS — C50419 Malignant neoplasm of upper-outer quadrant of unspecified female breast: Secondary | ICD-10-CM

## 2012-02-15 DIAGNOSIS — N644 Mastodynia: Secondary | ICD-10-CM

## 2012-02-15 DIAGNOSIS — Z170 Estrogen receptor positive status [ER+]: Secondary | ICD-10-CM

## 2012-02-15 NOTE — Progress Notes (Signed)
Hematology and Oncology Follow Up Visit  Anna Navarro [id number] Oct 14, 1955 56 y.o. 02/15/2012    HPI: Anna Navarro is a 56 year old [place] woman with a history of a T1b, N0, ER/PR positive right breast carcinoma for which she underwent a right lumpectomy with sentinel node dissection in April of 2009. She completed radiation therapy, and was placed on Femara 2.5 mg by mouth daily as of 11/24/2007.  Interim History:   Anna Navarro presents for office today for her 6 month followup dictating for a history of a stage I, ER/PR positive right breast carcinoma. She is feeling well, denying any unexplained fevers chills night sweats shortness of breath or chest pain though she has persistent burning and stinging pain in her right breast which has been chronic in nature.she denies any nausea, emesis, diarrhea or constipation issues. She will be due for her annual mammogram and biannual breast MRI in March 2013. Excluding the pain within the right breast, she denies any palpable masses. No nipple inversion or discharge.  A detailed review of systems is otherwise noncontributory as noted below.  Review of Systems: Constitutional:  no weight loss, fever, night sweats and feels well Eyes: no complaints ENT: no complaints Cardiovascular: no chest pain or dyspnea on exertion Respiratory: no cough, shortness of breath, or wheezing Neurological: no TIA or stroke symptoms Dermatological: negative Gastrointestinal: no abdominal pain, change in bowel habits, or black or bloody stools Genito-Urinary: no dysuria, trouble voiding, or hematuria Hematological and Lymphatic: negative Breast: persistant R breast pain over lumpectomy site. Musculoskeletal: negative Remaining ROS negative.   Medications:   I have reviewed the patient's current medications.  Current Outpatient Prescriptions  Medication Sig Dispense Refill  . alendronate (FOSAMAX) 70 MG tablet TAKE 1 TABLET BY MOUTH ONCE WEEKLY  12  tablet  1  . Cholecalciferol (VITAMIN D3) 5000 UNITS TABS Take 1 capsule by mouth daily.  30 tablet    . Cyanocobalamin (VITAMIN B 12) 100 MCG LOZG Take by mouth daily.        Marland Kitchen gabapentin (NEURONTIN) 100 MG capsule       . letrozole (FEMARA) 2.5 MG tablet Take 2.5 mg by mouth daily. As directed       . nitrofurantoin (MACRODANTIN) 100 MG capsule Take 100 mg by mouth 3 (three) times a week.       Marland Kitchen omeprazole (PRILOSEC) 20 MG capsule Take 1 capsule (20 mg total) by mouth daily.  30 capsule  11  . DISCONTD: gabapentin (NEURONTIN) 100 MG capsule TAKE ONE CAPSULE BY MOUTH THREE TIMES DAILY  90 capsule  4  . DISCONTD: gabapentin (NEURONTIN) 100 MG capsule Take 100 mg by mouth 3 (three) times daily.        Allergies: No Known Allergies  Physical Exam: Filed Vitals:   02/15/12 1613  BP: 126/78  Pulse: 59  Temp: 98.6 F (37 C)  Resp: 20  Weight: 143 lbs. HEENT:  Sclerae anicteric, conjunctivae pink.  Oropharynx clear.  No mucositis or candidiasis.   Nodes:  No cervical, supraclavicular, or axillary lymphadenopathy palpated.  Breast Exam:  The breast was examined, the right lumpectomy scar is benign without any dominant mass effect or nipple inversion or discharge. There is no evidence of diffuse erythema the breast itself is quite tender especially of the lumpectomy site. The axilla is clear. The left breast is free of masses skin changes nipple inversion or discharge axilla clear.  Lungs:  Clear to auscultation bilaterally.  No crackles, rhonchi, or wheezes.  Heart:  Regular rate and rhythm.   Abdomen:  Soft, nontender.  Positive bowel sounds.  No organomegaly or masses palpated.   Musculoskeletal:  No focal spinal tenderness to palpation.  Extremities:  Benign.  No peripheral edema or cyanosis.   Skin:  Benign.   Neuro:  Nonfocal.   Lab Results: Lab Results  Component Value Date   WBC 7.1 12/29/2011   HGB 13.4 12/29/2011   HCT 40.2 12/29/2011   MCV 93.1 12/29/2011   PLT 227 12/29/2011    NEUTROABS 5.2 12/29/2011     Chemistry      Component Value Date/Time   NA 140 12/29/2011 1052   K 4.4 12/29/2011 1052   CL 104 12/29/2011 1052   CO2 31 12/29/2011 1052   BUN 19 12/29/2011 1052   CREATININE 0.82 12/29/2011 1052      Component Value Date/Time   CALCIUM 9.1 12/29/2011 1052   ALKPHOS 52 12/29/2011 1052   AST 29 12/29/2011 1052   ALT 20 12/29/2011 1052   BILITOT 0.4 12/29/2011 1052        Radiological Studies: 07/31/10 DIGITAL DIAGNOSTIC BILATERAL MAMMOGRAM with CAD  Comparison: 07/24/2009 and prior  Findings: MLO and CC views of both breasts and a spot tangential  view of the lumpectomy site performed. Scarring in the right breast  again identified. There is no evidence of mass, nonsurgical  distortion, or suspicious calcifications.  Scattered fibroglandular densities bilaterally are again noted.  Mammographic images were processed with CAD.  IMPRESSION:  No specific mammographic evidence of malignancy.  Right breast scarring.  These findings were discussed with the patient. She was encouraged  to begin/continue monthly self exams and to contact her primary  physician if any changes noted.  BI-RADS CATEGORY 2: Benign finding(s).  Recommend bilateral diagnostic mammograms in 1 year.  Original Report Authenticated By: Rosendo Gros, M.D.         Assessment:  Anna Navarro is a 56 year old [place] woman with a history of a T1b, N0, ER/PR positive right breast carcinoma for which she underwent a right lumpectomy with sentinel node dissection in April of 2009. She completed radiation therapy, and was placed on Femara 2.5 mg by mouth daily as of 11/24/2007. No evidence of recurrent or metastatic disease. 2. Persistant R breast pain.  Case reviewed with Dr. Pierce Crane who also spoke with patient.  Plan:  Anna Navarro will continue on Femara 2.5 mg by mouth daily. She is willing to try gabapentin 100 mg by mouth 3 times a day, therefore prescription was given.  She will be scheduled for her annual diagnostic mammogram and [facility].we will plan on seeing her back in 6 months for routine oncologic followup which will include a CBC serum chemistries LDH CA 27-29 and vitamin D level.  She knows to contact us in the interim if he should arise.   This plan was reviewed with the patient, who voices understanding and agreement.  She knows to call with any changes or problems.    Pierce Crane, PA-C 02/15/2012

## 2012-02-15 NOTE — Progress Notes (Signed)
Hematology and Oncology Follow Up Visit  Anna Navarro [id number] Oct 14, 1955 56 y.o. 02/15/2012    HPI: Anna Navarro is a 56 year old [place] woman with a history of a T1b, N0, ER/PR positive right breast carcinoma for which she underwent a right lumpectomy with sentinel node dissection in April of 2009. She completed radiation therapy, and was placed on Femara 2.5 mg by mouth daily as of 11/24/2007.  Interim History:   Anna Navarro presents for office today for her 6 month followup dictating for a history of a stage I, ER/PR positive right breast carcinoma. She is feeling well, denying any unexplained fevers chills night sweats shortness of breath or chest pain though she has persistent burning and stinging pain in her right breast which has been chronic in nature.she denies any nausea, emesis, diarrhea or constipation issues. She will be due for her annual mammogram and biannual breast MRI in March 2013. Excluding the pain within the right breast, she denies any palpable masses. No nipple inversion or discharge.  A detailed review of systems is otherwise noncontributory as noted below.  Review of Systems: Constitutional:  no weight loss, fever, night sweats and feels well Eyes: no complaints ENT: no complaints Cardiovascular: no chest pain or dyspnea on exertion Respiratory: no cough, shortness of breath, or wheezing Neurological: no TIA or stroke symptoms Dermatological: negative Gastrointestinal: no abdominal pain, change in bowel habits, or black or bloody stools Genito-Urinary: no dysuria, trouble voiding, or hematuria Hematological and Lymphatic: negative Breast: persistant R breast pain over lumpectomy site. Musculoskeletal: negative Remaining ROS negative.   Medications:   I have reviewed the patient's current medications.  Current Outpatient Prescriptions  Medication Sig Dispense Refill  . alendronate (FOSAMAX) 70 MG tablet TAKE 1 TABLET BY MOUTH ONCE WEEKLY  12  tablet  1  . Cholecalciferol (VITAMIN D3) 5000 UNITS TABS Take 1 capsule by mouth daily.  30 tablet    . Cyanocobalamin (VITAMIN B 12) 100 MCG LOZG Take by mouth daily.        Marland Kitchen gabapentin (NEURONTIN) 100 MG capsule       . letrozole (FEMARA) 2.5 MG tablet Take 2.5 mg by mouth daily. As directed       . nitrofurantoin (MACRODANTIN) 100 MG capsule Take 100 mg by mouth 3 (three) times a week.       Marland Kitchen omeprazole (PRILOSEC) 20 MG capsule Take 1 capsule (20 mg total) by mouth daily.  30 capsule  11  . DISCONTD: gabapentin (NEURONTIN) 100 MG capsule TAKE ONE CAPSULE BY MOUTH THREE TIMES DAILY  90 capsule  4  . DISCONTD: gabapentin (NEURONTIN) 100 MG capsule Take 100 mg by mouth 3 (three) times daily.        Allergies: No Known Allergies  Physical Exam: Filed Vitals:   02/15/12 1613  BP: 126/78  Pulse: 59  Temp: 98.6 F (37 C)  Resp: 20  Weight: 143 lbs. HEENT:  Sclerae anicteric, conjunctivae pink.  Oropharynx clear.  No mucositis or candidiasis.   Nodes:  No cervical, supraclavicular, or axillary lymphadenopathy palpated.  Breast Exam:  The breast was examined, the right lumpectomy scar is benign without any dominant mass effect or nipple inversion or discharge. There is no evidence of diffuse erythema the breast itself is quite tender especially of the lumpectomy site. The axilla is clear. The left breast is free of masses skin changes nipple inversion or discharge axilla clear.  Lungs:  Clear to auscultation bilaterally.  No crackles, rhonchi, or wheezes.  Heart:  Regular rate and rhythm.   Abdomen:  Soft, nontender.  Positive bowel sounds.  No organomegaly or masses palpated.   Musculoskeletal:  No focal spinal tenderness to palpation.  Extremities:  Benign.  No peripheral edema or cyanosis.   Skin:  Benign.   Neuro:  Nonfocal.   Lab Results: Lab Results  Component Value Date   WBC 7.1 12/29/2011   HGB 13.4 12/29/2011   HCT 40.2 12/29/2011   MCV 93.1 12/29/2011   PLT 227 12/29/2011    NEUTROABS 5.2 12/29/2011     Chemistry      Component Value Date/Time   NA 140 12/29/2011 1052   K 4.4 12/29/2011 1052   CL 104 12/29/2011 1052   CO2 31 12/29/2011 1052   BUN 19 12/29/2011 1052   CREATININE 0.82 12/29/2011 1052      Component Value Date/Time   CALCIUM 9.1 12/29/2011 1052   ALKPHOS 52 12/29/2011 1052   AST 29 12/29/2011 1052   ALT 20 12/29/2011 1052   BILITOT 0.4 12/29/2011 1052        Radiological Studies: 07/31/10 DIGITAL DIAGNOSTIC BILATERAL MAMMOGRAM with CAD  Comparison: 07/24/2009 and prior  Findings: MLO and CC views of both breasts and a spot tangential  view of the lumpectomy site performed. Scarring in the right breast  again identified. There is no evidence of mass, nonsurgical  distortion, or suspicious calcifications.  Scattered fibroglandular densities bilaterally are again noted.  Mammographic images were processed with CAD.  IMPRESSION:  No specific mammographic evidence of malignancy.  Right breast scarring.  These findings were discussed with the patient. She was encouraged  to begin/continue monthly self exams and to contact her primary  physician if any changes noted.  BI-RADS CATEGORY 2: Benign finding(s).  Recommend bilateral diagnostic mammograms in 1 year.  Original Report Authenticated By: Rosendo Gros, M.D.         Assessment:  Anna Navarro is a 56 year old [place] woman with a history of a T1b, N0, ER/PR positive right breast carcinoma for which she underwent a right lumpectomy with sentinel node dissection in April of 2009. She completed radiation therapy, and was placed on Femara 2.5 mg by mouth daily as of 11/24/2007. No evidence of recurrent or metastatic disease. 2. Persistant R breast pain.    Plan:  Anna Navarro will continue on Femara 2.5 mg by mouth daily. She is willing to try gabapentin 100 mg by mouth 3 times a day, therefore prescription was given. She will be scheduled for her annual diagnostic mammogram and  [facility].we will plan on seeing her back in 6 months for routine oncologic followup which will include a CBC serum chemistries LDH CA 27-29 and vitamin D level.  She knows to contact us in the interim if he should arise.      RUBIN,PETER, md 02/15/2012

## 2012-02-16 ENCOUNTER — Telehealth: Payer: Self-pay | Admitting: *Deleted

## 2012-02-16 ENCOUNTER — Ambulatory Visit: Payer: BC Managed Care – PPO | Admitting: Family

## 2012-02-16 NOTE — Telephone Encounter (Signed)
Made patient appointment for 08-10-2012 lab only  Made patient appointment for 08-15-2012 mammogram at [facility]  Made patient appointment for 08-18-2012 md apppointment

## 2012-02-17 ENCOUNTER — Encounter: Payer: Self-pay | Admitting: Internal Medicine

## 2012-02-17 ENCOUNTER — Ambulatory Visit (INDEPENDENT_AMBULATORY_CARE_PROVIDER_SITE_OTHER): Payer: BC Managed Care – PPO | Admitting: Internal Medicine

## 2012-02-17 VITALS — BP 124/78 | HR 72 | Temp 98.6°F | Resp 16 | Ht 62.0 in | Wt 144.0 lb

## 2012-02-17 DIAGNOSIS — R55 Syncope and collapse: Secondary | ICD-10-CM

## 2012-02-17 DIAGNOSIS — F32A Depression, unspecified: Secondary | ICD-10-CM

## 2012-02-17 DIAGNOSIS — H6691 Otitis media, unspecified, right ear: Secondary | ICD-10-CM

## 2012-02-17 DIAGNOSIS — H6690 Otitis media, unspecified, unspecified ear: Secondary | ICD-10-CM

## 2012-02-17 DIAGNOSIS — F3289 Other specified depressive episodes: Secondary | ICD-10-CM

## 2012-02-17 DIAGNOSIS — F329 Major depressive disorder, single episode, unspecified: Secondary | ICD-10-CM

## 2012-02-17 MED ORDER — DESVENLAFAXINE SUCCINATE ER 50 MG PO TB24: 50.0000 mg | ORAL_TABLET | Freq: Every day | ORAL | Status: DC

## 2012-02-17 MED ORDER — CIPROFLOXACIN-HYDROCORTISONE 0.2-1 % OT SUSP: 3.0000 [drp] | Freq: Two times a day (BID) | OTIC | Status: DC

## 2012-02-17 NOTE — Progress Notes (Signed)
Subjective:    Patient ID: Anna Navarro, female    DOB: 15-Dec-1955, 56 y.o.   MRN: [id number]  HPI   patient presents with a report of some syncopal episodes.  She states it feels like a wave overcoming her and she feels dizzy. Is not always exertional but can happen more towards the end of the day than the beginning of the day and not associated with chest pain nausea diaphoresis or any other symptoms she cannot relate them to meals or food.  She is status post bariatric surgery. She had a CBC and a competent medical panel done at the end of August by her oncologist she has not noticed any dark or tarry stools she had emesis for hematochezia.  The patient does have some mild fullness in her right ear. She designated she is extremely anxious and depressed over her father's condition affected she said return to work and is straining caring for her elderly father and working in her family  Review of Systems  Constitutional: Negative for activity change, appetite change and fatigue.  HENT: Negative for ear pain, congestion, neck pain, postnasal drip and sinus pressure.   Eyes: Negative for redness and visual disturbance.  Respiratory: Negative for cough, shortness of breath and wheezing.   Gastrointestinal: Negative for abdominal pain and abdominal distention.  Genitourinary: Negative for dysuria, frequency and menstrual problem.  Musculoskeletal: Negative for myalgias, joint swelling and arthralgias.  Skin: Negative for rash and wound.  Neurological: Positive for dizziness, weakness, light-headedness and headaches.  Hematological: Negative for adenopathy. Does not bruise/bleed easily.  Psychiatric/Behavioral: Negative for disturbed wake/sleep cycle and decreased concentration.   Past Medical History  Diagnosis Date  . ALLERGIC RHINITIS 02/03/2007  . 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 DEPRESSION 11/12/2006  . EUSTACHIAN TUBE DYSFUNCTION, CHRONIC  05/23/2010  . GERD 11/12/2006  . HYPERLIPIDEMIA 11/12/2006  . 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 LOW BACK PAIN 02/03/2007  . NECK MASS 01/03/2007  . WARTS, HAND 01/07/2010  . WEIGHT LOSS, RECENT 02/03/2007    History   Social History  . Marital Status: Married    Spouse Name: N/A    Number of Children: N/A  . Years of Education: N/A   Occupational History  . Not on file.   Social History Main Topics  . Smoking status: Never Smoker   . Smokeless tobacco: Not on file  . Alcohol Use: No  . Drug Use:   . Sexually Active:    Other Topics Concern  . Not on file   Social History Narrative  . No narrative on file    Past Surgical History  Procedure Date  . Gastric bypass   . Total abdominal hysterectomy     No family history on file.  No Known Allergies  Current Outpatient Prescriptions on File Prior to Visit  Medication Sig Dispense Refill  . alendronate (FOSAMAX) 70 MG tablet TAKE 1 TABLET BY MOUTH ONCE WEEKLY  12 tablet  1  . Cholecalciferol (VITAMIN D3) 5000 UNITS TABS Take 1 capsule by mouth daily.  30 tablet    . desvenlafaxine (PRISTIQ) 50 MG 24 hr tablet Take 1 tablet (50 mg total) by mouth daily.  30 tablet  3  . gabapentin (NEURONTIN) 100 MG capsule       . letrozole (FEMARA) 2.5 MG tablet Take 2.5 mg by mouth daily. As directed       . nitrofurantoin (MACRODANTIN) 100 MG capsule Take  100 mg by mouth 3 (three) times a week.       Marland Kitchen omeprazole (PRILOSEC) 20 MG capsule Take 1 capsule (20 mg total) by mouth daily.  30 capsule  11    BP 124/78  Pulse 72  Temp 98.6 F (37 C)  Resp 16  Ht 5\' 2"  (1.575 m)  Wt 144 lb (65.318 kg)  BMI 26.34 kg/m2       Objective:   Physical Exam  Constitutional: She is oriented to person, place, and time. She appears well-developed and well-nourished. No distress.       Blood pressure is not orthostatic but pulses orthostatics going from a resting pulse of 67 standing pulse of 90  HENT:  Head:  Normocephalic and atraumatic.  Right Ear: External ear normal.  Left Ear: External ear normal.  Nose: Nose normal.  Mouth/Throat: Oropharynx is clear and moist.  Eyes: Conjunctivae normal and EOM are normal. Pupils are equal, round, and reactive to light.  Neck: Normal range of motion. Neck supple. No JVD present. No tracheal deviation present. No thyromegaly present.  Cardiovascular: Normal rate, regular rhythm and intact distal pulses.   Murmur heard. Pulmonary/Chest: Effort normal and breath sounds normal. She has no wheezes. She exhibits no tenderness.  Abdominal: Soft. Bowel sounds are normal.  Musculoskeletal: Normal range of motion. She exhibits no edema and no tenderness.  Lymphadenopathy:    She has no cervical adenopathy.  Neurological: She is alert and oriented to person, place, and time. She has normal reflexes. No cranial nerve deficit.  Skin: Skin is warm and dry. She is not diaphoretic.  Psychiatric: She has a normal mood and affect. Her behavior is normal.          Assessment & Plan:  Acute otitis externa in the right ear we'll treat with ciprofloxacin HC drops in the ear twice daily for 7 days.  Syncopal and presyncopal episodes with orthostasis will obtain an echocardiogram have talked to her about her volume status including making sure that she drinks plenty of fluids.  Acute depression.  Begin a trial of pristiq 50 mg one by mouth daily followup in 6-8 weeks

## 2012-02-19 ENCOUNTER — Other Ambulatory Visit: Payer: Self-pay | Admitting: *Deleted

## 2012-02-19 DIAGNOSIS — C50419 Malignant neoplasm of upper-outer quadrant of unspecified female breast: Secondary | ICD-10-CM

## 2012-02-19 DIAGNOSIS — C50919 Malignant neoplasm of unspecified site of unspecified female breast: Secondary | ICD-10-CM

## 2012-02-19 MED ORDER — ALENDRONATE SODIUM 70 MG PO TABS: 70.0000 mg | ORAL_TABLET | ORAL | Status: DC

## 2012-02-23 ENCOUNTER — Ambulatory Visit (HOSPITAL_COMMUNITY): Payer: BC Managed Care – PPO | Attending: Cardiology | Admitting: Radiology

## 2012-02-23 DIAGNOSIS — I059 Rheumatic mitral valve disease, unspecified: Secondary | ICD-10-CM | POA: Insufficient documentation

## 2012-02-23 DIAGNOSIS — R55 Syncope and collapse: Secondary | ICD-10-CM | POA: Insufficient documentation

## 2012-02-23 DIAGNOSIS — I369 Nonrheumatic tricuspid valve disorder, unspecified: Secondary | ICD-10-CM | POA: Insufficient documentation

## 2012-02-23 NOTE — Progress Notes (Signed)
Echocardiogram performed.  

## 2012-02-25 ENCOUNTER — Telehealth: Payer: Self-pay | Admitting: Internal Medicine

## 2012-02-26 ENCOUNTER — Telehealth: Payer: Self-pay | Admitting: *Deleted

## 2012-02-26 NOTE — Telephone Encounter (Signed)
Opened in error

## 2012-03-03 ENCOUNTER — Other Ambulatory Visit: Payer: Self-pay | Admitting: Oncology

## 2012-03-03 DIAGNOSIS — C50919 Malignant neoplasm of unspecified site of unspecified female breast: Secondary | ICD-10-CM

## 2012-04-11 ENCOUNTER — Other Ambulatory Visit: Payer: Self-pay | Admitting: *Deleted

## 2012-04-11 DIAGNOSIS — F32A Depression, unspecified: Secondary | ICD-10-CM

## 2012-04-11 DIAGNOSIS — F329 Major depressive disorder, single episode, unspecified: Secondary | ICD-10-CM

## 2012-04-11 MED ORDER — DESVENLAFAXINE SUCCINATE ER 50 MG PO TB24: 50.0000 mg | ORAL_TABLET | Freq: Every day | ORAL | Status: DC

## 2012-04-19 ENCOUNTER — Telehealth: Payer: Self-pay | Admitting: Family Medicine

## 2012-04-19 ENCOUNTER — Other Ambulatory Visit: Payer: Self-pay | Admitting: Internal Medicine

## 2012-04-19 NOTE — Telephone Encounter (Signed)
Bonnye, this pt's insurance will not cover the PRISTIQ unless she has tried and failed TWO of the following: Cymbalta Effexor (immediate or extended release) - generic welbutrin SR or XL- generic Celexa- generic Prozac- generic Paxil- generic Zoloft - generic  No other meds are in EPIC or EMR. If she has never tried anything else, a new rx for one of the above will need to be sent in, or she can pay cash for the Pristiq. Please advise.

## 2012-04-19 NOTE — Telephone Encounter (Signed)
Anna Navarro - I just spoke w/ [name] again. She tried Wellbutrin in 2007 - didn't work. She was given Pristiq samples by Dr. Lovell Sheehan when here last. She ran out, and hasn't taken it in 1 wk. I told pt once Dr. Lovell Sheehan decided what to do, we'd call her. Also, she didn't know if she should get more Pristiq samples since she ran out, so she's taking something, or just not take anything until Dr. Lovell Sheehan decides on a med.

## 2012-04-20 NOTE — Progress Notes (Signed)
Another md ordered prozac several years ago - she took for 6 months and was ineffective- will that plus the wellbutrin work?

## 2012-04-25 ENCOUNTER — Ambulatory Visit: Payer: BC Managed Care – PPO | Admitting: Internal Medicine

## 2012-04-25 ENCOUNTER — Other Ambulatory Visit: Payer: Self-pay | Admitting: *Deleted

## 2012-04-25 ENCOUNTER — Telehealth: Payer: Self-pay | Admitting: Internal Medicine

## 2012-04-25 ENCOUNTER — Telehealth: Payer: Self-pay | Admitting: *Deleted

## 2012-04-25 ENCOUNTER — Other Ambulatory Visit (INDEPENDENT_AMBULATORY_CARE_PROVIDER_SITE_OTHER): Payer: BC Managed Care – PPO

## 2012-04-25 DIAGNOSIS — R42 Dizziness and giddiness: Secondary | ICD-10-CM

## 2012-04-25 LAB — CBC WITH DIFFERENTIAL/PLATELET
Basophils Absolute: 0.1 10*3/uL (ref 0.0–0.1)
Basophils Relative: 1 % (ref 0.0–3.0)
Eosinophils Absolute: 0.1 10*3/uL (ref 0.0–0.7)
Eosinophils Relative: 1.2 % (ref 0.0–5.0)
HCT: 42.5 % (ref 36.0–46.0)
Hemoglobin: 14 g/dL (ref 12.0–15.0)
Lymphocytes Relative: 31.1 % (ref 12.0–46.0)
Lymphs Abs: 2.2 10*3/uL (ref 0.7–4.0)
MCHC: 32.9 g/dL (ref 30.0–36.0)
MCV: 92.4 fl (ref 78.0–100.0)
Monocytes Absolute: 0.5 10*3/uL (ref 0.1–1.0)
Monocytes Relative: 7.5 % (ref 3.0–12.0)
Neutro Abs: 4.1 10*3/uL (ref 1.4–7.7)
Neutrophils Relative %: 59.2 % (ref 43.0–77.0)
Platelets: 243 10*3/uL (ref 150.0–400.0)
RBC: 4.6 Mil/uL (ref 3.87–5.11)
RDW: 12.7 % (ref 11.5–14.6)
WBC: 7 10*3/uL (ref 4.5–10.5)

## 2012-04-25 MED ORDER — CIPROFLOXACIN-HYDROCORTISONE 0.2-1 % OT SUSP: 3.0000 [drp] | Freq: Two times a day (BID) | OTIC | Status: DC

## 2012-04-25 NOTE — Telephone Encounter (Signed)
Get cbc and refill ear drop= Left message on machine for pt

## 2012-04-25 NOTE — Telephone Encounter (Signed)
Patient confirmed over the phone the new time but same date 08-18-2012

## 2012-04-25 NOTE — Telephone Encounter (Signed)
Pt rescehd to 2/10. States having dizziness off and on again. Wondering if it's her ears. Should she resume the drops? Please call and advise.

## 2012-05-03 ENCOUNTER — Encounter: Payer: Self-pay | Admitting: *Deleted

## 2012-05-03 NOTE — Progress Notes (Signed)
Faxed referral for lymphedema clinic.

## 2012-05-09 ENCOUNTER — Ambulatory Visit: Payer: BC Managed Care – PPO | Attending: Oncology

## 2012-05-09 DIAGNOSIS — IMO0001 Reserved for inherently not codable concepts without codable children: Secondary | ICD-10-CM | POA: Insufficient documentation

## 2012-05-09 DIAGNOSIS — I890 Lymphedema, not elsewhere classified: Secondary | ICD-10-CM | POA: Insufficient documentation

## 2012-05-09 DIAGNOSIS — M25619 Stiffness of unspecified shoulder, not elsewhere classified: Secondary | ICD-10-CM | POA: Insufficient documentation

## 2012-05-25 ENCOUNTER — Ambulatory Visit: Payer: BC Managed Care – PPO | Attending: Oncology

## 2012-05-25 DIAGNOSIS — I890 Lymphedema, not elsewhere classified: Secondary | ICD-10-CM | POA: Insufficient documentation

## 2012-05-25 DIAGNOSIS — IMO0001 Reserved for inherently not codable concepts without codable children: Secondary | ICD-10-CM | POA: Insufficient documentation

## 2012-05-25 DIAGNOSIS — M25619 Stiffness of unspecified shoulder, not elsewhere classified: Secondary | ICD-10-CM | POA: Insufficient documentation

## 2012-05-30 ENCOUNTER — Ambulatory Visit: Payer: BC Managed Care – PPO | Admitting: Physical Therapy

## 2012-06-02 ENCOUNTER — Ambulatory Visit: Payer: BC Managed Care – PPO | Admitting: *Deleted

## 2012-06-13 ENCOUNTER — Encounter: Payer: BC Managed Care – PPO | Admitting: Physical Therapy

## 2012-06-14 ENCOUNTER — Encounter: Payer: Self-pay | Admitting: Oncology

## 2012-06-14 ENCOUNTER — Telehealth: Payer: Self-pay | Admitting: *Deleted

## 2012-06-14 NOTE — Telephone Encounter (Signed)
Pt called about her appt and I confirmed 08/18/12 appt w/ pt.  Mailed letter and calendar to pt.  Pt request Dr. Darnelle Catalan.

## 2012-06-16 ENCOUNTER — Ambulatory Visit: Payer: BC Managed Care – PPO | Admitting: *Deleted

## 2012-06-20 ENCOUNTER — Encounter: Payer: BC Managed Care – PPO | Admitting: Physical Therapy

## 2012-06-22 ENCOUNTER — Encounter: Payer: BC Managed Care – PPO | Admitting: Physical Therapy

## 2012-06-23 ENCOUNTER — Encounter: Payer: BC Managed Care – PPO | Admitting: Physical Therapy

## 2012-06-27 ENCOUNTER — Ambulatory Visit: Payer: BC Managed Care – PPO | Admitting: Internal Medicine

## 2012-06-30 ENCOUNTER — Encounter: Payer: BC Managed Care – PPO | Admitting: Physical Therapy

## 2012-08-10 ENCOUNTER — Other Ambulatory Visit: Payer: BC Managed Care – PPO | Admitting: Lab

## 2012-08-15 ENCOUNTER — Ambulatory Visit
Admission: RE | Admit: 2012-08-15 | Discharge: 2012-08-15 | Disposition: A | Discharge status: Home / Self Care | Payer: BC Managed Care – PPO | Source: Ambulatory Visit | Attending: Oncology | Admitting: Oncology

## 2012-08-15 DIAGNOSIS — C50919 Malignant neoplasm of unspecified site of unspecified female breast: Secondary | ICD-10-CM

## 2012-08-18 ENCOUNTER — Ambulatory Visit (HOSPITAL_BASED_OUTPATIENT_CLINIC_OR_DEPARTMENT_OTHER): Payer: BC Managed Care – PPO | Admitting: Gynecologic Oncology

## 2012-08-18 ENCOUNTER — Ambulatory Visit: Payer: BC Managed Care – PPO | Admitting: Oncology

## 2012-08-18 ENCOUNTER — Encounter: Payer: Self-pay | Admitting: Gynecologic Oncology

## 2012-08-18 VITALS — BP 134/85 | HR 66 | Temp 97.4°F | Resp 20 | Ht 62.0 in | Wt 150.2 lb

## 2012-08-18 DIAGNOSIS — Z853 Personal history of malignant neoplasm of breast: Secondary | ICD-10-CM

## 2012-08-18 DIAGNOSIS — Z170 Estrogen receptor positive status [ER+]: Secondary | ICD-10-CM

## 2012-08-18 DIAGNOSIS — C50419 Malignant neoplasm of upper-outer quadrant of unspecified female breast: Secondary | ICD-10-CM

## 2012-08-18 NOTE — Progress Notes (Signed)
ID: Solon Augusta   DOB: [birth year]  MR#: [id number]  CSN#:[id number]  PCP: Carrie Mew, MD GYN:  Provider at Dr. Harlene Salts office SU: Dr, Colin Benton OTHER MD:  Dermatologist   HISTORY OF PRESENT ILLNESS:  Anna Navarro is a 57 year old [place] woman, who had undergone annual screening mammography in September 2008, which resulted a questionable area of calcification seen in the right breast and followup was recommended.  Bilateral diagnostic mammogram performed on 07/22/2007 showed an indeterminate 5 mm cluster of calcifications, upper outer quadrant of right breast.  Biopsy was recommended.  Biopsy on 08/04/2007 showed rare atypical glands associated with microcalcifications.  Surgical excision was recommended.  A preoperative MRI scan was performed on 08/11/2007.  This showed a 1.4 x 0.9 x 0.7 cm area of mass-like oval enhancement and central clip artifact, right breast upper outer quadrant.  The left breast was negative.  There was a left hepatic lobe nonenhancing T2 hyperintense lesion compatible with a cyst.  She underwent a lumpectomy and sentinel lymph node evaluation on 08/17/2007 with final pathology resulting a 0.7 cm focus of invasive ductal cancer, grade 1 of 3.  Margins are negative for tumor and lymphovascular invasion was negative.  There were tubular features noted associated with this lesion and it was ER and PR positive at 100% and 5% respectively, proliferative index was 10%, and HER-2 was 0.  She underwent radiation therapy under the care of Dr. Mitzi Hansen from 10/05/07 to 11/23/07.  She then started Femara in July of 2009 and has tolerated it well since.  INTERVAL HISTORY:  She presents today for continued follow up.  She reports tolerating Femara well, with no hot flashes or joint aches.  She reports tolerable vaginal dryness.  She reports issues with pain in her right arm related to lymphedema.  She reports improvement when wearing her sleeve and she feels that [facility].  No other concerns voiced.  REVIEW OF SYSTEMS: Constitutional: Feels well.  Cardiovascular: No chest pain, shortness of breath, or edema.  Pulmonary: No cough or wheeze.  Gastrointestinal: No nausea, vomiting, or diarrhea. No bright red blood per rectum or change in bowel movement.  Genitourinary: No frequency, urgency, or dysuria. No vaginal bleeding or discharge.  Musculoskeletal: No myalgia or joint pain. Neurologic: No weakness, numbness, or change in gait.  Psychology: No depression, anxiety, or insomnia  PAST MEDICAL HISTORY: Past Medical History  Diagnosis Date  . ALLERGIC RHINITIS 02/03/2007  . 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 DEPRESSION 11/12/2006  . 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 GERD 11/12/2006  . HYPERLIPIDEMIA 11/12/2006  . 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 LOW BACK PAIN 02/03/2007  . NECK MASS 01/03/2007  . WARTS, HAND 01/07/2010  . WEIGHT LOSS, RECENT 02/03/2007    PAST SURGICAL HISTORY: Past Surgical History  Procedure Laterality Date  . Gastric bypass    . Total abdominal hysterectomy      FAMILY HISTORY Family History  Problem Relation Age of Onset  . Heart attack Father   . Lung cancer Paternal Grandmother   . Colon cancer Paternal Grandmother   Her mother had bilateral mastectomies but the reason is unknown  GYNECOLOGIC HISTORY:  G2 P2 and has three children including one adopted child.  There is no other history of breast or ovarian cancer in the family.  Of note is that the patient and family are  of Ashkenazi [employer].  SOCIAL HISTORY:  She has been married for over 30 years.  Husband is an optometrist.  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  She is originally from [place].     ADVANCED DIRECTIVES:  Living will  HEALTH MAINTENANCE: History  Substance Use Topics  .  Smoking status: Never Smoker   . Smokeless tobacco: Not on file  . Alcohol Use: Yes     Colonoscopy:  2009  PAP: Hysterectomy  Bone density: 11/25/10 resulting osteoporosis  Lipid panel:  Managed by Dr. Lovell Sheehan  No Known Allergies  Current Outpatient Prescriptions  Medication Sig Dispense Refill  . Cholecalciferol (VITAMIN D3) 5000 UNITS TABS Take 1 capsule by mouth daily.  30 tablet    . desvenlafaxine (PRISTIQ) 50 MG 24 hr tablet Take 1 tablet (50 mg total) by mouth daily.  90 tablet  3  . gabapentin (NEURONTIN) 100 MG capsule       . nitrofurantoin (MACRODANTIN) 100 MG capsule Take 100 mg by mouth 3 (three) times a week.       Marland Kitchen alendronate (FOSAMAX) 70 MG tablet Take 1 tablet (70 mg total) by mouth every 7 (seven) days. Take with a full glass of water on an empty stomach.  12 tablet  2  . letrozole (FEMARA) 2.5 MG tablet TAKE 1 TABLET BY MOUTH EVERY DAY  90 tablet  1   No current facility-administered medications for this visit.    OBJECTIVE: Filed Vitals:   08/18/12 1448  BP: 134/85  Pulse: 66  Temp: 97.4 F (36.3 C)  Resp: 20     Body mass index is 27.46 kg/(m^2).    ECOG FS:  Asymptomatic  General: Well developed, well nourished female in no acute distress. Alert and oriented x 3.  Head/ Neck: Oropharynx clear.  Sclerae anicteric.  Supple without any enlargements.  Lymph node survey: No cervical, supraclavicular, or axillary adenopathy  Cardiovascular: Regular rate and rhythm. S1 and S2 normal.  Lungs: Clear to auscultation bilaterally. No wheezes/crackles/rhonchi noted.  Skin: No rashes or lesions present. Back: No CVA tenderness.  Abdomen: Abdomen soft, non-tender and obese. Active bowel sounds in all quadrants. No evidence of a fluid wave or abdominal masses.  Breasts: Inspection negative with no nodularity, masses, erythema, or discharge noted bilaterally.  Right lumpectomy scar benign. Extremities: No bilateral cyanosis, edema, or clubbing.    LAB  RESULTS: Lab Results  Component Value Date   WBC 7.0 04/25/2012   NEUTROABS 4.1 04/25/2012   HGB 14.0 04/25/2012   HCT 42.5 04/25/2012   MCV 92.4 04/25/2012   PLT 243.0 04/25/2012      Chemistry      Component Value Date/Time   NA 140 12/29/2011 1052   K 4.4 12/29/2011 1052   CL 104 12/29/2011 1052   CO2 31 12/29/2011 1052   BUN 19 12/29/2011 1052   CREATININE 0.82 12/29/2011 1052      Component Value Date/Time   CALCIUM 9.1 12/29/2011 1052   ALKPHOS 52 12/29/2011 1052   AST 29 12/29/2011 1052   ALT 20 12/29/2011 1052   BILITOT 0.4 12/29/2011 1052       Lab Results  Component Value Date   LABCA2 8 12/29/2011    No components found with this basename: [id number]    No results found for this basename: INR,  in the last 168 hours  Urinalysis    Component Value Date/Time   COLORURINE yellow 12/06/2009 0859   APPEARANCEUR  Clear 12/06/2009 0859   LABSPEC 1.010 01/24/2010 1541   PHURINE 6.5 01/24/2010 1541   GLUCOSEU NEGATIVE 10/23/2006 1739   HGBUR trace-intact 01/24/2010 1541   HGBUR TRACE* 10/23/2006 1739   BILIRUBINUR negative 01/24/2010 1541   KETONESUR TRACE* 10/23/2006 1739   PROTEINUR NEGATIVE 10/23/2006 1739   UROBILINOGEN 0.2 01/24/2010 1541   NITRITE negative 01/24/2010 1541   LEUKOCYTESUR SMALL  Biochemical Testing Only. Please order routine urinalysis from main lab if confirmatory testing is needed.* 10/23/2006 1739    STUDIES: Mm Digital Diagnostic Bilat  08/15/2012  *RADIOLOGY REPORT*  Clinical Data:  Personal history of right breast cancer status post lumpectomy 2009  DIGITAL DIAGNOSTIC BILATERAL MAMMOGRAM WITH CAD  Comparison: August 03, 2011, July 31, 2010, July 24, 2009  Findings:  ACR Breast Density Category scattered fiber glandular tissue  CC and MLO views of bilateral breasts, spot tangential view of right breast are submitted.  Stable postsurgical changes are identified within the right breast.  No suspicious abnormality is identified bilaterally.  Mammographic images were processed  with CAD.  IMPRESSION: Benign findings.  RECOMMENDATION: Bilateral diagnostic mammogram in 1 year.  I have discussed the findings and recommendations with the patient. Results were also provided in writing at the conclusion of the visit.  If applicable, a reminder letter will be sent to the patient regarding her next appointment.  BI-RADS CATEGORY 2:  Benign finding(s).   Original Report Authenticated By: Sherian Rein, M.D.     ASSESSMENT: 57 y.o. [place] woman: #1 S/P right lumpectomy with sentinel lymph node biopsy on 08/17/07 for a T1b N0 IDC of the right breast, Stage I, ER+, PR+, HER2-.  #2  She underwent radiation therapy under the care of Dr. Mitzi Hansen from 10/05/07 to 11/23/07.  #3  She was started on Femara in July of 2009 and has tolerated it well since.  PLAN:  She is to follow up with Dr. Lovell Sheehan and her gynecologist for future care with annual physician breast examination and annual mammography.  She is advised to discontinue Femara at the end of June 2014, to complete a total of 5 years of therapy.  She is instructed to call for any needs or questions.  She is released from the practice and she is in agreement with the plan to follow up with her PCP and gynecologist.  The patient was reviewed with Dr. Darnelle Catalan, who spoke with the patient about future recommendations.  He advised her to follow up with her PCP and GYN and that she could be seen in the future at the [facility] if problems or concerns arise.    CROSS, MELISSA DEAL    08/18/2012

## 2012-08-18 NOTE — Patient Instructions (Signed)
Doing well.  Discontinue Femara use at the end of June 2014 to complete a total of 5 years of therapy.  Plan to follow up with your PCP and GYN in the future for care including an annual physician breast exam and annual mammography.  You are released from the practice and can follow up in the future if problems or concerns arise.  Please call for any questions or concerns.

## 2012-10-28 ENCOUNTER — Ambulatory Visit (INDEPENDENT_AMBULATORY_CARE_PROVIDER_SITE_OTHER): Payer: BC Managed Care – PPO | Admitting: Internal Medicine

## 2012-10-28 ENCOUNTER — Encounter: Payer: Self-pay | Admitting: Internal Medicine

## 2012-10-28 VITALS — BP 130/78 | HR 72 | Temp 98.2°F | Resp 16 | Ht 62.0 in | Wt 150.0 lb

## 2012-10-28 DIAGNOSIS — G609 Hereditary and idiopathic neuropathy, unspecified: Secondary | ICD-10-CM

## 2012-10-28 DIAGNOSIS — M810 Age-related osteoporosis without current pathological fracture: Secondary | ICD-10-CM

## 2012-10-28 MED ORDER — RALOXIFENE HCL 60 MG PO TABS: 60.0000 mg | ORAL_TABLET | Freq: Every day | ORAL | Status: DC

## 2012-10-28 NOTE — Progress Notes (Signed)
Subjective:    Patient ID: Anna Navarro, female    DOB: 10/30/1955, 57 y.o.   MRN: [id number]  HPI Discussion of weight management Mood stable Lipid management 5 year post CA  Following for  foxamax  Discussion of [place] of evista for bone and hx of breast cancer  Pain from breast surgery with gabepentim    Review of Systems  Constitutional: Negative for activity change, appetite change and fatigue.  HENT: Negative for ear pain, congestion, neck pain, postnasal drip and sinus pressure.   Eyes: Negative for redness and visual disturbance.  Respiratory: Negative for cough, shortness of breath and wheezing.   Gastrointestinal: Negative for abdominal pain and abdominal distention.  Genitourinary: Negative for dysuria, frequency and menstrual problem.  Musculoskeletal: Negative for myalgias, joint swelling and arthralgias.  Skin: Negative for rash and wound.  Neurological: Negative for dizziness, weakness and headaches.  Hematological: Negative for adenopathy. Does not bruise/bleed easily.  Psychiatric/Behavioral: Negative for sleep disturbance and decreased concentration.   Past Medical History  Diagnosis Date  . ALLERGIC RHINITIS 02/03/2007  . 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 DEPRESSION 11/12/2006  . 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 GERD 11/12/2006  . HYPERLIPIDEMIA 11/12/2006  . 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 LOW BACK PAIN 02/03/2007  . NECK MASS 01/03/2007  . WARTS, HAND 01/07/2010  . WEIGHT LOSS, RECENT 02/03/2007    History   Social History  . Marital Status: Married    Spouse Name: N/A    Number of Children: N/A  . Years of Education: N/A   Occupational History  . Not on file.   Social History Main Topics  . Smoking status: Never Smoker   . Smokeless tobacco: Not on file  . Alcohol Use: Yes  . Drug Use: No  . Sexually Active: Not on file   Other Topics Concern  . Not on  file   Social History Narrative  . No narrative on file    Past Surgical History  Procedure Laterality Date  . Gastric bypass    . Total abdominal hysterectomy      Family History  Problem Relation Age of Onset  . Heart attack Father   . Lung cancer Paternal Grandmother   . Colon cancer Paternal Grandmother     No Known Allergies  Current Outpatient Prescriptions on File Prior to Visit  Medication Sig Dispense Refill  . alendronate (FOSAMAX) 70 MG tablet Take 1 tablet (70 mg total) by mouth every 7 (seven) days. Take with a full glass of water on an empty stomach.  12 tablet  2  . Cholecalciferol (VITAMIN D3) 5000 UNITS TABS Take 1 capsule by mouth daily.  30 tablet    . desvenlafaxine (PRISTIQ) 50 MG 24 hr tablet Take 1 tablet (50 mg total) by mouth daily.  90 tablet  3  . gabapentin (NEURONTIN) 100 MG capsule       . letrozole (FEMARA) 2.5 MG tablet TAKE 1 TABLET BY MOUTH EVERY DAY  90 tablet  1  . nitrofurantoin (MACRODANTIN) 100 MG capsule Take 100 mg by mouth 3 (three) times a week.        No current facility-administered medications on file prior to visit.    BP 130/78  Pulse 72  Temp(Src) 98.2 F (36.8 C)  Resp 16  Ht 5\' 2"  (1.575 m)  Wt 150 lb (68.04 kg)  BMI 27.43 kg/m2       Objective:  Physical Exam  Nursing note and vitals reviewed. Constitutional: She is oriented to person, place, and time. She appears well-developed and well-nourished. No distress.  HENT:  Head: Normocephalic and atraumatic.  Eyes: Conjunctivae and EOM are normal. Pupils are equal, round, and reactive to light.  Neck: Normal range of motion. Neck supple. No JVD present. No tracheal deviation present. No thyromegaly present.  Cardiovascular: Normal rate and regular rhythm.   No murmur heard. Pulmonary/Chest: Effort normal and breath sounds normal. She has no wheezes. She exhibits no tenderness.  Abdominal: Soft. Bowel sounds are normal.  Musculoskeletal: Normal range of motion.  She exhibits no edema and no tenderness.  Lymphadenopathy:    She has no cervical adenopathy.  Neurological: She is alert and oriented to person, place, and time. She has normal reflexes. No cranial nerve deficit.  Skin: Skin is warm and dry. She is not diaphoretic.  Psychiatric: She has a normal mood and affect. Her behavior is normal.          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

## 2012-10-30 ENCOUNTER — Other Ambulatory Visit: Payer: Self-pay | Admitting: Internal Medicine

## 2012-12-13 ENCOUNTER — Other Ambulatory Visit: Payer: Self-pay | Admitting: Physician Assistant

## 2012-12-22 ENCOUNTER — Other Ambulatory Visit: Payer: Self-pay | Admitting: Internal Medicine

## 2013-02-28 ENCOUNTER — Encounter: Payer: Self-pay | Admitting: Gynecology

## 2013-04-04 ENCOUNTER — Encounter: Payer: Self-pay | Admitting: Internal Medicine

## 2013-04-05 ENCOUNTER — Other Ambulatory Visit: Payer: Self-pay | Admitting: *Deleted

## 2013-04-12 ENCOUNTER — Other Ambulatory Visit: Payer: Self-pay | Admitting: *Deleted

## 2013-04-14 ENCOUNTER — Other Ambulatory Visit (INDEPENDENT_AMBULATORY_CARE_PROVIDER_SITE_OTHER): Payer: BC Managed Care – PPO

## 2013-04-14 ENCOUNTER — Encounter: Payer: BC Managed Care – PPO | Admitting: Internal Medicine

## 2013-04-14 DIAGNOSIS — Z0000 Encounter for general adult medical examination without abnormal findings: Secondary | ICD-10-CM

## 2013-04-14 LAB — CBC WITH DIFFERENTIAL/PLATELET
Basophils Absolute: 0 10*3/uL (ref 0.0–0.1)
Basophils Relative: 1.2 % (ref 0.0–3.0)
Eosinophils Absolute: 0.1 10*3/uL (ref 0.0–0.7)
Eosinophils Relative: 2.7 % (ref 0.0–5.0)
HCT: 42 % (ref 36.0–46.0)
Hemoglobin: 14.1 g/dL (ref 12.0–15.0)
Lymphocytes Relative: 33.4 % (ref 12.0–46.0)
Lymphs Abs: 1.4 10*3/uL (ref 0.7–4.0)
MCHC: 33.5 g/dL (ref 30.0–36.0)
MCV: 91.2 fl (ref 78.0–100.0)
Monocytes Absolute: 0.3 10*3/uL (ref 0.1–1.0)
Monocytes Relative: 8.2 % (ref 3.0–12.0)
Neutro Abs: 2.2 10*3/uL (ref 1.4–7.7)
Neutrophils Relative %: 54.5 % (ref 43.0–77.0)
Platelets: 227 10*3/uL (ref 150.0–400.0)
RBC: 4.61 Mil/uL (ref 3.87–5.11)
RDW: 12.9 % (ref 11.5–14.6)
WBC: 4.1 10*3/uL — ABNORMAL LOW (ref 4.5–10.5)

## 2013-04-14 LAB — POCT URINALYSIS DIPSTICK
Bilirubin, UA: NEGATIVE
Glucose, UA: NEGATIVE
Ketones, UA: NEGATIVE
Nitrite, UA: NEGATIVE
Protein, UA: NEGATIVE
Spec Grav, UA: 1.02
Urobilinogen, UA: 0.2
pH, UA: 7

## 2013-04-14 LAB — BASIC METABOLIC PANEL
BUN: 14 mg/dL (ref 6–23)
CO2: 29 mEq/L (ref 19–32)
Calcium: 8.8 mg/dL (ref 8.4–10.5)
Chloride: 106 mEq/L (ref 96–112)
Creatinine, Ser: 0.7 mg/dL (ref 0.4–1.2)
GFR: 97.82 mL/min (ref >60.00)
Glucose, Bld: 87 mg/dL (ref 70–99)
Potassium: 4.3 mEq/L (ref 3.5–5.1)
Sodium: 140 mEq/L (ref 135–145)

## 2013-04-14 LAB — LIPID PANEL
Cholesterol: 184 mg/dL (ref 0–200)
HDL: 64.3 mg/dL (ref >39.00)
LDL Cholesterol: 108 mg/dL — ABNORMAL HIGH (ref 0–99)
Total CHOL/HDL Ratio: 3
Triglycerides: 59 mg/dL (ref 0.0–149.0)
VLDL: 11.8 mg/dL (ref 0.0–40.0)

## 2013-04-14 LAB — HEPATIC FUNCTION PANEL
ALT: 18 U/L (ref 0–35)
AST: 16 U/L (ref 0–37)
Albumin: 3.7 g/dL (ref 3.5–5.2)
Alkaline Phosphatase: 48 U/L (ref 39–117)
Bilirubin, Direct: 0 mg/dL (ref 0.0–0.3)
Total Bilirubin: 0.6 mg/dL (ref 0.3–1.2)
Total Protein: 6.2 g/dL (ref 6.0–8.3)

## 2013-04-14 LAB — TSH: TSH: 1.94 u[IU]/mL (ref 0.35–5.50)

## 2013-04-21 ENCOUNTER — Ambulatory Visit (INDEPENDENT_AMBULATORY_CARE_PROVIDER_SITE_OTHER): Payer: BC Managed Care – PPO | Admitting: Internal Medicine

## 2013-04-21 ENCOUNTER — Other Ambulatory Visit: Payer: Self-pay | Admitting: *Deleted

## 2013-04-21 ENCOUNTER — Encounter: Payer: Self-pay | Admitting: Internal Medicine

## 2013-04-21 VITALS — BP 136/78 | HR 72 | Temp 98.2°F | Resp 16 | Ht <= 58 in | Wt 155.0 lb

## 2013-04-21 DIAGNOSIS — Z0000 Encounter for general adult medical examination without abnormal findings: Secondary | ICD-10-CM

## 2013-04-21 NOTE — Patient Instructions (Signed)
The patient is instructed to continue all medications as prescribed. Schedule followup with check out clerk upon leaving the clinic  

## 2013-04-21 NOTE — Progress Notes (Signed)
Pre visit review using our clinic review tool, if applicable. No additional management support is needed unless otherwise documented below in the visit note. 

## 2013-04-21 NOTE — Progress Notes (Signed)
Subjective:    Patient ID: Anna Navarro, female    DOB: 11/05/1955, 57 y.o.   MRN: [id number]  HPI  cpx  Review of Systems  Constitutional: Negative for activity change, appetite change and fatigue.  HENT: Negative for congestion, ear pain, postnasal drip and sinus pressure.   Eyes: Negative for redness and visual disturbance.  Respiratory: Negative for cough, shortness of breath and wheezing.   Gastrointestinal: Negative for abdominal pain and abdominal distention.  Genitourinary: Negative for dysuria, frequency and menstrual problem.  Musculoskeletal: Negative for arthralgias, joint swelling, myalgias and neck pain.  Skin: Negative for rash and wound.  Neurological: Negative for dizziness, weakness and headaches.  Hematological: Negative for adenopathy. Does not bruise/bleed easily.  Psychiatric/Behavioral: Negative for sleep disturbance and decreased concentration.   Past Medical History  Diagnosis Date  . ALLERGIC RHINITIS 02/03/2007  . 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 DEPRESSION 11/12/2006  . 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 GERD 11/12/2006  . HYPERLIPIDEMIA 11/12/2006  . 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 LOW BACK PAIN 02/03/2007  . NECK MASS 01/03/2007  . WARTS, HAND 01/07/2010  . WEIGHT LOSS, RECENT 02/03/2007    History   Social History  . Marital Status: Married    Spouse Name: N/A    Number of Children: N/A  . Years of Education: N/A   Occupational History  . Not on file.   Social History Main Topics  . Smoking status: Never Smoker   . Smokeless tobacco: Not on file  . Alcohol Use: Yes  . Drug Use: No  . Sexual Activity: Not on file   Other Topics Concern  . Not on file   Social History Narrative  . No narrative on file    Past Surgical History  Procedure Laterality Date  . Gastric bypass    . Total abdominal hysterectomy      Family History  Problem Relation  Age of Onset  . Heart attack Father   . Lung cancer Paternal Grandmother   . Colon cancer Paternal Grandmother     No Known Allergies  Current Outpatient Prescriptions on File Prior to Visit  Medication Sig Dispense Refill  . Cholecalciferol (VITAMIN D3) 5000 UNITS TABS Take 1 capsule by mouth daily.  30 tablet    . desvenlafaxine (PRISTIQ) 50 MG 24 hr tablet Take 1 tablet (50 mg total) by mouth daily.  90 tablet  3  . omeprazole (PRILOSEC) 20 MG capsule TAKE 1 CAPSULE BY MOUTH EVERY DAY  30 capsule  11  . raloxifene (EVISTA) 60 MG tablet Take 1 tablet (60 mg total) by mouth daily.  30 tablet  11   No current facility-administered medications on file prior to visit.    BP 136/78  Pulse 72  Temp(Src) 98.2 F (36.8 C)  Resp 16  Ht 4' (1.219 m)  Wt 155 lb (70.308 kg)  BMI 47.31 kg/m2       Objective:   Physical Exam  Nursing note and vitals reviewed. Constitutional: She is oriented to person, place, and time. She appears well-developed and well-nourished. No distress.  HENT:  Head: Normocephalic and atraumatic.  Eyes: Conjunctivae and EOM are normal. Pupils are equal, round, and reactive to light.  Neck: Normal range of motion. Neck supple. No JVD present. No tracheal deviation present. No thyromegaly present.  Cardiovascular: Normal rate and regular rhythm.   No murmur heard. Pulmonary/Chest: Effort normal and breath sounds normal.  She has no wheezes. She exhibits no tenderness.  Abdominal: Soft. Bowel sounds are normal.  Musculoskeletal: Normal range of motion. She exhibits no edema and no tenderness.  Lymphadenopathy:    She has no cervical adenopathy.  Neurological: She is alert and oriented to person, place, and time. She has normal reflexes. No cranial nerve deficit.  Skin: Skin is warm and dry. She is not diaphoretic.  Psychiatric: She has a normal mood and affect. Her behavior is normal.          Assessment & Plan:   This is a routine physical examination  for this healthy  Female. Reviewed all health maintenance protocols including mammography colonoscopy bone density and reviewed appropriate screening labs. Her immunization history was reviewed as well as her current medications and allergies refills of her chronic medications were given and the plan for yearly health maintenance was discussed all orders and referrals were made as appropriate.

## 2013-05-28 ENCOUNTER — Other Ambulatory Visit: Payer: Self-pay | Admitting: Internal Medicine

## 2013-06-20 ENCOUNTER — Other Ambulatory Visit: Payer: Self-pay | Admitting: Internal Medicine

## 2013-07-15 ENCOUNTER — Encounter: Payer: Self-pay | Admitting: Internal Medicine

## 2013-07-17 ENCOUNTER — Other Ambulatory Visit: Payer: Self-pay | Admitting: Gynecology

## 2013-07-17 DIAGNOSIS — Z853 Personal history of malignant neoplasm of breast: Secondary | ICD-10-CM

## 2013-07-18 ENCOUNTER — Ambulatory Visit (INDEPENDENT_AMBULATORY_CARE_PROVIDER_SITE_OTHER): Payer: BC Managed Care – PPO | Admitting: Family

## 2013-07-18 ENCOUNTER — Encounter: Payer: Self-pay | Admitting: Family

## 2013-07-18 VITALS — BP 144/100 | HR 68 | Temp 98.0°F | Wt 158.0 lb

## 2013-07-18 DIAGNOSIS — R5383 Other fatigue: Secondary | ICD-10-CM

## 2013-07-18 DIAGNOSIS — R221 Localized swelling, mass and lump, neck: Secondary | ICD-10-CM

## 2013-07-18 DIAGNOSIS — R5381 Other malaise: Secondary | ICD-10-CM

## 2013-07-18 DIAGNOSIS — R220 Localized swelling, mass and lump, head: Secondary | ICD-10-CM

## 2013-07-18 NOTE — Progress Notes (Signed)
Subjective:    Patient ID: Anna Navarro, female    DOB: 01/30/1956, 58 y.o.   MRN: [id number]  HPI 58 year old white female, nonsmoker, patient of Dr. Arnoldo Morale with a history of breast cancer is in today with complaints of decreased energy, dizziness, headache and neck pain x1 day. She reports having a mass evaluated in 2009 per CT scan that was normal.   Review of Systems  Constitutional: Positive for fatigue.  Respiratory: Negative.   Cardiovascular: Negative.   Gastrointestinal: Negative.   Endocrine: Negative.   Musculoskeletal: Positive for neck pain.  Skin: Negative.   Neurological: Positive for dizziness and headaches.  Hematological: Negative.   Psychiatric/Behavioral: Negative.    Past Medical History  Diagnosis Date  . ALLERGIC RHINITIS 02/03/2007  . 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 DEPRESSION 11/12/2006  . 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 GERD 11/12/2006  . HYPERLIPIDEMIA 11/12/2006  . 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 LOW BACK PAIN 02/03/2007  . NECK MASS 01/03/2007  . WARTS, HAND 01/07/2010  . WEIGHT LOSS, RECENT 02/03/2007    History   Social History  . Marital Status: Married    Spouse Name: N/A    Number of Children: N/A  . Years of Education: N/A   Occupational History  . Not on file.   Social History Main Topics  . Smoking status: Never Smoker   . Smokeless tobacco: Not on file  . Alcohol Use: Yes  . Drug Use: No  . Sexual Activity: Not on file   Other Topics Concern  . Not on file   Social History Narrative  . No narrative on file    Past Surgical History  Procedure Laterality Date  . Gastric bypass    . Total abdominal hysterectomy      Family History  Problem Relation Age of Onset  . Heart attack Father   . Lung cancer Paternal Grandmother   . Colon cancer Paternal Grandmother     No Known Allergies  Current Outpatient Prescriptions on File  Prior to Visit  Medication Sig Dispense Refill  . Cholecalciferol (VITAMIN D3) 5000 UNITS TABS Take 1 capsule by mouth daily.  30 tablet    . gabapentin (NEURONTIN) 100 MG capsule TAKE 1 CAPSULE BY MOUTH THREE TIMES DAILY  90 capsule  5  . omeprazole (PRILOSEC) 20 MG capsule TAKE 1 CAPSULE BY MOUTH EVERY DAY  30 capsule  11  . PRISTIQ 50 MG 24 hr tablet TAKE 1 TABLET BY MOUTH EVERY DAY  90 tablet  0  . raloxifene (EVISTA) 60 MG tablet Take 1 tablet (60 mg total) by mouth daily.  30 tablet  11   No current facility-administered medications on file prior to visit.    BP 144/100  Pulse 68  Temp(Src) 98 F (36.7 C) (Oral)  Wt 158 lb (71.668 kg)chart    Objective:   Physical Exam  Constitutional: She is oriented to person, place, and time. She appears well-developed and well-nourished.  HENT:  Right Ear: External ear normal.  Left Ear: External ear normal.  Nose: Nose normal.  Mouth/Throat: Oropharynx is clear and moist.  Neck: Normal range of motion. Neck supple.    Cardiovascular: Normal rate, regular rhythm and normal heart sounds.   Pulmonary/Chest: Effort normal and breath sounds normal.  Musculoskeletal: Normal range of motion. She exhibits tenderness.  Mild right posterior neck tenderness at the area of the mass.   Neurological: She  is alert and oriented to person, place, and time.  Skin: Skin is warm and dry.  Psychiatric: She has a normal mood and affect.          Assessment & Plan:  Akaya was seen today for no specified reason.  Diagnoses and associated orders for this visit:  Mass in neck - CBC with Differential - TSH - CMP  Other malaise and fatigue - CBC with Differential - TSH - CMP    Consider ultrasound of the soft tissue mass of the posterior right neck Pending results of labs.

## 2013-07-18 NOTE — Patient Instructions (Signed)

## 2013-07-19 LAB — CBC WITH DIFFERENTIAL/PLATELET
Basophils Absolute: 0 10*3/uL (ref 0.0–0.1)
Basophils Relative: 0.7 % (ref 0.0–3.0)
Eosinophils Absolute: 0.1 10*3/uL (ref 0.0–0.7)
Eosinophils Relative: 1.4 % (ref 0.0–5.0)
HCT: 39.6 % (ref 36.0–46.0)
Hemoglobin: 13.2 g/dL (ref 12.0–15.0)
Lymphocytes Relative: 31.6 % (ref 12.0–46.0)
Lymphs Abs: 1.9 10*3/uL (ref 0.7–4.0)
MCHC: 33.3 g/dL (ref 30.0–36.0)
MCV: 92.6 fl (ref 78.0–100.0)
Monocytes Absolute: 0.5 10*3/uL (ref 0.1–1.0)
Monocytes Relative: 7.4 % (ref 3.0–12.0)
Neutro Abs: 3.6 10*3/uL (ref 1.4–7.7)
Neutrophils Relative %: 58.9 % (ref 43.0–77.0)
Platelets: 228 10*3/uL (ref 150.0–400.0)
RBC: 4.28 Mil/uL (ref 3.87–5.11)
RDW: 12.7 % (ref 11.5–14.6)
WBC: 6.1 10*3/uL (ref 4.5–10.5)

## 2013-07-19 LAB — COMPREHENSIVE METABOLIC PANEL
ALT: 24 U/L (ref 0–35)
AST: 20 U/L (ref 0–37)
Albumin: 3.8 g/dL (ref 3.5–5.2)
Alkaline Phosphatase: 51 U/L (ref 39–117)
BUN: 12 mg/dL (ref 6–23)
CO2: 31 mEq/L (ref 19–32)
Calcium: 9.2 mg/dL (ref 8.4–10.5)
Chloride: 105 mEq/L (ref 96–112)
Creatinine, Ser: 0.9 mg/dL (ref 0.4–1.2)
GFR: 67.46 mL/min (ref >60.00)
Glucose, Bld: 90 mg/dL (ref 70–99)
Potassium: 4.5 mEq/L (ref 3.5–5.1)
Sodium: 140 mEq/L (ref 135–145)
Total Bilirubin: 0.5 mg/dL (ref 0.3–1.2)
Total Protein: 6.5 g/dL (ref 6.0–8.3)

## 2013-07-19 LAB — TSH: TSH: 1.92 u[IU]/mL (ref 0.35–5.50)

## 2013-07-20 ENCOUNTER — Telehealth: Payer: Self-pay | Admitting: Family

## 2013-07-20 NOTE — Telephone Encounter (Signed)
advise patient an ultrasound will be scheduled for ultrasound of neck to evaluate mass.

## 2013-07-20 NOTE — Addendum Note (Signed)
Addended byRoxy Cedar B on: 07/20/2013 11:16 AM   Modules accepted: Orders

## 2013-07-20 NOTE — Telephone Encounter (Signed)
Left message to advise pt of note 

## 2013-07-26 ENCOUNTER — Ambulatory Visit
Admission: RE | Admit: 2013-07-26 | Discharge: 2013-07-26 | Disposition: A | Discharge status: Home / Self Care | Payer: BC Managed Care – PPO | Source: Ambulatory Visit | Attending: Family | Admitting: Family

## 2013-07-26 DIAGNOSIS — R5383 Other fatigue: Secondary | ICD-10-CM

## 2013-07-26 DIAGNOSIS — R221 Localized swelling, mass and lump, neck: Secondary | ICD-10-CM

## 2013-07-26 DIAGNOSIS — R5381 Other malaise: Secondary | ICD-10-CM

## 2013-08-16 ENCOUNTER — Other Ambulatory Visit: Payer: Self-pay | Admitting: Gynecology

## 2013-08-16 ENCOUNTER — Other Ambulatory Visit: Payer: Self-pay

## 2013-08-16 DIAGNOSIS — Z853 Personal history of malignant neoplasm of breast: Secondary | ICD-10-CM

## 2013-08-18 ENCOUNTER — Ambulatory Visit
Admission: RE | Admit: 2013-08-18 | Discharge: 2013-08-18 | Disposition: A | Discharge status: Home / Self Care | Payer: BC Managed Care – PPO | Source: Ambulatory Visit | Attending: Gynecology | Admitting: Gynecology

## 2013-08-18 DIAGNOSIS — Z853 Personal history of malignant neoplasm of breast: Secondary | ICD-10-CM

## 2013-08-27 ENCOUNTER — Other Ambulatory Visit: Payer: Self-pay | Admitting: Internal Medicine

## 2013-09-07 ENCOUNTER — Ambulatory Visit (INDEPENDENT_AMBULATORY_CARE_PROVIDER_SITE_OTHER): Payer: BC Managed Care – PPO | Admitting: Family Medicine

## 2013-09-07 VITALS — BP 132/80 | HR 60 | Wt 160.0 lb

## 2013-09-07 DIAGNOSIS — H8110 Benign paroxysmal vertigo, unspecified ear: Secondary | ICD-10-CM

## 2013-09-07 NOTE — Patient Instructions (Signed)

## 2013-09-07 NOTE — Progress Notes (Signed)
Pre visit review using our clinic review tool, if applicable. No additional management support is needed unless otherwise documented below in the visit note. 

## 2013-09-07 NOTE — Progress Notes (Signed)
   Subjective:    Patient ID: Anna Navarro, female    DOB: [birth year], 58 y.o.   MRN: [id number]  Dizziness Associated symptoms include headaches. Pertinent negatives include no chest pain, chills, coughing, fever, numbness or weakness.   Patient seen with dizziness. On further questioning she is describing vertigo symptoms which started acutely around 12:30 today at work. She's had some mild diffuse headache. No recent head injury. She's had some slight nausea but no vomiting. She had similar episode back in March. Screening lab work then was unremarkable. Symptoms are worse with turning head (to left) and also with opening her eyes. She's not any hearing changes. No speech changes. No recent nasal congestive symptoms. No focal weakness. No ataxia. No confusion.  Past Medical History  Diagnosis Date  . ALLERGIC RHINITIS 02/03/2007  . 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 DEPRESSION 11/12/2006  . 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 GERD 11/12/2006  . HYPERLIPIDEMIA 11/12/2006  . 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 LOW BACK PAIN 02/03/2007  . NECK MASS 01/03/2007  . WARTS, HAND 01/07/2010  . WEIGHT LOSS, RECENT 02/03/2007   Past Surgical History  Procedure Laterality Date  . Gastric bypass    . Total abdominal hysterectomy      reports that she has never smoked. She does not have any smokeless tobacco history on file. She reports that she drinks alcohol. She reports that she does not use illicit drugs. family history includes Colon cancer in her paternal grandmother; Heart attack in her father; Lung cancer in her paternal grandmother. No Known Allergies    Review of Systems  Constitutional: Negative for fever, chills and unexpected weight change.  HENT: Negative for trouble swallowing and voice change.   Eyes: Negative for visual disturbance.  Respiratory: Negative for cough.   Cardiovascular: Negative for  chest pain.  Musculoskeletal: Negative for gait problem.  Neurological: Positive for dizziness and headaches. Negative for weakness and numbness.       Objective:   Physical Exam  Constitutional: She is oriented to person, place, and time. She appears well-developed and well-nourished.  HENT:  Right Ear: External ear normal.  Left Ear: External ear normal.  Mouth/Throat: Oropharynx is clear and moist.  Eyes: Pupils are equal, round, and reactive to light.  Neck: Neck supple. No thyromegaly present.  Cardiovascular: Normal rate.   Pulmonary/Chest: Effort normal and breath sounds normal. No respiratory distress. She has no wheezes. She has no rales.  Lymphadenopathy:    She has no cervical adenopathy.  Neurological: She is alert and oriented to person, place, and time. No cranial nerve deficit. Coordination normal.  No visible nystagmus. Patient has reproducible symptoms when lying back with head to the right but not the left No visible nystagmus.          Assessment & Plan:  Vertigo. Suspect benign positional peripheral vertigo. Recommend vestibular rehabilitation. She does not have any Navarro flags for worrisome vertigo. We explained that medications do not generally work well for this.

## 2013-11-01 ENCOUNTER — Ambulatory Visit: Payer: BC Managed Care – PPO | Admitting: Internal Medicine

## 2013-12-07 ENCOUNTER — Other Ambulatory Visit: Payer: Self-pay | Admitting: Internal Medicine

## 2013-12-08 ENCOUNTER — Other Ambulatory Visit: Payer: Self-pay | Admitting: Family

## 2013-12-13 ENCOUNTER — Encounter: Payer: Self-pay | Admitting: Family

## 2013-12-13 ENCOUNTER — Ambulatory Visit (INDEPENDENT_AMBULATORY_CARE_PROVIDER_SITE_OTHER): Payer: BC Managed Care – PPO | Admitting: Family

## 2013-12-13 ENCOUNTER — Telehealth: Payer: Self-pay | Admitting: Family

## 2013-12-13 VITALS — BP 110/78 | HR 81 | Temp 98.2°F | Ht <= 58 in | Wt 165.0 lb

## 2013-12-13 DIAGNOSIS — R635 Abnormal weight gain: Secondary | ICD-10-CM

## 2013-12-13 DIAGNOSIS — K2100 Gastro-esophageal reflux disease with esophagitis, without bleeding: Secondary | ICD-10-CM

## 2013-12-13 DIAGNOSIS — F329 Major depressive disorder, single episode, unspecified: Secondary | ICD-10-CM

## 2013-12-13 DIAGNOSIS — H8110 Benign paroxysmal vertigo, unspecified ear: Secondary | ICD-10-CM

## 2013-12-13 DIAGNOSIS — E785 Hyperlipidemia, unspecified: Secondary | ICD-10-CM

## 2013-12-13 DIAGNOSIS — F32A Depression, unspecified: Secondary | ICD-10-CM

## 2013-12-13 DIAGNOSIS — F3289 Other specified depressive episodes: Secondary | ICD-10-CM

## 2013-12-13 LAB — CBC WITH DIFFERENTIAL/PLATELET
Basophils Absolute: 0 10*3/uL (ref 0.0–0.1)
Basophils Relative: 0.7 % (ref 0.0–3.0)
Eosinophils Absolute: 0.1 10*3/uL (ref 0.0–0.7)
Eosinophils Relative: 1.8 % (ref 0.0–5.0)
HCT: 40.1 % (ref 36.0–46.0)
Hemoglobin: 13.2 g/dL (ref 12.0–15.0)
Lymphocytes Relative: 32.7 % (ref 12.0–46.0)
Lymphs Abs: 1.8 10*3/uL (ref 0.7–4.0)
MCHC: 33 g/dL (ref 30.0–36.0)
MCV: 92.5 fl (ref 78.0–100.0)
Monocytes Absolute: 0.4 10*3/uL (ref 0.1–1.0)
Monocytes Relative: 7.8 % (ref 3.0–12.0)
Neutro Abs: 3.2 10*3/uL (ref 1.4–7.7)
Neutrophils Relative %: 57 % (ref 43.0–77.0)
Platelets: 226 10*3/uL (ref 150.0–400.0)
RBC: 4.34 Mil/uL (ref 3.87–5.11)
RDW: 13.1 % (ref 11.5–15.5)
WBC: 5.6 10*3/uL (ref 4.0–10.5)

## 2013-12-13 LAB — BASIC METABOLIC PANEL
BUN: 13 mg/dL (ref 6–23)
CO2: 29 mEq/L (ref 19–32)
Calcium: 8.7 mg/dL (ref 8.4–10.5)
Chloride: 104 mEq/L (ref 96–112)
Creatinine, Ser: 0.8 mg/dL (ref 0.4–1.2)
GFR: 77.05 mL/min (ref >60.00)
Glucose, Bld: 91 mg/dL (ref 70–99)
Potassium: 4.1 mEq/L (ref 3.5–5.1)
Sodium: 140 mEq/L (ref 135–145)

## 2013-12-13 LAB — LIPID PANEL
Cholesterol: 199 mg/dL (ref 0–200)
HDL: 68.7 mg/dL (ref >39.00)
LDL Cholesterol: 116 mg/dL — ABNORMAL HIGH (ref 0–99)
NonHDL: 130.3
Total CHOL/HDL Ratio: 3
Triglycerides: 74 mg/dL (ref 0.0–149.0)
VLDL: 14.8 mg/dL (ref 0.0–40.0)

## 2013-12-13 LAB — TSH: TSH: 1.72 u[IU]/mL (ref 0.35–4.50)

## 2013-12-13 NOTE — Progress Notes (Signed)
Pre visit review using our clinic review tool, if applicable. No additional management support is needed unless otherwise documented below in the visit note. 

## 2013-12-13 NOTE — Telephone Encounter (Signed)
See if patient is willing to see another provider for issue

## 2013-12-13 NOTE — Patient Instructions (Signed)
Exercise to Lose Weight Exercise and a healthy diet may help you lose weight. Your doctor may suggest specific exercises. EXERCISE IDEAS AND TIPS  Choose low-cost things you enjoy doing, such as walking, bicycling, or exercising to workout videos.  Take stairs instead of the elevator.  Walk during your lunch break.  Park your car further away from work or school.  Go to a gym or an exercise class.  Start with 5 to 10 minutes of exercise each day. Build up to 30 minutes of exercise 4 to 6 days a week.  Wear shoes with good support and comfortable clothes.  Stretch before and after working out.  Work out until you breathe harder and your heart beats faster.  Drink extra water when you exercise.  Do not do so much that you hurt yourself, feel dizzy, or get very short of breath. Exercises that burn about 150 calories:  Running 1  miles in 15 minutes.  Playing volleyball for 45 to 60 minutes.  Washing and waxing a car for 45 to 60 minutes.  Playing touch football for 45 minutes.  Walking 1  miles in 35 minutes.  Pushing a stroller 1  miles in 30 minutes.  Playing basketball for 30 minutes.  Raking leaves for 30 minutes.  Bicycling 5 miles in 30 minutes.  Walking 2 miles in 30 minutes.  Dancing for 30 minutes.  Shoveling snow for 15 minutes.  Swimming laps for 20 minutes.  Walking up stairs for 15 minutes.  Bicycling 4 miles in 15 minutes.  Gardening for 30 to 45 minutes.  Jumping rope for 15 minutes.  Washing windows or floors for 45 to 60 minutes. Document Released: 06/06/2010 Document Revised: 07/27/2011 Document Reviewed: 06/06/2010 [employer] Patient Information 2015 [employer]. This information is not intended to replace advice given to you by your health care provider. Make sure you discuss any questions you have with your health care provider.  

## 2013-12-13 NOTE — Telephone Encounter (Signed)
Called to referrer pt to Dr. Charleston Ropes , their office states they can not see the patient just for Benign paroxysmal positional vertigo, unspecified laterality unless she has some hearing lost , please advise .

## 2013-12-13 NOTE — Progress Notes (Signed)
Subjective:    Patient ID: Anna Navarro, female    DOB: 1955/06/06, 58 y.o.   MRN: [id number]  HPI 58 year old white female, nonsmoker is in today for recheck of hyperlipidemia, depression, hypercholesterolemia. Has concerns today of dizziness that an ongoing issue over the last year. She has been to the vestibular rehabilitation without much relief. Takes meclizine that helps at times. The frequency and duration of the dizziness has decreased. Reports experiencing dizziness about once every 2 weeks lasting a few seconds and goes away.  Patient has a history of bariatric surgery. Is concerned because she is beginning to gain weight. Reports exercising with a trainer about twice a week.   Review of Systems  Constitutional: Positive for unexpected weight change.       Weight gain  Respiratory: Negative.   Cardiovascular: Negative.   Gastrointestinal: Negative.   Endocrine: Negative.  Negative for cold intolerance and heat intolerance.  Musculoskeletal: Negative.   Skin: Negative.   Allergic/Immunologic: Negative.   Neurological: Positive for dizziness. Negative for weakness, light-headedness and numbness.  Hematological: Negative.   Psychiatric/Behavioral: Negative.    Past Medical History  Diagnosis Date  . ALLERGIC RHINITIS 02/03/2007  . 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 DEPRESSION 11/12/2006  . 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 GERD 11/12/2006  . HYPERLIPIDEMIA 11/12/2006  . 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 LOW BACK PAIN 02/03/2007  . NECK MASS 01/03/2007  . WARTS, HAND 01/07/2010  . WEIGHT LOSS, RECENT 02/03/2007    History   Social History  . Marital Status: Married    Spouse Name: N/A    Number of Children: N/A  . Years of Education: N/A   Occupational History  . Not on file.   Social History Main Topics  . Smoking status: Never Smoker   . Smokeless tobacco: Not on file  . Alcohol  Use: Yes  . Drug Use: No  . Sexual Activity: Not on file   Other Topics Concern  . Not on file   Social History Narrative  . No narrative on file    Past Surgical History  Procedure Laterality Date  . Gastric bypass    . Total abdominal hysterectomy      Family History  Problem Relation Age of Onset  . Heart attack Father   . Lung cancer Paternal Grandmother   . Colon cancer Paternal Grandmother     No Known Allergies  Current Outpatient Prescriptions on File Prior to Visit  Medication Sig Dispense Refill  . Cholecalciferol (VITAMIN D3) 5000 UNITS TABS Take 1 capsule by mouth daily.  30 tablet    . gabapentin (NEURONTIN) 100 MG capsule TAKE 1 CAPSULE BY MOUTH THREE TIMES DAILY  90 capsule  5  . omeprazole (PRILOSEC) 20 MG capsule TAKE 1 CAPSULE BY MOUTH EVERY DAY  30 capsule  11  . PRISTIQ 50 MG 24 hr tablet TAKE 1 TABLET BY MOUTH EVERY DAY  90 tablet  0  . raloxifene (EVISTA) 60 MG tablet TAKE 1 TABLET BY MOUTH EVERY DAY  90 tablet  3   No current facility-administered medications on file prior to visit.    BP 110/78  Pulse 81  Temp(Src) 98.2 F (36.8 C) (Oral)  Ht 4' (1.219 m)  Wt 165 lb (74.844 kg)  BMI 50.37 kg/m2chart    Objective:   Physical Exam  Constitutional: She is oriented to person, place, and time. She appears well-developed and well-nourished.  HENT:  Right Ear: External ear normal.  Left Ear: External ear normal.  Nose: Nose normal.  Mouth/Throat: Oropharynx is clear and moist.  Neck: Normal range of motion. Neck supple. No thyromegaly present.  Cardiovascular: Normal rate, regular rhythm and normal heart sounds.   Pulmonary/Chest: Effort normal and breath sounds normal.  Abdominal: Soft. Bowel sounds are normal.  Musculoskeletal: Normal range of motion.  Neurological: She is alert and oriented to person, place, and time.  Skin: Skin is warm and dry.  Psychiatric: She has a normal mood and affect.          Assessment & Plan:  Anna Navarro  was seen today for follow-up.  Diagnoses and associated orders for this visit:  Other and unspecified hyperlipidemia - Basic Metabolic Panel - Lipid Panel  Depression - TSH  Gastroesophageal reflux disease with esophagitis - CBC with Differential  Weight gain - TSH  Benign paroxysmal positional vertigo, unspecified laterality - TSH - CBC with Differential - Ambulatory referral to ENT     Encouraged healthy diet, exercise. Refer to ENT. Consider referral to nutritionist for weight gain. Continue current medications. Followup pending lab and in 6 months.

## 2013-12-22 ENCOUNTER — Encounter: Payer: Self-pay | Admitting: Family

## 2013-12-22 DIAGNOSIS — H8113 Benign paroxysmal vertigo, bilateral: Secondary | ICD-10-CM

## 2013-12-25 ENCOUNTER — Other Ambulatory Visit: Payer: Self-pay | Admitting: Internal Medicine

## 2013-12-27 ENCOUNTER — Encounter: Payer: Self-pay | Admitting: Family

## 2013-12-29 ENCOUNTER — Other Ambulatory Visit: Payer: Self-pay | Admitting: Otolaryngology

## 2013-12-29 DIAGNOSIS — H814 Vertigo of central origin: Secondary | ICD-10-CM

## 2014-01-04 ENCOUNTER — Ambulatory Visit: Payer: Self-pay | Admitting: Gynecology

## 2014-01-05 ENCOUNTER — Other Ambulatory Visit: Payer: BC Managed Care – PPO

## 2014-01-05 ENCOUNTER — Ambulatory Visit (INDEPENDENT_AMBULATORY_CARE_PROVIDER_SITE_OTHER): Payer: BC Managed Care – PPO | Admitting: Gynecology

## 2014-01-05 ENCOUNTER — Encounter: Payer: Self-pay | Admitting: Gynecology

## 2014-01-05 VITALS — BP 110/70 | HR 68 | Resp 16 | Ht 60.5 in | Wt 164.0 lb

## 2014-01-05 DIAGNOSIS — R635 Abnormal weight gain: Secondary | ICD-10-CM

## 2014-01-05 DIAGNOSIS — Z0000 Encounter for general adult medical examination without abnormal findings: Secondary | ICD-10-CM

## 2014-01-05 DIAGNOSIS — Z9884 Bariatric surgery status: Secondary | ICD-10-CM

## 2014-01-05 DIAGNOSIS — Z01419 Encounter for gynecological examination (general) (routine) without abnormal findings: Secondary | ICD-10-CM

## 2014-01-05 LAB — POCT URINALYSIS DIPSTICK
Bilirubin, UA: NEGATIVE
Blood, UA: NEGATIVE
Glucose, UA: NEGATIVE
Ketones, UA: NEGATIVE
Nitrite, UA: NEGATIVE
Protein, UA: NEGATIVE
Urobilinogen, UA: NEGATIVE
pH, UA: 5

## 2014-01-05 NOTE — Progress Notes (Signed)
58 y.o. Married Caucasian female   [id number] here for annual exam. She does not report hot flashes, does not have night sweats, does have vaginal dryness.  She is using lubricants, Olive Oil.  She does not report post-menopasual bleeding  No LMP recorded. Patient has had a hysterectomy.          Sexually active: Yes.    The current method of family planning is status post hysterectomy.    Exercising: Yes.    Bike, Gym 4 -5 days weekly Last pap: 02/2005 - Neg Abnormal PAP: No Mammogram: 08/2013 BIRADS2 Benign  BSE: yes, Every few Months  Colonoscopy: 2009 - repeat in 10 years  DEXA: 11/2010 Alcohol: Yes, Occ Tobacco: No  Health Maintenance  Topic Date Due  . Colonoscopy  06/15/2005  . Pap Smear  01/07/2013  . Influenza Vaccine  12/16/2013  . Tetanus/tdap  04/21/2014 (Originally 05/19/2003)  . Mammogram  08/19/2015    Family History  Problem Relation Age of Onset  . Heart attack Father   . Lung cancer Paternal Grandmother   . Colon cancer Paternal Grandmother     Patient Active Problem List   Diagnosis Date Noted  . History of breast cancer 08/18/2012  . 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 WARTS, HAND 01/07/2010  . BARIATRIC SURGERY STATUS 08/22/2008  . 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 ALLERGIC RHINITIS 02/03/2007  . LOW BACK PAIN 02/03/2007  . WEIGHT LOSS, RECENT 02/03/2007  . NECK MASS 01/03/2007  . HYPERLIPIDEMIA 11/12/2006  . DEPRESSION 11/12/2006  . GERD 11/12/2006    Past Medical History  Diagnosis Date  . ALLERGIC RHINITIS 02/03/2007  . 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 DEPRESSION 11/12/2006  . 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 GERD 11/12/2006  . HYPERLIPIDEMIA 11/12/2006  . 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 LOW BACK PAIN 02/03/2007  . NECK MASS 01/03/2007  . WARTS, HAND 01/07/2010  . WEIGHT LOSS, RECENT  02/03/2007    Past Surgical History  Procedure Laterality Date  . Gastric bypass    . Total abdominal hysterectomy      Allergies: Review of patient's allergies indicates no known allergies.  Current Outpatient Prescriptions  Medication Sig Dispense Refill  . Cholecalciferol (VITAMIN D3) 5000 UNITS TABS Take 1 capsule by mouth daily.  30 tablet    . gabapentin (NEURONTIN) 100 MG capsule TAKE 1 CAPSULE BY MOUTH THREE TIMES DAILY  90 capsule  5  . magnesium 30 MG tablet Take 30 mg by mouth daily.      Marland Kitchen omeprazole (PRILOSEC) 20 MG capsule TAKE ONE CAPSULE BY MOUTH EVERY DAY  90 capsule  1  . PRISTIQ 50 MG 24 hr tablet TAKE 1 TABLET BY MOUTH EVERY DAY  90 tablet  0  . raloxifene (EVISTA) 60 MG tablet TAKE 1 TABLET BY MOUTH EVERY DAY  90 tablet  3  . vitamin B-12 (CYANOCOBALAMIN) 250 MCG tablet Take 250 mcg by mouth daily.       No current facility-administered medications for this visit.    ROS: Pertinent items are noted in HPI.  Exam:    BP 110/70  Pulse 68  Resp 16  Ht 5' 0.5" (1.537 m)  Wt 164 lb (74.39 kg)  BMI 31.49 kg/m2 Weight change: @WEIGHTCHANGE @ Last 3 height recordings:  Ht Readings from Last 3 Encounters:  01/05/14 5' 0.5" (1.537 m)  12/13/13 4' (1.219  m)  04/21/13 4' (1.219 m)   General appearance: alert, cooperative and appears stated age Head: Normocephalic, without obvious abnormality, atraumatic Neck: no adenopathy, no carotid bruit, no JVD, supple, symmetrical, trachea midline and thyroid not enlarged, symmetric, no tenderness/mass/nodules Lungs: clear to auscultation bilaterally Breasts: normal appearance, no masses or tenderness, right breast notable for RT change and scar only Heart: regular rate and rhythm, S1, S2 normal, no murmur, click, rub or gallop Abdomen: soft, non-tender; bowel sounds normal; no masses,  no organomegaly Extremities: extremities normal, atraumatic, no cyanosis or edema Skin: Skin color, texture, turgor normal. No rashes or  lesions Lymph nodes: Cervical, supraclavicular, and axillary nodes normal. no inguinal nodes palpated Neurologic: Grossly normal   Pelvic: External genitalia:  no lesions              Urethra: normal appearing urethra with no masses, tenderness or lesions              Bartholins and Skenes: Bartholin's, Urethra, Skene's normal                 Vagina: normal appearing vagina with normal color and discharge, no lesions              Cervix: absent              Pap taken: no        Bimanual Exam:  Uterus:  absent                                      Adnexa:    no masses                                      Rectovaginal: Confirms                                      Anus:  normal sphincter tone, no lesions        An After Visit Summary was printed and given to the patient.  1. Routine gynecological examination counseled on breast self exam, mammography screening, adequate intake of calcium and vitamin D, diet and exercise return annually or prn Discussed PAP guideline changes, importance of weight bearing exercises, calcium, vit D and balanced diet.   2. Laboratory examination ordered as part of a routine general medical examination Labs at PCP - POCT Urinalysis Dipstick  3. Weight gain She has discussed briefly with PCP but no referral made  - Ambulatory referral to Nutrition and Diabetic Education  4. S/P gastric bypass Pt has regained much of her weight since bypass, she is riding her bike but approx 5-40m only, discussed increasing speed or changing exercise so that she has elevation in hear rate which would help with weight loss and cardiovascular benefits. - Ambulatory referral to Nutrition and Diabetic Education

## 2014-01-10 ENCOUNTER — Ambulatory Visit
Admission: RE | Admit: 2014-01-10 | Discharge: 2014-01-10 | Disposition: A | Discharge status: Home / Self Care | Payer: BC Managed Care – PPO | Source: Ambulatory Visit | Attending: Otolaryngology | Admitting: Otolaryngology

## 2014-01-10 DIAGNOSIS — H814 Vertigo of central origin: Secondary | ICD-10-CM

## 2014-01-10 MED ORDER — GADOBENATE DIMEGLUMINE 529 MG/ML IV SOLN
14.0000 mL | Freq: Once | INTRAVENOUS | Status: AC | PRN
  Administered 2014-01-10: 14 mL via INTRAVENOUS

## 2014-02-19 ENCOUNTER — Ambulatory Visit: Payer: BC Managed Care – PPO | Admitting: Dietician

## 2014-03-17 ENCOUNTER — Encounter: Payer: Self-pay | Admitting: Family

## 2014-03-19 ENCOUNTER — Other Ambulatory Visit: Payer: Self-pay | Admitting: Family

## 2014-03-19 ENCOUNTER — Encounter: Payer: Self-pay | Admitting: Gynecology

## 2014-03-19 MED ORDER — ALENDRONATE SODIUM 70 MG PO TABS: 70.0000 mg | ORAL_TABLET | ORAL | Status: DC

## 2014-03-22 ENCOUNTER — Ambulatory Visit (INDEPENDENT_AMBULATORY_CARE_PROVIDER_SITE_OTHER): Payer: BC Managed Care – PPO | Admitting: Family Medicine

## 2014-03-22 ENCOUNTER — Encounter: Payer: Self-pay | Admitting: Family Medicine

## 2014-03-22 VITALS — BP 110/68 | Temp 98.0°F | Wt 165.0 lb

## 2014-03-22 DIAGNOSIS — N3000 Acute cystitis without hematuria: Secondary | ICD-10-CM

## 2014-03-22 DIAGNOSIS — R300 Dysuria: Secondary | ICD-10-CM

## 2014-03-22 LAB — POCT URINALYSIS DIPSTICK
Bilirubin, UA: NEGATIVE
Blood, UA: NEGATIVE
Glucose, UA: NEGATIVE
Ketones, UA: NEGATIVE
Nitrite, UA: NEGATIVE
Protein, UA: NEGATIVE
Spec Grav, UA: 1.01
Urobilinogen, UA: 0.2
pH, UA: 7

## 2014-03-22 MED ORDER — CEPHALEXIN 500 MG PO CAPS: 500.0000 mg | ORAL_CAPSULE | Freq: Three times a day (TID) | ORAL | Status: DC

## 2014-03-22 NOTE — Progress Notes (Signed)
Pre visit review using our clinic review tool, if applicable. No additional management support is needed unless otherwise documented below in the visit note. 

## 2014-03-22 NOTE — Patient Instructions (Signed)
Suspect UTI  Urine for culture  Keflex for 7 days-complete course  Unless we call you for resistance

## 2014-03-22 NOTE — Progress Notes (Signed)
  Garret Reddish, MD Phone: [phone]  Subjective:   Anna Navarro is a 58 y.o. year old very pleasant female patient who presents with the following:  UTI concern:  Presents with 3 days of dysuria, urinary urgency and urinary frequency Associated symptoms include:  None ROS-chills, cloudy urine, dysuria, fever none, hematuria, lower abdominal pain, nausea, sweats and vomiting. No vaginal discharge. No abnormal odor.   History There is a previous history of of similar symptoms. History of recurrent UTI requiring prophylaxis. Stopped this over a year ago and this is first time she has had symtpoms.  Sexually active:  Yes with female (married 36 years) No concern for STI.  Past Medical History- HLD, osteoporosis, depression  Medications- reviewed and updated Current Outpatient Prescriptions  Medication Sig Dispense Refill  . alendronate (FOSAMAX) 70 MG tablet Take 1 tablet (70 mg total) by mouth every 7 (seven) days. Take with a full glass of water on an empty stomach. 4 tablet 5  . Cholecalciferol (VITAMIN D3) 5000 UNITS TABS Take 1 capsule by mouth daily. 30 tablet   . gabapentin (NEURONTIN) 100 MG capsule TAKE 1 CAPSULE BY MOUTH THREE TIMES DAILY 90 capsule 5  . magnesium 30 MG tablet Take 30 mg by mouth daily.    Marland Kitchen omeprazole (PRILOSEC) 20 MG capsule TAKE ONE CAPSULE BY MOUTH EVERY DAY 90 capsule 1  . PRISTIQ 50 MG 24 hr tablet TAKE 1 TABLET BY MOUTH EVERY DAY 90 tablet 0  . vitamin B-12 (CYANOCOBALAMIN) 250 MCG tablet Take 250 mcg by mouth daily.     No current facility-administered medications for this visit.    Objective: BP 110/68 mmHg  Temp(Src) 98 F (36.7 C) (Oral)  Wt 165 lb (74.844 kg) Gen: NAD, resting comfortably in chair CV: RRR no murmurs rubs or gallops Lungs: CTAB no crackles, wheeze, rhonchi No CVA tenderness Abdomen: soft/no suprapubic tenderness/nondistended/normal bowel sounds. No rebound or guarding.  Skin: warm, dry, no rash Neuro: grossly  normal, moves all extremities   Results for orders placed or performed in visit on 03/22/14 (from the past 24 hour(s))  POCT urinalysis dipstick     Status: None   Collection Time: 03/22/14  3:42 PM  Result Value Ref Range   Color, UA yellow    Clarity, UA clear    Glucose, UA n    Bilirubin, UA n    Ketones, UA n    Spec Grav, UA 1.010    Blood, UA n    pH, UA 7.0    Protein, UA n    Urobilinogen, UA 0.2    Nitrite, UA n    Leukocytes, UA moderate (2+)    Assessment/Plan:  UTI   Urine for culture given history of prophylaxis and potential resistance  Keflex for 7 days-complete course  No signs pyelo  Return precautions advised.   Orders Placed This Encounter  Procedures  . Urine culture    [facility]  . POCT urinalysis dipstick    Standing Status: Standing     Number of Occurrences: 1     Standing Expiration Date:     Meds ordered this encounter  Medications  . cephALEXin (KEFLEX) 500 MG capsule    Sig: Take 1 capsule (500 mg total) by mouth 3 (three) times daily.    Dispense:  21 capsule    Refill:  0

## 2014-03-25 LAB — URINE CULTURE: Colony Count: >=100000

## 2014-04-11 ENCOUNTER — Other Ambulatory Visit: Payer: Self-pay | Admitting: Neurology

## 2014-04-11 ENCOUNTER — Encounter: Payer: Self-pay | Admitting: Neurology

## 2014-04-11 ENCOUNTER — Ambulatory Visit (INDEPENDENT_AMBULATORY_CARE_PROVIDER_SITE_OTHER): Payer: BC Managed Care – PPO | Admitting: Neurology

## 2014-04-11 VITALS — BP 126/80 | HR 92 | Ht 61.5 in | Wt 164.0 lb

## 2014-04-11 DIAGNOSIS — G43109 Migraine with aura, not intractable, without status migrainosus: Secondary | ICD-10-CM

## 2014-04-11 DIAGNOSIS — G43809 Other migraine, not intractable, without status migrainosus: Secondary | ICD-10-CM

## 2014-04-11 MED ORDER — TOPIRAMATE 25 MG PO TABS: 25.0000 mg | ORAL_TABLET | Freq: Every day | ORAL | Status: DC

## 2014-04-11 NOTE — Patient Instructions (Signed)
These could be vestibular migraines. 1.  We will start a preventative medication.  We will start topiramate 25mg  at bedtime.  Possible side effects include: impaired thinking, sedation, paresthesias (numbness and tingling) and weight loss.  It may cause dehydration and there is a small risk for kidney stones, so make sure to stay hydrated with water during the day.  There is also a very small risk for glaucoma, so if you notice any change in your vision while taking this medication, see an ophthalmologist.  Please call in 4 weeks for update and we can increase dose if needed. 2.  Follow up in 3 months.

## 2014-04-11 NOTE — Progress Notes (Signed)
Note faxed to Dr Thornell Mule at [phone] with confirmation received.

## 2014-04-11 NOTE — Telephone Encounter (Signed)
Patient prefers 90 day supply. RX authorized.

## 2014-04-11 NOTE — Progress Notes (Signed)
NEUROLOGY CONSULTATION NOTE  Anna Navarro MRN: [id number] DOB: 03/30/1956  Referring provider: Dr. Thornell Mule Primary care provider: Ms. Megan Salon  Reason for consult:  Vertigo  HISTORY OF PRESENT ILLNESS: Anna Navarro is a 58 year old right-handed woman with depression and history of breast cancer status post lumpectomy and radiation in 2009 who presents for dizziness.  Records from the [facility] and MRI of the brain personally reviewed.  She began having recurrent episodes of dizziness in March.  She describes it as a sensation of movement, like during a drop on a roller coaster, as well as spinning.  It occurs spontaneously and at rest, but can be exacerbated with movement.  Sometimes it is so severe, she was unable to drive and needed her husband to come and pick her up.  She may feel a little queasy, but she denies any significant nausea and no vomiting.  She does endorse photophobia, phonophobia and osmophobia.  She denies lightheadedness, vision loss, focal numbness or weakness, or slurred speech. It is associated with a dull, 6/10 non-throbbing holocephalic ache.  There are no known triggers.  Only rest and sleep help.  It typically lasts 2-3 days and occurred once a week to once every other week.  However, she has been symptom-free for the past month.  She has taken meclizine, which is not too effective.  Even though the spells are episodic, she didn't quite feel right in between spells.  She was evaluated by Dr. Thornell Mule at the [facility].  An ENG showed central findings.  She had an MRI of the brain with and without contrast, which was reportedly normal.  She denies preceding viral illness or head trauma.  She denies prior history of migraine.  She denies family history of migraine.  PAST MEDICAL HISTORY: Past Medical History  Diagnosis Date  . ALLERGIC RHINITIS 02/03/2007  . 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  DEPRESSION 11/12/2006  . 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 GERD 11/12/2006  . HYPERLIPIDEMIA 11/12/2006  . 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 LOW BACK PAIN 02/03/2007  . NECK MASS 01/03/2007  . WARTS, HAND 01/07/2010  . WEIGHT LOSS, RECENT 02/03/2007    PAST SURGICAL HISTORY: Past Surgical History  Procedure Laterality Date  . Gastric bypass    . Total abdominal hysterectomy    . Breast lumpectomy with axillary lymph node biopsy Right 08/2007    RT, ER/PR+, T1N0    MEDICATIONS: Current Outpatient Prescriptions on File Prior to Visit  Medication Sig Dispense Refill  . alendronate (FOSAMAX) 70 MG tablet Take 1 tablet (70 mg total) by mouth every 7 (seven) days. Take with a full glass of water on an empty stomach. 4 tablet 5  . Cholecalciferol (VITAMIN D3) 5000 UNITS TABS Take 1 capsule by mouth daily. 30 tablet   . gabapentin (NEURONTIN) 100 MG capsule TAKE 1 CAPSULE BY MOUTH THREE TIMES DAILY (Patient taking differently: take 1 capsule at night) 90 capsule 5  . magnesium 30 MG tablet Take 30 mg by mouth daily.    Marland Kitchen omeprazole (PRILOSEC) 20 MG capsule TAKE ONE CAPSULE BY MOUTH EVERY DAY 90 capsule 1  . PRISTIQ 50 MG 24 hr tablet TAKE 1 TABLET BY MOUTH EVERY DAY 90 tablet 0  . vitamin B-12 (CYANOCOBALAMIN) 250 MCG tablet Take 250 mcg by mouth daily.     No current facility-administered medications on file prior to visit.  ALLERGIES: No Known Allergies  FAMILY HISTORY: Family History  Problem Relation Age of Onset  . Heart attack Father   . Lung cancer Paternal Grandmother   . Colon cancer Paternal Grandmother     SOCIAL HISTORY: History   Social History  . Marital Status: Married    Spouse Name: N/A    Number of Children: N/A  . Years of Education: N/A   Occupational History  . Not on file.   Social History Main Topics  . Smoking status: Never Smoker   . Smokeless tobacco: Never Used  . Alcohol Use: 1.2 oz/week    2 Glasses  of wine per week     Comment: once every two weeks   . Drug Use: No  . Sexual Activity: Yes    Birth Control/ Protection: Surgical   Other Topics Concern  . Not on file   Social History Narrative    REVIEW OF SYSTEMS: Constitutional: No fevers, chills, or sweats, no generalized fatigue, change in appetite Eyes: No visual changes, double vision, eye pain Ear, nose and throat: No hearing loss, ear pain, nasal congestion, sore throat Cardiovascular: No chest pain, palpitations Respiratory:  No shortness of breath at rest or with exertion, wheezes GastrointestinaI: No nausea, vomiting, diarrhea, abdominal pain, fecal incontinence Genitourinary:  No dysuria, urinary retention or frequency Musculoskeletal:  No neck pain, back pain Integumentary: No rash, pruritus, skin lesions Neurological: as above Psychiatric: No depression, insomnia, anxiety Endocrine: No palpitations, fatigue, diaphoresis, mood swings, change in appetite, change in weight, increased thirst Hematologic/Lymphatic:  No anemia, purpura, petechiae. Allergic/Immunologic: no itchy/runny eyes, nasal congestion, recent allergic reactions, rashes  PHYSICAL EXAM: Filed Vitals:   04/11/14 0753  BP: 126/80  Pulse: 92   General: No acute distress Head:  Normocephalic/atraumatic Eyes:  fundi unremarkable, without vessel changes, exudates, hemorrhages or papilledema. CN III, IV, VI:  full range of motion, no nystagmus, no ptosis Neck: supple, no paraspinal tenderness, full range of motion Back: No paraspinal tenderness Heart: regular rate and rhythm Lungs: Clear to auscultation bilaterally. Vascular: No carotid bruits. Neurological Exam: Mental status: alert and oriented to person, place, and time, recent and remote memory intact, fund of knowledge intact, attention and concentration intact, speech fluent and not dysarthric, language intact. Cranial nerves: CN I: not tested CN II: pupils equal, round and reactive to  light, visual fields intact, fundi unremarkable, without vessel changes, exudates, hemorrhages or papilledema. CN III, IV, VI:  full range of motion, no nystagmus, no ptosis CN V: facial sensation intact CN VII: upper and lower face symmetric CN VIII: hearing intact CN IX, X: gag intact, uvula midline CN XI: sternocleidomastoid and trapezius muscles intact CN XII: tongue midline Bulk & Tone: normal, no fasciculations. Motor:  5/5 throughout Sensation:  Temperature and vibration intact Deep Tendon Reflexes:  2+ throughout, toes downgoing Finger to nose testing:  No dysmetria Heel to shin:  No dysmetria Gait:  Normal station and stride.  Able to turn and walk in tandem. Romberg negative.  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  TIA unlikely given the recurrent spells over several months.  PLAN: 1.  She has been doing well but a recurrence is still possible.  Therefore, we will start topiramate 25mg  daily as a preventative and see how she does.  Side effects discussed. 2.  She will follow up in 3 months.  Thank you for allowing me to take part in the care of this patient.  Metta Clines, DO  CC:  Vicie Mutters, MD  Roxy Cedar, FNP

## 2014-04-23 ENCOUNTER — Encounter: Payer: Self-pay | Admitting: Neurology

## 2014-04-23 ENCOUNTER — Telehealth: Payer: Self-pay

## 2014-04-23 MED ORDER — DESVENLAFAXINE SUCCINATE ER 50 MG PO TB24: 50.0000 mg | ORAL_TABLET | Freq: Every day | ORAL | Status: DC

## 2014-04-23 NOTE — Telephone Encounter (Signed)
Rx request for Pristiq 50 mg tab- Take 1 tablet by mouth every day #90 Pharm: [facility]

## 2014-04-23 NOTE — Telephone Encounter (Signed)
Done

## 2014-05-20 ENCOUNTER — Encounter: Payer: Self-pay | Admitting: Neurology

## 2014-05-24 ENCOUNTER — Encounter: Payer: Self-pay | Admitting: Neurology

## 2014-05-25 ENCOUNTER — Other Ambulatory Visit: Payer: Self-pay | Admitting: Family Medicine

## 2014-05-25 MED ORDER — TOPIRAMATE 25 MG PO TABS: ORAL_TABLET | ORAL | Status: DC

## 2014-06-15 ENCOUNTER — Encounter: Payer: Self-pay | Admitting: Family

## 2014-06-15 ENCOUNTER — Ambulatory Visit (INDEPENDENT_AMBULATORY_CARE_PROVIDER_SITE_OTHER): Payer: BC Managed Care – PPO | Admitting: Family

## 2014-06-15 VITALS — BP 120/88 | HR 77 | Temp 97.9°F | Wt 164.0 lb

## 2014-06-15 DIAGNOSIS — E7800 Pure hypercholesterolemia, unspecified: Secondary | ICD-10-CM

## 2014-06-15 DIAGNOSIS — Z9884 Bariatric surgery status: Secondary | ICD-10-CM

## 2014-06-15 NOTE — Progress Notes (Signed)
Subjective:    Patient ID: Anna Navarro, female    DOB: [birth year], 59 y.o.   MRN: [id number]  HPI 62 yr, Caucasian female, seen in office today for a 6 month f/u regarding elevated lipid panel. Denies any health issues.  Primary concern is her weight management. She advises that she exercises regularly and eats small portioned meals. With her history of bariatric surgery, she's concerned with the sudden weight gain and requests a consult with Bariatric RN.   Review of Systems  All other systems reviewed and are negative.     Past Medical History  Diagnosis Date  . ALLERGIC RHINITIS 02/03/2007  . 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 DEPRESSION 11/12/2006  . 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 GERD 11/12/2006  . HYPERLIPIDEMIA 11/12/2006  . 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 LOW BACK PAIN 02/03/2007  . NECK MASS 01/03/2007  . WARTS, HAND 01/07/2010  . WEIGHT LOSS, RECENT 02/03/2007    History   Social History  . Marital Status: Married    Spouse Name: N/A    Number of Children: N/A  . Years of Education: N/A   Occupational History  . Not on file.   Social History Main Topics  . Smoking status: Never Smoker   . Smokeless tobacco: Never Used  . Alcohol Use: 1.2 oz/week    2 Glasses of wine per week     Comment: once every two weeks   . Drug Use: No  . Sexual Activity: Yes    Birth Control/ Protection: Surgical   Other Topics Concern  . Not on file   Social History Narrative    Past Surgical History  Procedure Laterality Date  . Gastric bypass    . Total abdominal hysterectomy    . Breast lumpectomy with axillary lymph node biopsy Right 08/2007    RT, ER/PR+, T1N0    Family History  Problem Relation Age of Onset  . Heart attack Father   . Lung cancer Paternal Grandmother   . Colon cancer Paternal Grandmother     No Known Allergies  Current Outpatient Prescriptions on File Prior  to Visit  Medication Sig Dispense Refill  . alendronate (FOSAMAX) 70 MG tablet Take 1 tablet (70 mg total) by mouth every 7 (seven) days. Take with a full glass of water on an empty stomach. 4 tablet 5  . Cholecalciferol (VITAMIN D3) 5000 UNITS TABS Take 1 capsule by mouth daily. 30 tablet   . desvenlafaxine (PRISTIQ) 50 MG 24 hr tablet Take 1 tablet (50 mg total) by mouth daily. 90 tablet 0  . gabapentin (NEURONTIN) 100 MG capsule TAKE 1 CAPSULE BY MOUTH THREE TIMES DAILY (Patient taking differently: take 1 capsule at night) 90 capsule 5  . magnesium 30 MG tablet Take 30 mg by mouth daily.    Marland Kitchen omeprazole (PRILOSEC) 20 MG capsule TAKE ONE CAPSULE BY MOUTH EVERY DAY 90 capsule 1  . Riboflavin (B-2 PO) Take by mouth daily.    Marland Kitchen topiramate (TOPAMAX) 25 MG tablet Take 2 tablets daily 60 tablet 4  . vitamin B-12 (CYANOCOBALAMIN) 250 MCG tablet Take 250 mcg by mouth daily.     No current facility-administered medications on file prior to visit.    BP 120/88 mmHg  Pulse 77  Temp(Src) 97.9 F (36.6 C) (Oral)  Wt 164 lb (74.39 kg)chart     Objective:   Physical Exam  Constitutional: She is oriented to person,  place, and time.  HENT:  Head: Normocephalic.  Eyes: Pupils are equal, round, and reactive to light.  Neck: Normal range of motion.  Cardiovascular: Normal rate and regular rhythm.   Pulmonary/Chest: Effort normal and breath sounds normal.  Abdominal: Soft. Bowel sounds are normal.  Musculoskeletal: Normal range of motion.  Neurological: She is alert and oriented to person, place, and time. She has normal reflexes.  Skin: Skin is warm.  Psychiatric: She has a normal mood and affect. Her behavior is normal. Judgment and thought content normal.          Assessment & Plan:  [name] was seen today for follow-up.  Diagnoses and associated orders for this visit:  Pure hypercholesterolemia - Lipid Panel; Future - Consult to bariatric RN  History of bariatric surgery - Consult  to bariatric RN    Patient to schedule a fasting lab appointment to have lipids rechecked.  Will follow-up with lab results.  Provided cardiac diet education and referral made to bariatric RN for weight management  Consultation.

## 2014-06-15 NOTE — Progress Notes (Signed)
Pre visit review using our clinic review tool, if applicable. No additional management support is needed unless otherwise documented below in the visit note. 

## 2014-06-15 NOTE — Patient Instructions (Signed)
Cardiac Diet This diet can help prevent heart disease and stroke. Many factors influence your heart health, including eating and exercise habits. Coronary risk rises a lot with abnormal blood fat (lipid) levels. Cardiac meal planning includes limiting unhealthy fats, increasing healthy fats, and making other small dietary changes. General guidelines are as follows:  Adjust calorie intake to reach and maintain desirable body weight.  Limit total fat intake to less than 30% of total calories. Saturated fat should be less than 7% of calories.  Saturated fats are found in animal products and in some vegetable products. Saturated vegetable fats are found in coconut oil, cocoa butter, palm oil, and palm kernel oil. Read labels carefully to avoid these products as much as possible. Use butter in moderation. Choose tub margarines and oils that have 2 grams of fat or less. Good cooking oils are canola and olive oils.  Practice low-fat cooking techniques. Do not fry food. Instead, broil, bake, boil, steam, grill, roast on a rack, stir-fry, or microwave it. Other fat reducing suggestions include:  Remove the skin from poultry.  Remove all visible fat from meats.  Skim the fat off stews, soups, and gravies before serving them.  Steam vegetables in water or broth instead of sauting them in fat.  Avoid foods with trans fat (or hydrogenated oils), such as commercially fried foods and commercially baked goods. Commercial shortening and deep-frying fats will contain trans fat.  Increase intake of fruits, vegetables, whole grains, and legumes to replace foods high in fat.  Increase consumption of nuts, legumes, and seeds to at least 4 servings weekly. One serving of a legume equals  cup, and 1 serving of nuts or seeds equals  cup.  Choose whole grains more often. Have 3 servings per day (a serving is 1 ounce [oz]).  Eat 4 to 5 servings of vegetables per day. A serving of vegetables is 1 cup of raw leafy  vegetables;  cup of raw or cooked cut-up vegetables;  cup of vegetable juice.  Eat 4 to 5 servings of fruit per day. A serving of fruit is 1 medium whole fruit;  cup of dried fruit;  cup of fresh, frozen, or canned fruit;  cup of 100% fruit juice.  Increase your intake of dietary fiber to 20 to 30 grams per day. Insoluble fiber may help lower your risk of heart disease and may help curb your appetite.  Soluble fiber binds cholesterol to be removed from the blood. Foods high in soluble fiber are dried beans, citrus fruits, oats, apples, bananas, broccoli, Brussels sprouts, and eggplant.  Try to include foods fortified with plant sterols or stanols, such as yogurt, breads, juices, or margarines. Choose several fortified foods to achieve a daily intake of 2 to 3 grams of plant sterols or stanols.  Foods with omega-3 fats can help reduce your risk of heart disease. Aim to have a 3.5 oz portion of fatty fish twice per week, such as salmon, mackerel, albacore tuna, sardines, lake trout, or herring. If you wish to take a fish oil supplement, choose one that contains 1 gram of both DHA and EPA.  Limit processed meats to 2 servings (3 oz portion) weekly.  Limit the sodium in your diet to 1500 milligrams (mg) per day. If you have high blood pressure, talk to a registered dietitian about a DASH (Dietary Approaches to Stop Hypertension) eating plan.  Limit sweets and beverages with added sugar, such as soda, to no more than 5 servings per week. One   serving is:   1 tablespoon sugar.  1 tablespoon jelly or jam.   cup sorbet.  1 cup lemonade.   cup regular soda. CHOOSING FOODS Starches  Allowed: Breads: All kinds (wheat, rye, raisin, white, oatmeal, Italian, French, and English muffin bread). Low-fat rolls: English muffins, frankfurter and hamburger buns, bagels, pita bread, tortillas (not fried). Pancakes, waffles, biscuits, and muffins made with recommended oil.  Avoid: Products made with  saturated or trans fats, oils, or whole milk products. Butter rolls, cheese breads, croissants. Commercial doughnuts, muffins, sweet rolls, biscuits, waffles, pancakes, store-bought mixes. Crackers  Allowed: Low-fat crackers and snacks: Animal, graham, rye, saltine (with recommended oil, no lard), oyster, and matzo crackers. Bread sticks, melba toast, rusks, flatbread, pretzels, and light popcorn.  Avoid: High-fat crackers: cheese crackers, butter crackers, and those made with coconut, palm oil, or trans fat (hydrogenated oils). Buttered popcorn. Cereals  Allowed: Hot or cold whole-grain cereals.  Avoid: Cereals containing coconut, hydrogenated vegetable fat, or animal fat. Potatoes / Pasta / Rice  Allowed: All kinds of potatoes, rice, and pasta (such as macaroni, spaghetti, and noodles).  Avoid: Pasta or rice prepared with cream sauce or high-fat cheese. Chow mein noodles, French fries. Vegetables  Allowed: All vegetables and vegetable juices.  Avoid: Fried vegetables. Vegetables in cream, butter, or high-fat cheese sauces. Limit coconut. Fruit in cream or custard. Protein  Allowed: Limit your intake of meat, seafood, and poultry to no more than 6 oz (cooked weight) per day. All lean, well-trimmed beef, veal, pork, and lamb. All chicken and turkey without skin. All fish and shellfish. Wild game: wild duck, rabbit, pheasant, and venison. Egg whites or low-cholesterol egg substitutes may be used as desired. Meatless dishes: recipes with dried beans, peas, lentils, and tofu (soybean curd). Seeds and nuts: all seeds and most nuts.  Avoid: Prime grade and other heavily marbled and fatty meats, such as short ribs, spare ribs, rib eye roast or steak, frankfurters, sausage, bacon, and high-fat luncheon meats, mutton. Caviar. Commercially fried fish. Domestic duck, goose, venison sausage. Organ meats: liver, gizzard, heart, chitterlings, brains, kidney, sweetbreads. Dairy  Allowed: Low-fat  cheeses: nonfat or low-fat cottage cheese (1% or 2% fat), cheeses made with part skim milk, such as mozzarella, farmers, string, or ricotta. (Cheeses should be labeled no more than 2 to 6 grams fat per oz.). Skim (or 1%) milk: liquid, powdered, or evaporated. Buttermilk made with low-fat milk. Drinks made with skim or low-fat milk or cocoa. Chocolate milk or cocoa made with skim or low-fat (1%) milk. Nonfat or low-fat yogurt.  Avoid: Whole milk cheeses, including colby, cheddar, muenster, Monterey Jack, Havarti, Brie, Camembert, American, Swiss, and blue. Creamed cottage cheese, cream cheese. Whole milk and whole milk products, including buttermilk or yogurt made from whole milk, drinks made from whole milk. Condensed milk, evaporated whole milk, and 2% milk. Soups and Combination Foods  Allowed: Low-fat low-sodium soups: broth, dehydrated soups, homemade broth, soups with the fat removed, homemade cream soups made with skim or low-fat milk. Low-fat spaghetti, lasagna, chili, and Spanish rice if low-fat ingredients and low-fat cooking techniques are used.  Avoid: Cream soups made with whole milk, cream, or high-fat cheese. All other soups. Desserts and Sweets  Allowed: Sherbet, fruit ices, gelatins, meringues, and angel food cake. Homemade desserts with recommended fats, oils, and milk products. Jam, jelly, honey, marmalade, sugars, and syrups. Pure sugar candy, such as gum drops, hard candy, jelly beans, marshmallows, mints, and small amounts of dark chocolate.  Avoid: Commercially prepared   cakes, pies, cookies, frosting, pudding, or mixes for these products. Desserts containing whole milk products, chocolate, coconut, lard, palm oil, or palm kernel oil. Ice cream or ice cream drinks. Candy that contains chocolate, coconut, butter, hydrogenated fat, or unknown ingredients. Buttered syrups. Fats and Oils  Allowed: Vegetable oils: safflower, sunflower, corn, soybean, cottonseed, sesame, canola, olive,  or peanut. Non-hydrogenated margarines. Salad dressing or mayonnaise: homemade or commercial, made with a recommended oil. Low or nonfat salad dressing or mayonnaise.  Limit added fats and oils to 6 to 8 tsp per day (includes fats used in cooking, baking, salads, and spreads on bread). Remember to count the "hidden fats" in foods.  Avoid: Solid fats and shortenings: butter, lard, salt pork, bacon drippings. Gravy containing meat fat, shortening, or suet. Cocoa butter, coconut. Coconut oil, palm oil, palm kernel oil, or hydrogenated oils: these ingredients are often used in bakery products, nondairy creamers, whipped toppings, candy, and commercially fried foods. Read labels carefully. Salad dressings made of unknown oils, sour cream, or cheese, such as blue cheese and Roquefort. Cream, all kinds: half-and-half, light, heavy, or whipping. Sour cream or cream cheese (even if "light" or low-fat). Nondairy cream substitutes: coffee creamers and sour cream substitutes made with palm, palm kernel, hydrogenated oils, or coconut oil. Beverages  Allowed: Coffee (regular or decaffeinated), tea. Diet carbonated beverages, mineral water. Alcohol: Check with your caregiver. Moderation is recommended.  Avoid: Whole milk, regular sodas, and juice drinks with added sugar. Condiments  Allowed: All seasonings and condiments. Cocoa powder. "Cream" sauces made with recommended ingredients.  Avoid: Carob powder made with hydrogenated fats. SAMPLE MENU Breakfast   cup orange juice   cup oatmeal  1 slice toast  1 tsp margarine  1 cup skim milk Lunch  Turkey sandwich with 2 oz turkey, 2 slices bread  Lettuce and tomato slices  Fresh fruit  Carrot sticks  Coffee or tea Snack  Fresh fruit or low-fat crackers Dinner  3 oz lean ground beef  1 baked potato  1 tsp margarine   cup asparagus  Lettuce salad  1 tbs non-creamy dressing   cup peach slices  1 cup skim milk Document Released:  02/11/2008 Document Revised: 11/03/2011 Document Reviewed: 07/04/2013 [employer] Patient Information 2015 [employer]. This information is not intended to replace advice given to you by your health care provider. Make sure you discuss any questions you have with your health care provider.  

## 2014-07-17 ENCOUNTER — Other Ambulatory Visit: Payer: Self-pay | Admitting: Gynecology

## 2014-07-17 ENCOUNTER — Other Ambulatory Visit: Payer: Self-pay | Admitting: Family

## 2014-07-17 DIAGNOSIS — Z853 Personal history of malignant neoplasm of breast: Secondary | ICD-10-CM

## 2014-07-18 ENCOUNTER — Ambulatory Visit: Payer: BC Managed Care – PPO | Admitting: Neurology

## 2014-07-25 ENCOUNTER — Other Ambulatory Visit: Payer: Self-pay | Admitting: Family

## 2014-08-10 ENCOUNTER — Other Ambulatory Visit: Payer: BC Managed Care – PPO

## 2014-08-13 ENCOUNTER — Other Ambulatory Visit (INDEPENDENT_AMBULATORY_CARE_PROVIDER_SITE_OTHER): Payer: BC Managed Care – PPO

## 2014-08-13 DIAGNOSIS — E7800 Pure hypercholesterolemia, unspecified: Secondary | ICD-10-CM

## 2014-08-13 LAB — LIPID PANEL
Cholesterol: 191 mg/dL (ref 0–200)
HDL: 61 mg/dL (ref >39.00)
LDL Cholesterol: 102 mg/dL — ABNORMAL HIGH (ref 0–99)
NonHDL: 130
Total CHOL/HDL Ratio: 3
Triglycerides: 142 mg/dL (ref 0.0–149.0)
VLDL: 28.4 mg/dL (ref 0.0–40.0)

## 2014-08-22 ENCOUNTER — Ambulatory Visit
Admission: RE | Admit: 2014-08-22 | Discharge: 2014-08-22 | Disposition: A | Discharge status: Home / Self Care | Payer: BC Managed Care – PPO | Source: Ambulatory Visit | Attending: Family | Admitting: Family

## 2014-08-22 DIAGNOSIS — Z853 Personal history of malignant neoplasm of breast: Secondary | ICD-10-CM

## 2014-09-06 DIAGNOSIS — M4317 Spondylolisthesis, lumbosacral region: Secondary | ICD-10-CM | POA: Insufficient documentation

## 2014-09-17 ENCOUNTER — Telehealth: Payer: Self-pay | Admitting: *Deleted

## 2014-09-17 NOTE — Telephone Encounter (Signed)
Patient called again asking for Dr. Naaman Plummer to call or face to face 5 or a phone call.  I asked her what it was about and she said that you would know. When I pressed her for more information she was discussed because she call on Friday as well.  Please let me know how you would like me to proceed. Thanks

## 2014-09-17 NOTE — Telephone Encounter (Signed)
Who is this patient?

## 2014-09-18 NOTE — Telephone Encounter (Signed)
This was entered in error

## 2014-09-25 ENCOUNTER — Ambulatory Visit (INDEPENDENT_AMBULATORY_CARE_PROVIDER_SITE_OTHER): Payer: BC Managed Care – PPO | Admitting: Internal Medicine

## 2014-09-25 ENCOUNTER — Encounter: Payer: Self-pay | Admitting: Internal Medicine

## 2014-09-25 VITALS — BP 116/76 | Temp 98.5°F | Ht 60.5 in | Wt 159.0 lb

## 2014-09-25 DIAGNOSIS — R300 Dysuria: Secondary | ICD-10-CM

## 2014-09-25 DIAGNOSIS — R3915 Urgency of urination: Secondary | ICD-10-CM | POA: Diagnosis not present

## 2014-09-25 DIAGNOSIS — N390 Urinary tract infection, site not specified: Secondary | ICD-10-CM

## 2014-09-25 LAB — POCT URINALYSIS DIPSTICK
Bilirubin, UA: NEGATIVE
Blood, UA: 3
Glucose, UA: NEGATIVE
Ketones, UA: NEGATIVE
Nitrite, UA: POSITIVE
Protein, UA: 2
Spec Grav, UA: 1.015
Urobilinogen, UA: 1
pH, UA: 6

## 2014-09-25 MED ORDER — CEPHALEXIN 500 MG PO CAPS: 500.0000 mg | ORAL_CAPSULE | Freq: Three times a day (TID) | ORAL | Status: DC

## 2014-09-25 NOTE — Patient Instructions (Signed)
This is an uncomplicated UTI  Take antibiotic and if not better at end of med contact us. Will notify you  of  Culture  when available.

## 2014-09-25 NOTE — Progress Notes (Signed)
Pre visit review using our clinic review tool, if applicable. No additional management support is needed unless otherwise documented below in the visit note.  Chief Complaint  Patient presents with  . Urinary Urgency    Started this morning.  . Dysuria  . Abdominal Pressure    HPI: Patient Anna Navarro  comes in today for SDA for  new problem evaluation. One day of dysuria pressure  No hematuria  No fever chills   Hx of uti    Was on suppression in the past for ayear but non for a while last  uti 11 15 klebsiella. Tried azo this am helps some  [employer]acher [employer]   ROS: See pertinent positives and negatives per HPI.no henaturia  Past Medical History  Diagnosis Date  . ALLERGIC RHINITIS 02/03/2007  . 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 DEPRESSION 11/12/2006  . 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 GERD 11/12/2006  . HYPERLIPIDEMIA 11/12/2006  . 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 LOW BACK PAIN 02/03/2007  . NECK MASS 01/03/2007  . WARTS, HAND 01/07/2010  . WEIGHT LOSS, RECENT 02/03/2007    Family History  Problem Relation Age of Onset  . Heart attack Father   . Lung cancer Paternal Grandmother   . Colon cancer Paternal Grandmother     History   Social History  . Marital Status: Married    Spouse Name: N/A  . Number of Children: N/A  . Years of Education: N/A   Social History Main Topics  . Smoking status: Never Smoker   . Smokeless tobacco: Never Used  . Alcohol Use: 1.2 oz/week    2 Glasses of wine per week     Comment: once every two weeks   . Drug Use: No  . Sexual Activity: Yes    Birth Control/ Protection: Surgical   Other Topics Concern  . None   Social History Narrative    Outpatient Prescriptions Prior to Visit  Medication Sig Dispense Refill  . alendronate (FOSAMAX) 70 MG tablet Take 1 tablet (70 mg total) by mouth every 7 (seven) days. Take with a full glass of  water on an empty stomach. 4 tablet 5  . Cholecalciferol (VITAMIN D3) 5000 UNITS TABS Take 1 capsule by mouth daily. 30 tablet   . gabapentin (NEURONTIN) 100 MG capsule TAKE 1 CAPSULE BY MOUTH THREE TIMES DAILY (Patient taking differently: take 1 capsule at night) 90 capsule 5  . omeprazole (PRILOSEC) 20 MG capsule TAKE ONE CAPSULE BY MOUTH EVERY DAY 90 capsule 1  . PRISTIQ 50 MG 24 hr tablet TAKE 1 TABLET BY MOUTH DAILY 90 tablet 0  . topiramate (TOPAMAX) 25 MG tablet Take 2 tablets daily 60 tablet 4  . vitamin B-12 (CYANOCOBALAMIN) 250 MCG tablet Take 250 mcg by mouth daily.    . magnesium 30 MG tablet Take 30 mg by mouth daily.    . Riboflavin (B-2 PO) Take by mouth daily.     No facility-administered medications prior to visit.     EXAM:  BP 116/76 mmHg  Temp(Src) 98.5 F (36.9 C) (Oral)  Ht 5' 0.5" (1.537 m)  Wt 159 lb (72.122 kg)  BMI 30.53 kg/m2  Body mass index is 30.53 kg/(m^2).  GENERAL: vitals reviewed and listed above, alert, oriented, appears well hydrated and in no acute distress HEENT: atraumatic, conjunctiva  clear, no obvious abnormalities on inspection of external nose and ears  Abdomen:  Sof,t normal  bowel sounds without hepatosplenomegaly, no guarding rebound or masses no CVA tenderness MS: moves all extremities without noticeable focal  abnormality PSYCH: pleasant and cooperative, no obvious depression or anxiety ua 3 + leuk pos nitrites  Pos kleuk  ASSESSMENT AND PLAN:  Discussed the following assessment and plan:  Urinary tract infection without hematuria, site unspecified - Plan: Urine culture  Dysuria - Plan: POC Urinalysis Dipstick  Urinary urgency - Plan: POC Urinalysis Dipstick   Expectant management. And fu  -Patient advised to return or notify health care team  if symptoms worsen ,persist or new concerns arise.  Patient Instructions  This is an uncomplicated UTI  Take antibiotic and if not better at end of med contact us. Will notify you   of  Culture  when available.    Standley Brooking. Panosh M.D.

## 2014-09-28 LAB — URINE CULTURE: Colony Count: >=100000

## 2014-09-28 NOTE — Progress Notes (Signed)
Quick Note:  Tell patient that urine culture shows e coli sensitive to medication given . Should resolve with current treatment .FU if not better. ______ 

## 2014-10-26 ENCOUNTER — Encounter: Payer: Self-pay | Admitting: Family Medicine

## 2014-10-26 ENCOUNTER — Ambulatory Visit (INDEPENDENT_AMBULATORY_CARE_PROVIDER_SITE_OTHER): Payer: BC Managed Care – PPO | Admitting: Family Medicine

## 2014-10-26 DIAGNOSIS — R300 Dysuria: Secondary | ICD-10-CM

## 2014-10-26 LAB — POCT URINALYSIS DIPSTICK
Bilirubin, UA: NEGATIVE
Glucose, UA: NEGATIVE
Ketones, UA: NEGATIVE
Nitrite, UA: NEGATIVE
Protein, UA: NEGATIVE
Spec Grav, UA: >=1.03
Urobilinogen, UA: 0.2
pH, UA: 5.5

## 2014-10-26 MED ORDER — CIPROFLOXACIN HCL 500 MG PO TABS: 500.0000 mg | ORAL_TABLET | Freq: Two times a day (BID) | ORAL | Status: DC

## 2014-10-26 NOTE — Progress Notes (Signed)
   Subjective:    Patient ID: Anna Navarro, female    DOB: 1955-11-30, 59 y.o.   MRN: [id number]  HPI   Patient seen with one-week history of intermittent burning with urination. She was treated just a month ago for Escherichia coli with Keflex and did improved following that. No fevers or chills. No nausea or vomiting. No flank pain. No abdominal pain.   Review of Systems  Constitutional: Negative for fever, chills and appetite change.  Gastrointestinal: Negative for nausea, vomiting, abdominal pain, diarrhea and constipation.  Genitourinary: Positive for dysuria and frequency.  Musculoskeletal: Negative for back pain.  Neurological: Negative for dizziness.       Objective:   Physical Exam  Constitutional: She appears well-developed and well-nourished.  Cardiovascular: Normal rate and regular rhythm.   Pulmonary/Chest: Effort normal and breath sounds normal. No respiratory distress. She has no wheezes. She has no rales.          Assessment & Plan:  Recurrent dysuria. Suspect recurrent uncomplicated cystitis. Urine culture sent. Cipro 500 milligrams twice a day pending culture results

## 2014-10-26 NOTE — Progress Notes (Signed)
Pre visit review using our clinic review tool, if applicable. No additional management support is needed unless otherwise documented below in the visit note. 

## 2014-10-26 NOTE — Patient Instructions (Signed)

## 2014-10-28 ENCOUNTER — Encounter: Payer: Self-pay | Admitting: Family Medicine

## 2014-10-28 LAB — URINE CULTURE
Colony Count: NO GROWTH
Organism ID, Bacteria: NO GROWTH

## 2014-10-29 ENCOUNTER — Encounter: Payer: Self-pay | Admitting: Neurology

## 2014-10-29 ENCOUNTER — Ambulatory Visit (INDEPENDENT_AMBULATORY_CARE_PROVIDER_SITE_OTHER): Payer: BC Managed Care – PPO | Admitting: Neurology

## 2014-10-29 VITALS — BP 126/80 | HR 80 | Resp 20 | Ht 60.0 in | Wt 158.1 lb

## 2014-10-29 DIAGNOSIS — G43809 Other migraine, not intractable, without status migrainosus: Secondary | ICD-10-CM | POA: Insufficient documentation

## 2014-10-29 DIAGNOSIS — G43109 Migraine with aura, not intractable, without status migrainosus: Secondary | ICD-10-CM

## 2014-10-29 MED ORDER — TOPIRAMATE 50 MG PO TABS: 50.0000 mg | ORAL_TABLET | Freq: Two times a day (BID) | ORAL | Status: DC

## 2014-10-29 MED ORDER — TOPIRAMATE 50 MG PO TABS: 50.0000 mg | ORAL_TABLET | Freq: Every day | ORAL | Status: DC

## 2014-10-29 NOTE — Progress Notes (Signed)
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  UPDATE: She is taking topiramate 50mg  at bedtime.  She has been doing very well.  She has had no more than 3 or 4 spells in the past 6 months and they are manageable.  HISTORY: She began having recurrent episodes of dizziness in March.  She describes it as a sensation of movement, like during a drop on a roller coaster, as well as spinning.  It occurs spontaneously and at rest, but can be exacerbated with movement.  Sometimes it is so severe, she was unable to drive and needed her husband to come and pick her up.  She may feel a little queasy, but she denies any significant nausea and no vomiting.  She does endorse photophobia, phonophobia and osmophobia.  She denies lightheadedness, vision loss, focal numbness or weakness, or slurred speech. It is associated with a dull, 6/10 non-throbbing holocephalic ache.  There are no known triggers.  Only rest and sleep help.  It typically lasts 2-3 days and occurred once a week to once every other week.  However, she has been symptom-free for the past month.  She has taken meclizine, which is not too effective.  Even though the spells are episodic, she didn't quite feel right in between spells.  She was evaluated by Dr. Thornell Mule at the [facility].  An ENG showed central findings.  She had an MRI of the brain with and without contrast, which was reportedly normal.  She denies preceding viral illness or head trauma.  She denies prior history of migraine.  She denies family history of migraine.  PAST MEDICAL HISTORY: Past Medical History  Diagnosis Date  . ALLERGIC RHINITIS 02/03/2007  . 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  DEPRESSION 11/12/2006  . 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 GERD 11/12/2006  . HYPERLIPIDEMIA 11/12/2006  . 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 LOW BACK PAIN 02/03/2007  . NECK MASS 01/03/2007  . WARTS, HAND 01/07/2010  . WEIGHT LOSS, RECENT 02/03/2007    MEDICATIONS: Current Outpatient Prescriptions on File Prior to Visit  Medication Sig Dispense Refill  . alendronate (FOSAMAX) 70 MG tablet Take 1 tablet (70 mg total) by mouth every 7 (seven) days. Take with a full glass of water on an empty stomach. 4 tablet 5  . Cholecalciferol (VITAMIN D3) 5000 UNITS TABS Take 1 capsule by mouth daily. 30 tablet   . ciprofloxacin (CIPRO) 500 MG tablet Take 1 tablet (500 mg total) by mouth 2 (two) times daily. 14 tablet 0  . gabapentin (NEURONTIN) 100 MG capsule TAKE 1 CAPSULE BY MOUTH THREE TIMES DAILY (Patient taking differently: take 1 capsule at night) 90 capsule 5  . omeprazole (PRILOSEC) 20 MG capsule TAKE ONE CAPSULE BY MOUTH EVERY DAY 90 capsule 1  . PRISTIQ 50 MG 24 hr tablet TAKE 1 TABLET BY MOUTH DAILY 90 tablet 0  . vitamin B-12 (CYANOCOBALAMIN) 250 MCG tablet Take 250 mcg by mouth daily.     No current facility-administered medications on file prior to visit.    ALLERGIES: No Known Allergies  FAMILY HISTORY: Family History  Problem Relation Age of Onset  . Heart attack Father   . Lung cancer Paternal Grandmother   .  Colon cancer Paternal Grandmother     SOCIAL HISTORY: History   Social History  . Marital Status: Married    Spouse Name: N/A  . Number of Children: N/A  . Years of Education: N/A   Occupational History  . Not on file.   Social History Main Topics  . Smoking status: Never Smoker   . Smokeless tobacco: Never Used  . Alcohol Use: 1.2 oz/week    2 Glasses of wine per week     Comment: once every two weeks   . Drug Use: No  . Sexual Activity:    Partners: Male    Birth Control/ Protection: Surgical   Other Topics  Concern  . Not on file   Social History Narrative    REVIEW OF SYSTEMS: Constitutional: No fevers, chills, or sweats, no generalized fatigue, change in appetite Eyes: No visual changes, double vision, eye pain Ear, nose and throat: No hearing loss, ear pain, nasal congestion, sore throat Cardiovascular: No chest pain, palpitations Respiratory:  No shortness of breath at rest or with exertion, wheezes GastrointestinaI: No nausea, vomiting, diarrhea, abdominal pain, fecal incontinence Genitourinary:  No dysuria, urinary retention or frequency Musculoskeletal:  No neck pain, back pain Integumentary: No rash, pruritus, skin lesions Neurological: as above Psychiatric: No depression, insomnia, anxiety Endocrine: No palpitations, fatigue, diaphoresis, mood swings, change in appetite, change in weight, increased thirst Hematologic/Lymphatic:  No anemia, purpura, petechiae. Allergic/Immunologic: no itchy/runny eyes, nasal congestion, recent allergic reactions, rashes  PHYSICAL EXAM: Filed Vitals:   10/29/14 1352  BP: 126/80  Pulse: 80  Resp: 20   General: No acute distress Head:  Normocephalic/atraumatic Eyes:  Fundoscopic exam unremarkable without vessel changes, exudates, hemorrhages or papilledema. Neck: supple, no paraspinal tenderness, full range of motion Heart:  Regular rate and rhythm Lungs:  Clear to auscultation bilaterally Back: No paraspinal tenderness Neurological Exam: alert and oriented to person, place, and time. Attention span and concentration intact, recent and remote memory intact, fund of knowledge intact.  Speech fluent and not dysarthric, language intact.  CN II-XII intact. Fundoscopic exam unremarkable without vessel changes, exudates, hemorrhages or papilledema.  Bulk and tone normal, muscle strength 5/5 throughout.  Sensation to light touch, temperature and vibration intact.  Deep tendon reflexes 2+ throughout, toes downgoing.  Finger to nose and heel to shin  testing intact.  Gait normal, Romberg negative.  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  15 minutes spent face to face with patient, over 50% spent discussing management.  Metta Clines, DO  CC:  Maurice Small, MD

## 2014-10-29 NOTE — Patient Instructions (Signed)
Continue topiramate 50mg  at bedtime Follow up in 6 months.

## 2014-11-02 ENCOUNTER — Encounter: Payer: Self-pay | Admitting: Family Medicine

## 2014-11-02 ENCOUNTER — Ambulatory Visit (INDEPENDENT_AMBULATORY_CARE_PROVIDER_SITE_OTHER): Payer: BC Managed Care – PPO | Admitting: Family Medicine

## 2014-11-02 VITALS — BP 112/80 | HR 96 | Temp 98.3°F | Ht 61.25 in | Wt 158.6 lb

## 2014-11-02 DIAGNOSIS — Z6830 Body mass index (BMI) 30.0-30.9, adult: Secondary | ICD-10-CM

## 2014-11-02 DIAGNOSIS — E785 Hyperlipidemia, unspecified: Secondary | ICD-10-CM

## 2014-11-02 DIAGNOSIS — J309 Allergic rhinitis, unspecified: Secondary | ICD-10-CM

## 2014-11-02 DIAGNOSIS — F39 Unspecified mood [affective] disorder: Secondary | ICD-10-CM

## 2014-11-02 DIAGNOSIS — M810 Age-related osteoporosis without current pathological fracture: Secondary | ICD-10-CM

## 2014-11-02 DIAGNOSIS — Z7189 Other specified counseling: Secondary | ICD-10-CM

## 2014-11-02 DIAGNOSIS — Z7689 Persons encountering health services in other specified circumstances: Secondary | ICD-10-CM

## 2014-11-02 MED ORDER — GABAPENTIN 100 MG PO CAPS: ORAL_CAPSULE | ORAL | Status: AC

## 2014-11-02 MED ORDER — OMEPRAZOLE 20 MG PO CPDR: 20.0000 mg | DELAYED_RELEASE_CAPSULE | Freq: Every day | ORAL | Status: DC

## 2014-11-02 MED ORDER — ALENDRONATE SODIUM 70 MG PO TABS
70.0000 mg | ORAL_TABLET | ORAL | Status: DC
Start: 2014-11-02 — End: 2015-05-01

## 2014-11-02 MED ORDER — DESVENLAFAXINE SUCCINATE ER 50 MG PO TB24: 50.0000 mg | ORAL_TABLET | Freq: Every day | ORAL | Status: AC

## 2014-11-02 NOTE — Patient Instructions (Signed)
BEFORE YOU LEAVE: -schedule physical in 3-4 months and come fasting  -We placed a referral for you as discussed for the bone density test. It usually takes about 1-2 weeks to process and schedule this referral. If you have not heard from [place] regarding this appointment in 2 weeks please contact our office.   We recommend the following healthy lifestyle measures: - eat a healthy diet consisting of lots of vegetables, fruits, beans, nuts, seeds, healthy meats such as white chicken and fish - avoid fried foods, fast food, processed foods, sodas, red meet and other fattening foods.  - get a least 150 minutes of aerobic exercise per week.

## 2014-11-02 NOTE — Progress Notes (Signed)
HPI:  Anna Navarro is here to establish care.  Last PCP and physical: sees Elveria Rising in gyn for gyn and breast health, on fosamax and other medications in the past for osteoporosis for > 5 years. Takes vit D3.  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 Tomi Likens in neurology -meds: topiramate, neurontin at night -per recent notes, reviewed, is doing well  DDD: -seeing Dr. Ellene Route -getting epidural injections  ROS negative for unless reported above: fevers, unintentional weight loss, hearing or vision loss, chest pain, palpitations, struggling to breath, hemoptysis, melena, hematochezia, hematuria, falls, loc, si, thoughts of self harm  Past Medical History  Diagnosis Date  . Depression 02/03/2007  . Breast cancer     s/p lumpectomy and radiation 2007  . Vestibular neuritis   . UTI (lower urinary tract infection) 11/12/2006  . Chronic eustachian tube dysfunction 05/23/2010  . GERD 11/12/2006  . Hyperlipemia 11/12/2006  . H/O iron deficiency anemia 05/09/2007    per pt from grastic bypass  . Low back pain 02/03/2007  . Neck mass 01/03/2007  . Warts 01/07/2010    hands  . Allergic rhinitis 02/03/2007  . Osteoporosis   . DDD (degenerative disc disease), lumbar     seeing Dr. Ellene Route    Past Surgical History  Procedure Laterality Date  . Gastric bypass    . Total abdominal hysterectomy    . Breast lumpectomy with axillary lymph node biopsy Right 08/2007    RT, ER/PR+, T1N0    Family History  Problem Relation Age of Onset  . Heart attack Father   . Lung cancer Paternal Grandmother   . Colon cancer  Paternal Grandmother     History   Social History  . Marital Status: Married    Spouse Name: N/A  . Number of Children: N/A  . Years of Education: N/A   Social History Main Topics  . Smoking status: Never Smoker   . Smokeless tobacco: Never Used  . Alcohol Use: 1.2 oz/week    2 Glasses of wine per week     Comment: once every two weeks   . Drug Use: No  . Sexual Activity:    Partners: Male    Birth Control/ Protection: Surgical   Other Topics Concern  . 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  alendronate (FOSAMAX) 70 MG tablet, Take 1 tablet (70 mg total) by mouth every 7 (seven) days. Take with a full glass of water on an empty stomach., Disp: 12 tablet, Rfl: 3 .  Cholecalciferol (VITAMIN D3) 5000 UNITS TABS, Take 1 capsule by mouth daily., Disp: 30 tablet, Rfl:  .  desvenlafaxine (PRISTIQ) 50 MG 24 hr tablet, Take 1 tablet (50 mg total) by mouth daily., Disp: 90 tablet, Rfl: 3 .  gabapentin (NEURONTIN) 100 MG capsule, take 1 capsule at  night, Disp: 90 capsule, Rfl: 3 .  omeprazole (PRILOSEC) 20 MG capsule, Take 1 capsule (20 mg total) by mouth daily., Disp: 90 capsule, Rfl: 3 .  topiramate (TOPAMAX) 50 MG tablet, Take 1 tablet (50 mg total) by mouth at bedtime., Disp: 30 tablet, Rfl: 6 .  vitamin B-12 (CYANOCOBALAMIN) 250 MCG tablet, Take 250 mcg by mouth daily., Disp: , Rfl:   EXAM:  Filed Vitals:   11/02/14 0809  BP: 112/80  Pulse: 96  Temp: 98.3 F (36.8 C)    Body mass index is 29.71 kg/(m^2).  GENERAL: vitals reviewed and listed above, alert, oriented, appears well hydrated and in no acute distress  HEENT: atraumatic, conjunttiva clear, no obvious abnormalities on inspection of external nose and ears  NECK: no  obvious masses on inspection  LUNGS: clear to auscultation bilaterally, no wheezes, rales or rhonchi, good air movement  CV: HRRR, no peripheral edema  MS: moves all extremities without noticeable abnormality  PSYCH: pleasant and cooperative, no obvious depression or anxiety  ASSESSMENT AND PLAN:  Discussed the following assessment and plan:  Depression  BMI 30.0-30.9,adult  Hyperlipemia  Allergic rhinitis, unspecified allergic rhinitis type  Osteoporosis - Plan: DG Bone Density -may have her see Dr. Cruzita Lederer depending on results of bone density test  Encounter to establish care  -We reviewed the PMH, PSH, FH, SH, Meds and Allergies. -We provided refills for any medications we will prescribe as needed. -We addressed current concerns per orders and patient instructions. -We have asked for records for pertinent exams, studies, vaccines and notes from previous providers. -We have advised patient to follow up per instructions below. -refilled medications   -Patient advised to return or notify a doctor immediately if symptoms worsen or persist or new concerns arise.  Patient Instructions  BEFORE YOU LEAVE: -schedule physical in 3-4 months and come fasting  -We placed a referral for you as discussed for the bone density test. It usually takes about 1-2 weeks to process and schedule this referral. If you have not heard from [place] regarding this appointment in 2 weeks please contact our office.   We recommend the following healthy lifestyle measures: - eat a healthy diet consisting of lots of vegetables, fruits, beans, nuts, seeds, healthy meats such as white chicken and fish - avoid fried foods, fast food, processed foods, sodas, red meet and other fattening foods.  - get a least 150 minutes of aerobic exercise per week.       Colin Benton R.

## 2014-11-02 NOTE — Progress Notes (Signed)
Pre visit review using our clinic review tool, if applicable. No additional management support is needed unless otherwise documented below in the visit note. 

## 2014-12-03 ENCOUNTER — Encounter: Payer: Self-pay | Admitting: Genetic Counselor

## 2014-12-06 ENCOUNTER — Telehealth: Payer: Self-pay | Admitting: Genetic Counselor

## 2014-12-06 NOTE — Telephone Encounter (Signed)
called patient to schedule f/u genetic testing for 08/08 @ 9 w/Karen Florene Glen

## 2014-12-24 ENCOUNTER — Encounter: Payer: Self-pay | Admitting: Genetic Counselor

## 2014-12-24 ENCOUNTER — Ambulatory Visit (HOSPITAL_BASED_OUTPATIENT_CLINIC_OR_DEPARTMENT_OTHER): Payer: BC Managed Care – PPO | Admitting: Genetic Counselor

## 2014-12-24 ENCOUNTER — Other Ambulatory Visit: Payer: BC Managed Care – PPO

## 2014-12-24 DIAGNOSIS — Z801 Family history of malignant neoplasm of trachea, bronchus and lung: Secondary | ICD-10-CM

## 2014-12-24 DIAGNOSIS — IMO0001 Reserved for inherently not codable concepts without codable children: Secondary | ICD-10-CM

## 2014-12-24 DIAGNOSIS — Z853 Personal history of malignant neoplasm of breast: Secondary | ICD-10-CM | POA: Diagnosis not present

## 2014-12-24 DIAGNOSIS — Z803 Family history of malignant neoplasm of breast: Secondary | ICD-10-CM

## 2014-12-24 DIAGNOSIS — Z315 Encounter for genetic counseling: Secondary | ICD-10-CM

## 2014-12-24 DIAGNOSIS — Z800 Family history of malignant neoplasm of digestive organs: Secondary | ICD-10-CM

## 2014-12-24 DIAGNOSIS — Z808 Family history of malignant neoplasm of other organs or systems: Secondary | ICD-10-CM

## 2014-12-24 NOTE — Progress Notes (Signed)
REFERRING PROVIDER: Lucretia Navarro., DO Navarro, KAREN, MS, CGC  PRIMARY PROVIDER:  Lucretia Navarro., DO  PRIMARY REASON FOR VISIT:  1. History of breast cancer   2. Family history of breast cancer in female   3. Family history of brain cancer   4. Family history of lung cancer   5. Ashkenazi Jewish ancestry      HISTORY OF PRESENT ILLNESS:   Ms. Anna Navarro, a 59 y.o. female, was first seen for Anna Anna Navarro consultation in 2009 and she is returning for Anna Navarro consultation with her husband following receipt of our recall letter regarding updated genetic testing options.  Ms. Anna Anna Navarro presents to clinic today to discuss the possibility of a hereditary predisposition to cancer, genetic testing, and to further clarify her future cancer risks, as well as potential cancer risks for family members.   In 2009, at the age of 57, Ms. Anna Navarro was diagnosed with invasive ductal carcinoma of the right breast.  Hormone receptor status was ER/PR+, Her2-.  This was treated with right lumpectomy, radiation, and femara. Ms. Anna Anna Navarro also has maternal and paternal Ashkenazi Jewish ancestry.   CANCER HISTORY:   No history exists.  2009 - IDC of right breast; ER/PR+, Her2-   HORMONAL RISK FACTORS:  Menarche was at age 29.  First live birth at age 57.  OCP use for approximately greater than 20  years.  Ovaries intact: partial oophorectomy.  Hysterectomy: yes.  Menopausal status: postmenopausal.  HRT use: 0 years. Colonoscopy: yes; normal. Mammogram within the last year: yes. Number of breast biopsies: 1. Up to date with pelvic exams:  yes. Any excessive radiation exposure in the past:  no  Past Medical History  Diagnosis Date  . Depression 02/03/2007  . Vestibular neuritis   . UTI (lower urinary tract infection) 11/12/2006  . Chronic eustachian tube dysfunction 05/23/2010  . GERD 11/12/2006  . Hyperlipemia 11/12/2006  . H/O iron deficiency anemia 05/09/2007    per pt from grastic  bypass  . Low back pain 02/03/2007  . Neck mass 01/03/2007  . Warts 01/07/2010    hands  . Allergic rhinitis 02/03/2007  . Osteoporosis   . DDD (degenerative disc disease), lumbar     seeing Dr. Ellene Navarro  . Breast cancer 2009    IDC of UOQ of right breast; ER/PR+, Her2-, Ki67 10%; s/p lumpectomy and radiation 2009    Past Surgical History  Procedure Laterality Date  . Gastric bypass    . Total abdominal hysterectomy    . Breast lumpectomy with axillary lymph node biopsy Right 08/2007    RT, ER/PR+, T1N0    History   Social History  . Marital Status: Married    Spouse Name: N/A  . Number of Children: N/A  . Years of Education: N/A   Social History Main Topics  . Smoking status: Never Smoker   . Smokeless tobacco: Never Used  . Alcohol Use: 1.2 oz/week    2 Glasses of wine per week     Comment: once every two weeks   . Drug Use: No  . Sexual Activity:    Partners: Male    Birth Control/ Protection: Surgical   Other Topics Concern  . 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  Significant diagnoses are listed below: Family History  Problem Relation Age of Onset  . Heart attack Father   . Other Mother     bilateral mastectomies at 45y due to precancerous tumors  . Breast cancer Maternal Aunt     dx. early 35s  . Heart Problems Maternal Aunt   . Lung cancer Maternal Uncle     dx. 62-62; former smoker  . Lung cancer Maternal Grandmother     dx. 20s; not a smoker; "metastasis to colon"  . Brain cancer Other     dx. >50s  . Cancer Other     dx. <50s; unknown cancer/dx. 70s; unknown type    Ms. Anna Navarro has two biological sons, ages 41 and 81.  She also has one adopted daughter, age 89.   All of her children are cancer-free.  She has no grandchildren currently.  She has one full brother who is cancer-free at the age of 49.  Ms. Anna Anna Navarro father passed away of a heart attack at the age of 21-26.  Her mother is alive and cancer-free at 34, but has a history of bilateral mastectomies in her mid-40s reportedly from precancerous "tumors".    There is a maternal family history of cancer.  Ms. Anna Navarro mother had two full siblings.  Her brother died of lung cancer at 38; he was a former smoker.  He has three children, none of whom have cancer.  Ms. Anna Anna Navarro sister died in her late 75s of heart problems.  She was diagnosed with breast cancer in her early 10s.  This aunt has three sons and two daughters who are currently in their 44s-60s and who have not had cancer.  Ms. Anna Navarro maternal grandfather had amyloidosis and passed away in his late 22s-early 80s.  Her grandmother was diagnosed with lung cancer which reportedly spread to her colon in her 30s.  She was not a smoker, and she passed away at 24.  Ms. Anna Anna Navarro was aware of three siblings of this grandmother--two great aunts and one great uncle.  The two great aunts were diagnosed with unknown cancers--one in her 33s.  The great uncle was diagnosed with a brain tumor at an age older than 82s.  Ms. Anna Anna Navarro father was an only child.  Ms. Anna Anna Navarro medical record stated that her paternal grandmother had colon cancer and her paternal grandfather had lung cancer, however, she is unaware of this history and actually was not sure how these grandparents passed or whether they ever had cancer.  She believes that her grandfather died in his 7s and her grandmother in her mid-4s.  It is likely that this cancer history was recorded erroneously in the past.  She is not aware of any other history of cancer in the family.  Patient's maternal and paternal ancestors are of European Polish/Ashkenazi Jewish descent. There is no known  consanguinity.  GENETIC COUNSELING ASSESSMENT: Anna Anna Navarro is a 59 y.o. female with a personal and family history of breast cancer which is somewhat suggestive of a hereditary breast cancer syndrome and predisposition to cancer. We, therefore, discussed and recommended the following at today's visit.   DISCUSSION: We reviewed the characteristics, features and inheritance patterns of hereditary cancer syndromes, particularly those caused by mutations within BRCA1/2 and other genes associated with increased risks for breast cancer. We also discussed genetic testing, including the appropriate family members  to test, the process of testing, insurance coverage and turn-around-time for results. We discussed the implications of a negative, positive and/or variant of uncertain significant result. We discussed panel testing options including larger and smaller panels and the benefits of both.  Ms. Andringa is interested in smaller panel testing.  She will reach out to her mother to determine whether there really is no colon cancer or other additional cancers in the family that we should be concerned about.  In the event there are no additional cancers, we recommended Ms. Rami pursue genetic testing for the 8-gene BRCAplus-Expanded Panel [facility] [place]  The BRCAplus-Expanded Panel offered by [employer] includes sequencing and deletion/duplication analysis for the following 8 genes: ATM, BRCA1, BRCA2, CDH1, CHEK2, PALB2, PTEN, and TP53.  panel.   Based on Ms. Retzloff's personal and family history of cancer, she meets medical criteria for genetic testing. Despite that she meets criteria, she may still have an out of pocket cost. We discussed that if her out of pocket cost for testing is over $100, the laboratory will call and confirm whether she wants to proceed with testing.  If the out of pocket cost of testing is less than $100 she will be billed by the genetic  testing laboratory.   PLAN: After considering the risks, benefits, and limitations, Ms. Costabile  provided informed consent to pursue genetic testing and the blood sample was sent to [facility] for analysis of the 8-gene BRCAplus-Expanded Panel test. Results should be available within approximately 3 weeks' time, at which point they will be disclosed by telephone to Ms. Hugill, as will any additional recommendations warranted by these results. Ms. Fors will receive a summary of her genetic counseling visit and a copy of her results once available. This information will also be available in Epic. We encouraged Ms. Kishimoto to remain in contact with cancer Anna Navarro annually so that we can continuously update the family history and inform her of any changes in cancer Anna Navarro and testing that may be of benefit for her family. Ms. Goll questions were answered to her satisfaction today. Our contact information was provided should additional questions or concerns arise.  Thank you for the referral and allowing [place] to share in the care of your patient.   Anna Luz, MS Genetic Counselor [email or web address] Phone: [phone]  The patient was seen for a total of 60 minutes in face-to-face genetic counseling.  This patient was discussed with Drs. Magrinat, Lindi Adie and/or Burr Medico who agrees with the above.    _______________________________________________________________________ For Office Staff:  Number of people involved in session: 2 Was an Intern/ student involved with case: no

## 2015-01-18 ENCOUNTER — Telehealth: Payer: Self-pay | Admitting: Genetic Counselor

## 2015-01-18 ENCOUNTER — Ambulatory Visit: Payer: Self-pay | Admitting: Genetic Counselor

## 2015-01-18 DIAGNOSIS — Z1379 Encounter for other screening for genetic and chromosomal anomalies: Secondary | ICD-10-CM

## 2015-01-22 ENCOUNTER — Encounter: Payer: Self-pay | Admitting: Family Medicine

## 2015-01-22 ENCOUNTER — Ambulatory Visit (INDEPENDENT_AMBULATORY_CARE_PROVIDER_SITE_OTHER): Payer: BC Managed Care – PPO | Admitting: Family Medicine

## 2015-01-22 VITALS — BP 116/80 | HR 89 | Temp 97.9°F | Ht 61.25 in | Wt 160.3 lb

## 2015-01-22 DIAGNOSIS — R300 Dysuria: Secondary | ICD-10-CM

## 2015-01-22 LAB — POCT URINALYSIS DIPSTICK
Bilirubin, UA: NEGATIVE
Blood, UA: NEGATIVE
Glucose, UA: NEGATIVE
Ketones, UA: NEGATIVE
Nitrite, UA: NEGATIVE
Protein, UA: NEGATIVE
Spec Grav, UA: 1.025
Urobilinogen, UA: 0.2
pH, UA: 6

## 2015-01-22 NOTE — Progress Notes (Signed)
Pre visit review using our clinic review tool, if applicable. No additional management support is needed unless otherwise documented below in the visit note. 

## 2015-01-22 NOTE — Addendum Note (Signed)
Addended by: Agnes Lawrence on: 01/22/2015 04:01 PM   Modules accepted: Orders

## 2015-01-22 NOTE — Progress Notes (Signed)
HPI:  Acute visit for:  Dysuria: -started 2-3 days ago -symptoms: mild frequency and urgency -denies: fevers, NV, flank pain, hematuria, malaise -hx of recurrent UTI, followed by Urology, on prophylactic med in the past  ROS: See pertinent positives and negatives per HPI.  Past Medical History  Diagnosis Date  . Depression 02/03/2007  . Vestibular neuritis   . UTI (lower urinary tract infection) 11/12/2006  . Chronic eustachian tube dysfunction 05/23/2010  . GERD 11/12/2006  . Hyperlipemia 11/12/2006  . H/O iron deficiency anemia 05/09/2007    per pt from grastic bypass  . Low back pain 02/03/2007  . Neck mass 01/03/2007  . Warts 01/07/2010    hands  . Allergic rhinitis 02/03/2007  . Osteoporosis   . DDD (degenerative disc disease), lumbar     seeing Dr. Ellene Route  . Breast cancer 2009    IDC of UOQ of right breast; ER/PR+, Her2-, Ki67 10%; s/p lumpectomy and radiation 2009    Past Surgical History  Procedure Laterality Date  . Gastric bypass    . Total abdominal hysterectomy    . Breast lumpectomy with axillary lymph node biopsy Right 08/2007    RT, ER/PR+, T1N0    Family History  Problem Relation Age of Onset  . Heart attack Father   . Other Mother     bilateral mastectomies at 45y due to precancerous tumors  . Breast cancer Maternal Aunt     dx. early 18s  . Heart Problems Maternal Aunt   . Lung cancer Maternal Uncle     dx. 62-62; former smoker  . Lung cancer Maternal Grandmother     dx. 48s; not a smoker; "metastasis to colon"  . Brain cancer Other     dx. >50s  . Cancer Other     dx. <50s; unknown cancer/dx. 74s; unknown type    Social History   Social History  . Marital Status: Married    Spouse Name: N/A  . Number of Children: N/A  . Years of Education: N/A   Social History Main Topics  . Smoking status: Never Smoker   . Smokeless tobacco: Never Used  . Alcohol Use: 1.2 oz/week    2 Glasses of wine per week     Comment: once every two weeks   .  Drug Use: No  . Sexual Activity:    Partners: Male    Birth Control/ Protection: Surgical   Other Topics Concern  . 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  alendronate (FOSAMAX) 70 MG tablet, Take 1 tablet (70 mg total) by mouth every 7 (seven) days. Take with a full glass of water on an empty stomach., Disp: 12 tablet, Rfl: 3 .  Cholecalciferol (VITAMIN D3) 5000 UNITS TABS, Take 1 capsule by mouth daily., Disp: 30 tablet, Rfl:  .  desvenlafaxine (PRISTIQ) 50 MG 24 hr tablet, Take 1 tablet (50 mg total) by mouth daily., Disp: 90 tablet, Rfl: 3 .  gabapentin (NEURONTIN) 100 MG capsule, take 1 capsule at night, Disp: 90 capsule, Rfl: 3 .  omeprazole (PRILOSEC) 20 MG capsule, Take 1 capsule (20 mg total) by mouth daily., Disp: 90 capsule, Rfl: 3 .  topiramate (TOPAMAX) 50 MG tablet, Take 1 tablet (50 mg total) by mouth at bedtime., Disp: 30 tablet, Rfl: 6 .  vitamin B-12 (CYANOCOBALAMIN) 250 MCG tablet, Take 250 mcg by mouth daily., Disp: , Rfl:   EXAM:  Filed Vitals:   01/22/15 1547  BP: 116/80  Pulse: 89  Temp: 97.9 F (36.6 C)    Body mass index is 30.03 kg/(m^2).  GENERAL: vitals reviewed and listed above, alert, oriented, appears well hydrated and in no acute distress  HEENT: atraumatic, conjunttiva clear, no obvious abnormalities on inspection of external nose and ears  NECK: no obvious masses on inspection  LUNGS: clear to auscultation bilaterally, no wheezes, rales or rhonchi, good air movement  CV: HRRR, no peripheral edema  ABD: BS+, soft, NTTP, no CVA TTP  MS: moves all extremities without noticeable abnormality  PSYCH: pleasant and cooperative, no obvious depression or anxiety  ASSESSMENT AND PLAN:  Discussed the  following assessment and plan:  Dysuria - Plan: POC Urinalysis Dipstick  -udip ok, 1+leuks, will get cx -cran/blueberry juice, follow up if symptoms persist -Patient advised to return or notify a doctor immediately if symptoms worsen or persist or new concerns arise.  There are no Patient Instructions on file for this visit.   Colin Benton R.

## 2015-01-25 LAB — URINE CULTURE: Colony Count: >=100000

## 2015-01-25 NOTE — Telephone Encounter (Signed)
Discussed with Ms. Michelli that her genetic test results were negative for mutations within any of 8 genes that would cause her to be at an increased risk for breast or other related cancers.  Additionally, no variants of uncertain significance (VUSs) were found.  We still do not have an explanation for the personal or family history of cancer, but most cancer occurs by chance and many of the individuals diagnosed with cancer in the family have been diagnosed at later ages.  Ms. Zaldivar is welcome to call us back if she would like information on updated testing options in the future and to update the family history.  Women in the family are still considered to be at a somewhat increased risk for breast cancer based on the history of breast cancer.  Thus, women in the family should start having mammogram screening at the age of 86 (or 65 years younger than the earliest diagnosis in the family, whichever comes first).   Ms. Haser reports that her sister is already having annual mammograms.  Ms. Pearce is welcome to call back with any further questions.

## 2015-01-28 ENCOUNTER — Other Ambulatory Visit: Payer: Self-pay | Admitting: Family Medicine

## 2015-01-28 ENCOUNTER — Telehealth: Payer: Self-pay | Admitting: Family Medicine

## 2015-01-28 DIAGNOSIS — Z1379 Encounter for other screening for genetic and chromosomal anomalies: Secondary | ICD-10-CM | POA: Insufficient documentation

## 2015-01-28 MED ORDER — NITROFURANTOIN MONOHYD MACRO 100 MG PO CAPS: 100.0000 mg | ORAL_CAPSULE | Freq: Two times a day (BID) | ORAL | Status: DC

## 2015-01-28 NOTE — Telephone Encounter (Signed)
Error

## 2015-01-28 NOTE — Progress Notes (Signed)
GENETIC TEST RESULTS  HPI: Ms. Negash was previously seen in the [place] in 2009 at which time she had negative BRACAnalysis testing through [facility].  She returned to [facility] on December 24, 2014, following receipt of our recall letter, for updated genetic testing.  Please refer to our prior cancer genetics clinic note from December 24, 2014 for more information regarding Ms. Goyal's medical, social and family histories, and our assessment and recommendations, at the time. Ms. Machi recent genetic test results were disclosed to her, as were recommendations warranted by these results. These results and recommendations are discussed in more detail below.  GENETIC TEST RESULTS: At the time of Ms. Metheney's visit on 12/24/14, we recommended she pursue genetic testing BRCA1/2 Ashkenazi Jewish 3-Site Mutation Panel with reflex to the 8-gene BRCAplus-Expanded Panel if negative, through [facility] [place]).  The BRCAplus-Expanded Panel offered by [employer] includes sequencing and deletion/duplication analysis for the following 8 genes: ATM, BRCA1, BRCA2, CDH1, CHEK2, PALB2, PTEN, and TP53.  Those results are now back, the final report date for which is January 15, 2015.  Genetic testing was normal, and did not reveal a deleterious mutation in these genes. The test report will be scanned into EPIC and will be located under the Results Review tab in the Molecular Pathology section.   We discussed with Ms. Blais that since the current genetic testing is not perfect, it is possible there may be a gene mutation in one of these genes that current testing cannot detect, but that chance is small. We also discussed, that it is possible that another gene that has not yet been discovered, or that we have not yet tested, is responsible for the cancer diagnoses in the family, and it is, therefore, important to remain in touch with cancer  genetics in the future so that we can continue to offer Ms. Oleski the most up to date genetic testing.   CANCER SCREENING RECOMMENDATIONS: While we still do not have an explanation for the personal or family history of cancer, this result is reassuring and indicates that Ms. Scherger likely does not have an increased risk for a future cancer due to a mutation in one of these genes. This normal test also suggests that Ms. Kopp's cancer was most likely not due to an inherited predisposition associated with one of these genes.  Most cancers happen by chance and this negative test suggests that her cancer falls into this category.  We, therefore, recommended she continue to follow the cancer management and screening guidelines provided by her oncology and primary healthcare providers.  RECOMMENDATIONS FOR FAMILY MEMBERS: Women in this family might be at some increased risk of developing cancer, over the general population risk, simply due to the family history of cancer. We recommended women in this family have a yearly mammogram beginning at age 93, or 54 years younger than the earliest onset of cancer, an an annual clinical breast exam, and perform monthly breast self-exams.  Ms. Madia reports that her sister is receiving annual mammograms.  Women in this family should also have a gynecological exam as recommended by their primary provider. All family members should have a colonoscopy by age 41.  FOLLOW-UP: Lastly, we discussed with Ms. Eduardo that cancer genetics is a rapidly advancing field and it is possible that new genetic tests will be appropriate for her and/or her family members in the future. We encouraged her to remain in contact with cancer genetics on  an annual basis so we can update her personal and family histories and let her know of advances in cancer genetics that may benefit this family.   Our contact number was provided. Ms. Crumbley questions were answered to her  satisfaction, and she knows she is welcome to call us at anytime with additional questions or concerns.   Jeanine Luz MS Genetic Counselor [email or web address] Phone: [phone]

## 2015-02-18 ENCOUNTER — Encounter: Payer: Self-pay | Admitting: Obstetrics and Gynecology

## 2015-02-18 ENCOUNTER — Ambulatory Visit (INDEPENDENT_AMBULATORY_CARE_PROVIDER_SITE_OTHER): Payer: BC Managed Care – PPO | Admitting: Obstetrics and Gynecology

## 2015-02-18 VITALS — BP 118/70 | HR 60 | Resp 14 | Ht 61.25 in | Wt 159.0 lb

## 2015-02-18 DIAGNOSIS — Z01419 Encounter for gynecological examination (general) (routine) without abnormal findings: Secondary | ICD-10-CM

## 2015-02-18 DIAGNOSIS — N952 Postmenopausal atrophic vaginitis: Secondary | ICD-10-CM

## 2015-02-18 DIAGNOSIS — M810 Age-related osteoporosis without current pathological fracture: Secondary | ICD-10-CM | POA: Diagnosis not present

## 2015-02-18 DIAGNOSIS — Z853 Personal history of malignant neoplasm of breast: Secondary | ICD-10-CM

## 2015-02-18 DIAGNOSIS — N941 Unspecified dyspareunia: Secondary | ICD-10-CM

## 2015-02-18 NOTE — Patient Instructions (Signed)

## 2015-02-18 NOTE — Progress Notes (Signed)
Patient ID: Anna Navarro, female   DOB: 1955-08-25, 59 y.o.   MRN: [id number] 59 y.o. [id number] MarriedCaucasianF here for annual exam.  The patient has a h/o right breast cancer (ER+). She c/o vaginal dryness, pain with intercourse. She uses lubricant, able to be sexually active.  No vaginal bleeding. H/O TAH, partial removal of ovaries.     No LMP recorded. Patient has had a hysterectomy.          Sexually active: Yes.    The current method of family planning is status post hysterectomy.    Exercising: Yes.    cardio/ weight lifting Smoker:  no  Health Maintenance: Pap:  2006- Normal  History of abnormal Pap:  no MMG:  08-22-14 WNL Colonoscopy:  2009 -repeat in 10 yrs  BMD:   unsure TDaP:  Unsure, with primary Gardasil: N/A   reports that she has never smoked. She has never used smokeless tobacco. She reports that she drinks about 1.2 oz of alcohol per week. She reports that she does not use illicit drugs.3 children (one adopted), all are local. No grandchildren yet.   Past Medical History  Diagnosis Date  . Depression 02/03/2007  . Vestibular neuritis   . UTI (lower urinary tract infection) 11/12/2006  . Chronic eustachian tube dysfunction 05/23/2010  . GERD 11/12/2006  . Hyperlipemia 11/12/2006  . H/O iron deficiency anemia 05/09/2007    per pt from grastic bypass  . Low back pain 02/03/2007  . Neck mass 01/03/2007  . Warts 01/07/2010    hands  . Allergic rhinitis 02/03/2007  . Osteoporosis   . DDD (degenerative disc disease), lumbar     seeing Dr. Ellene Route  . Breast cancer ([place]) 2009    IDC of UOQ of right breast; ER/PR+, Her2-, Ki67 10%; s/p lumpectomy and radiation 2009    Past Surgical History  Procedure Laterality Date  . Gastric bypass    . Total abdominal hysterectomy    . Breast lumpectomy with axillary lymph node biopsy Right 08/2007    RT, ER/PR+, T1N0    Current Outpatient Prescriptions  Medication Sig Dispense Refill  . alendronate (FOSAMAX) 70 MG tablet Take 1  tablet (70 mg total) by mouth every 7 (seven) days. Take with a full glass of water on an empty stomach. 12 tablet 3  . Cholecalciferol (VITAMIN D3) 5000 UNITS TABS Take 1 capsule by mouth daily. 30 tablet   . desvenlafaxine (PRISTIQ) 50 MG 24 hr tablet Take 1 tablet (50 mg total) by mouth daily. 90 tablet 3  . gabapentin (NEURONTIN) 100 MG capsule take 1 capsule at night 90 capsule 3  . HYDROcodone-acetaminophen (NORCO/VICODIN) 5-325 MG tablet     . nitrofurantoin, macrocrystal-monohydrate, (MACROBID) 100 MG capsule Take 1 capsule (100 mg total) by mouth 2 (two) times daily. 14 capsule 0  . topiramate (TOPAMAX) 50 MG tablet Take 1 tablet (50 mg total) by mouth at bedtime. 30 tablet 6  . vitamin B-12 (CYANOCOBALAMIN) 250 MCG tablet Take 250 mcg by mouth daily.     No current facility-administered medications for this visit.    Family History  Problem Relation Age of Onset  . Heart attack Father   . Other Mother     bilateral mastectomies at 45y due to precancerous tumors  . Breast cancer Maternal Aunt     dx. early 56s  . Heart Problems Maternal Aunt   . Lung cancer Maternal Uncle     dx. 62-62; former smoker  . Lung cancer Maternal  Grandmother     dx. 35s; not a smoker; "metastasis to colon"  . Brain cancer Other     dx. >50s  . Cancer Other     dx. <50s; unknown cancer/dx. 28s; unknown type    Review of Systems  Constitutional: Negative.   HENT: Negative.   Eyes: Negative.   Respiratory: Negative.   Cardiovascular: Negative.   Gastrointestinal: Negative.   Endocrine: Negative.   Genitourinary: Positive for dyspareunia.       Loss of sexual interest   Musculoskeletal: Negative.   Skin: Negative.   Allergic/Immunologic: Negative.   Neurological: Negative.   Psychiatric/Behavioral: Negative.     Exam:   BP 118/70 mmHg  Pulse 60  Resp 14  Ht 5' 1.25" (1.556 m)  Wt 159 lb (72.122 kg)  BMI 29.79 kg/m2  Weight change: @WEIGHTCHANGE @ Height:   Height: 5' 1.25" (155.6  cm)  Ht Readings from Last 3 Encounters:  02/18/15 5' 1.25" (1.556 m)  01/22/15 5' 1.25" (1.556 m)  11/02/14 5' 1.25" (1.556 m)    General appearance: alert, cooperative and appears stated age Head: Normocephalic, without obvious abnormality, atraumatic Neck: no adenopathy, supple, symmetrical, trachea midline and thyroid normal to inspection and palpation Lungs: clear to auscultation bilaterally Breasts: Evidence of right breast lumpectomy, no lumps, dimpling or discharge. Heart: regular rate and rhythm Abdomen: soft, non-tender; bowel sounds normal; no masses,  no organomegaly Extremities: extremities normal, atraumatic, no cyanosis or edema Skin: Skin color, texture, turgor normal. No rashes or lesions Lymph nodes: Cervical, supraclavicular, and axillary nodes normal. No abnormal inguinal nodes palpated Neurologic: Grossly normal   Pelvic: External genitalia:  no lesions              Urethra:  normal appearing urethra with no masses, tenderness or lesions              Bartholins and Skenes: normal                 Vagina: mild vaginal atrophy. Able to insert 2 fingers into the vagina              Cervix: absent               Bimanual Exam:  Uterus:  uterus absent              Adnexa: no mass, fullness, tenderness               Rectovaginal: Confirms               Anus:  normal sphincter tone, no lesions  Chaperone was present for exam.  A:  Well Woman with normal exam  Osteoporosis  H/O breast cancer  Vaginal atrophy with entry dyspareunia, not able to use vaginal estrogen  P:   DEXA  Patient to control rate and depth of penetration, discussed lubrication, discussed the option of vaginal dilators  Labs and immunizations with primary MD  Mammogram and colonoscopy are UTD  Continue breast self exams  Continue calcium and vit D

## 2015-02-19 ENCOUNTER — Encounter (HOSPITAL_COMMUNITY): Payer: Self-pay

## 2015-02-20 ENCOUNTER — Telehealth: Payer: Self-pay | Admitting: Genetic Counselor

## 2015-02-20 NOTE — Telephone Encounter (Signed)
Anna Navarro received an EOB in the mail and wanted to make sure that she was not going to be responsible for paying a bill of $6400.  We discussed that since she did not receive a phone call from the lab, her out-of-pocket cost will not be more than $100.  If she has an out-of-pocket, she will receive a bill directly from the lab.  She is welcome to call me with any further questions.

## 2015-03-06 ENCOUNTER — Encounter: Payer: Self-pay | Admitting: Obstetrics and Gynecology

## 2015-03-07 ENCOUNTER — Telehealth: Payer: Self-pay

## 2015-03-07 DIAGNOSIS — M810 Age-related osteoporosis without current pathological fracture: Secondary | ICD-10-CM

## 2015-03-07 NOTE — Telephone Encounter (Signed)
Spoke with patient regarding mychart message as seen below. Reviewed with patient where she prefers to have her BMD performed as there are two different locations in the computer for her. Patient prefers to have this done with [facility]. Offered to call and schedule appointment for patient. Patient declines as she prefers to call and schedule to make sure it correlates with her work schedule.  Routing to provider for final review. Patient agreeable to disposition. Will close encounter.

## 2015-03-07 NOTE — Telephone Encounter (Signed)
Non-Urgent Medical Question  Message [id number]   From  DAILY DOE   To  [name], MD   Sent  03/06/2015 9:18 PM     Good evening- I saw you in your office on October 3rd and you put a request in for me to have a bone density test done. I told you my GP also requested one this past summer and no one called me to set up an appointment. They still have not called me even after you requested it on the 3rd of October. You asked me to let you know if I was not contacted. Thank you for your response as to what I should do since this is the second time this has happened.  Samule Dry      Responsible Party    Pool - [facility] Pool No one has taken responsibility for this message.     No actions have been taken on this message.

## 2015-03-07 NOTE — Telephone Encounter (Signed)
Spoke with patient regarding [employer]. Reviewed with patient where she prefers to have her BMD performed as there are two different locations in the computer for her. Patient prefers to have this done with [facility]. [facility] order for [facility] to [facility]. Offered to call and schedule appointment for patient. Patient declines as she prefers to call and schedule to make sure it correlates with her work schedule.  Routing to provider for final review. Patient agreeable to disposition. Will close encounter.

## 2015-03-22 ENCOUNTER — Encounter (HOSPITAL_COMMUNITY): Payer: Self-pay

## 2015-03-26 ENCOUNTER — Encounter: Payer: BC Managed Care – PPO | Admitting: Family Medicine

## 2015-04-09 ENCOUNTER — Other Ambulatory Visit: Payer: BC Managed Care – PPO

## 2015-05-01 ENCOUNTER — Ambulatory Visit (INDEPENDENT_AMBULATORY_CARE_PROVIDER_SITE_OTHER): Payer: BC Managed Care – PPO | Admitting: Neurology

## 2015-05-01 ENCOUNTER — Encounter: Payer: Self-pay | Admitting: Neurology

## 2015-05-01 VITALS — BP 132/78 | HR 72 | Ht 61.5 in | Wt 157.0 lb

## 2015-05-01 DIAGNOSIS — G43809 Other migraine, not intractable, without status migrainosus: Secondary | ICD-10-CM

## 2015-05-01 DIAGNOSIS — G43109 Migraine with aura, not intractable, without status migrainosus: Secondary | ICD-10-CM

## 2015-05-01 MED ORDER — TOPIRAMATE 25 MG PO TABS: 75.0000 mg | ORAL_TABLET | Freq: Every day | ORAL | Status: DC

## 2015-05-01 NOTE — Patient Instructions (Signed)
Increase topiramate to 75mg  at bedtime.  I sent a prescription for 25mg  tablets, so take 3 at bedtime.  If you still have 50mg  tablets available, you can take 1 and 1/2 tablets at bedtime until you run out. Call in 1 to 2 months with update and we can adjust dose further if needed Follow up in 6 months

## 2015-05-01 NOTE — Progress Notes (Signed)
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  UPDATE: She is taking topiramate 41m at bedtime.  She had been doing well up until the last month.  She has been under stress, which has triggered an increase in the dizzy spells.  She gets them once a week but they don't last as long.  She will get them in the evening and they are resolved by the next morning.  HISTORY: She began having recurrent episodes of dizziness in March.  She describes it as a sensation of movement, like during a drop on a roller coaster, as well as spinning.  It occurs spontaneously and at rest, but can be exacerbated with movement.  Sometimes it is so severe, she was unable to drive and needed her husband to come and pick her up.  She may feel a little queasy, but she denies any significant nausea and no vomiting.  She does endorse photophobia, phonophobia and osmophobia.  She denies lightheadedness, vision loss, focal numbness or weakness, or slurred speech. It is associated with a dull, 6/10 non-throbbing holocephalic ache.  There are no known triggers.  Only rest and sleep help.  It typically lasts 2-3 days and occurred once a week to once every other week.  However, she has been symptom-free for the past month.  She has taken meclizine, which is not too effective.  Even though the spells are episodic, she didn't quite feel right in between spells.  [facility]  An ENG showed central findings.  She had an MRI of the brain with and without contrast, which was reportedly normal.  She denies preceding viral illness or head trauma.  She denies prior history of migraine.  She denies family history of migraine.  PAST MEDICAL HISTORY: Past  Medical History  Diagnosis Date  . Depression 02/03/2007  . Vestibular neuritis   . UTI (lower urinary tract infection) 11/12/2006  . Chronic eustachian tube dysfunction 05/23/2010  . GERD 11/12/2006  . Hyperlipemia 11/12/2006  . H/O iron deficiency anemia 05/09/2007    per pt from grastic bypass  . Low back pain 02/03/2007  . Neck mass 01/03/2007  . Warts 01/07/2010    hands  . Allergic rhinitis 02/03/2007  . Osteoporosis   . DDD (degenerative disc disease), lumbar     seeing Dr. EEllene Route . [place]    IDC of UOQ of right breast; ER/PR+, Her2-, Ki67 10%; s/p lumpectomy and radiation 2009    MEDICATIONS: Current Outpatient Prescriptions on File Prior to Visit  Medication Sig Dispense Refill  . Cholecalciferol (VITAMIN D3) 5000 UNITS TABS Take 1 capsule by mouth daily. 30 tablet   . desvenlafaxine (PRISTIQ) 50 MG 24 hr tablet Take 1 tablet (50 mg total) by mouth daily. 90 tablet 3  . gabapentin (NEURONTIN) 100 MG capsule take 1 capsule at night 90 capsule 3  . vitamin B-12 (CYANOCOBALAMIN) 250 MCG tablet Take 250 mcg by mouth daily.     No current facility-administered medications on file prior to visit.    ALLERGIES: No Known Allergies  FAMILY HISTORY: Family History  Problem Relation Age of Onset  . Heart attack Father   . Other Mother     bilateral mastectomies at  45y due to precancerous tumors  . Breast cancer Maternal Aunt     dx. early 23s  . Heart Problems Maternal Aunt   . Lung cancer Maternal Uncle     dx. 62-62; former smoker  . Lung cancer Maternal Grandmother     dx. 32s; not a smoker; "metastasis to colon"  . Brain cancer Other     dx. >50s  . Cancer Other     dx. <50s; unknown cancer/dx. 23s; unknown type    SOCIAL HISTORY: Social History   Social History  . Marital Status: Married    Spouse Name: N/A  . Number of Children: N/A  . Years of Education: N/A   Occupational History  . Not on file.   Social History Main Topics  . Smoking  status: Never Smoker   . Smokeless tobacco: Never Used  . Alcohol Use: 1.2 oz/week    2 Glasses of wine per week     Comment: once every two weeks   . Drug Use: No  . Sexual Activity:    Partners: Male    Birth Control/ Protection: Surgical   Other Topics Concern  . 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      Home Situation: lives with husband      Spiritual Beliefs: Jewish      Lifestyle: regular exercise 2-4 days per week; trying to eat healthy          REVIEW OF SYSTEMS: Constitutional: No fevers, chills, or sweats, no generalized fatigue, change in appetite Eyes: No visual changes, double vision, eye pain Ear, nose and throat: No hearing loss, ear pain, nasal congestion, sore throat Cardiovascular: No chest pain, palpitations Respiratory:  No shortness of breath at rest or with exertion, wheezes GastrointestinaI: No nausea, vomiting, diarrhea, abdominal pain, fecal incontinence Genitourinary:  No dysuria, urinary retention or frequency Musculoskeletal:  No neck pain, back pain Integumentary: No rash, pruritus, skin lesions Neurological: as above Psychiatric: No depression, insomnia, anxiety Endocrine: No palpitations, fatigue, diaphoresis, mood swings, change in appetite, change in weight, increased thirst Hematologic/Lymphatic:  No anemia, purpura, petechiae. Allergic/Immunologic: no itchy/runny eyes, nasal congestion, recent allergic reactions, rashes  PHYSICAL EXAM: Filed Vitals:   05/01/15 1521  BP: 132/78  Pulse: 72   General: No acute distress.  Patient appears well-groomed.  normal body habitus. Head:  Normocephalic/atraumatic Eyes:  Fundoscopic exam unremarkable without vessel changes, exudates, hemorrhages or papilledema. Neck: supple, no paraspinal tenderness, full range of motion Heart:  Regular rate and rhythm Lungs:  Clear to auscultation bilaterally Back: No paraspinal tenderness Neurological  Exam: alert and oriented to person, place, and time. Attention span and concentration intact, recent and remote memory intact, fund of knowledge intact.  Speech fluent and not dysarthric, language intact.  CN II-XII intact. Fundoscopic exam unremarkable without vessel changes, exudates, hemorrhages or papilledema.  Bulk and tone normal, muscle strength 5/5 throughout.  Sensation to light touch  intact.  Deep tendon reflexes 2+ throughout.  Finger to nose and heel to shin testing intact.  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  She may call in 1 to 2 months with update. Follow up in 6 months.  15 minutes spent face to face with patient, over 50% spent discussing  management.  AMetta Clines DO  CC:  HColin Benton DO

## 2015-05-24 ENCOUNTER — Other Ambulatory Visit: Payer: BC Managed Care – PPO

## 2015-06-20 ENCOUNTER — Encounter: Payer: Self-pay | Admitting: Neurology

## 2015-08-12 ENCOUNTER — Other Ambulatory Visit: Payer: Self-pay | Admitting: Obstetrics and Gynecology

## 2015-08-12 DIAGNOSIS — Z853 Personal history of malignant neoplasm of breast: Secondary | ICD-10-CM

## 2015-08-12 DIAGNOSIS — Z1231 Encounter for screening mammogram for malignant neoplasm of breast: Secondary | ICD-10-CM

## 2015-08-30 ENCOUNTER — Other Ambulatory Visit: Payer: BC Managed Care – PPO

## 2015-08-30 ENCOUNTER — Ambulatory Visit: Payer: BC Managed Care – PPO

## 2015-09-17 ENCOUNTER — Ambulatory Visit
Admission: RE | Admit: 2015-09-17 | Discharge: 2015-09-17 | Disposition: A | Discharge status: Home / Self Care | Payer: BC Managed Care – PPO | Source: Ambulatory Visit | Attending: Obstetrics and Gynecology | Admitting: Obstetrics and Gynecology

## 2015-09-17 DIAGNOSIS — Z853 Personal history of malignant neoplasm of breast: Secondary | ICD-10-CM

## 2015-09-17 DIAGNOSIS — Z1231 Encounter for screening mammogram for malignant neoplasm of breast: Secondary | ICD-10-CM

## 2015-09-17 DIAGNOSIS — M810 Age-related osteoporosis without current pathological fracture: Secondary | ICD-10-CM

## 2015-10-30 ENCOUNTER — Ambulatory Visit (INDEPENDENT_AMBULATORY_CARE_PROVIDER_SITE_OTHER): Payer: BC Managed Care – PPO | Admitting: Neurology

## 2015-10-30 ENCOUNTER — Encounter: Payer: Self-pay | Admitting: Neurology

## 2015-10-30 ENCOUNTER — Telehealth: Payer: Self-pay

## 2015-10-30 VITALS — BP 112/68 | HR 72 | Wt 151.0 lb

## 2015-10-30 DIAGNOSIS — G43109 Migraine with aura, not intractable, without status migrainosus: Secondary | ICD-10-CM

## 2015-10-30 DIAGNOSIS — G43809 Other migraine, not intractable, without status migrainosus: Secondary | ICD-10-CM

## 2015-10-30 MED ORDER — TOPIRAMATE 100 MG PO TABS: 100.0000 mg | ORAL_TABLET | Freq: Every day | ORAL | Status: DC

## 2015-10-30 MED ORDER — NAPROXEN 500 MG PO TABS: ORAL_TABLET | ORAL | Status: DC

## 2015-10-30 MED ORDER — DIAZEPAM 5 MG PO TABS: ORAL_TABLET | ORAL | Status: DC

## 2015-10-30 NOTE — Progress Notes (Signed)
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  UPDATE: She is taking topiramate 58m at bedtime.  She has been averaging 2 to 3 migraines/dizzy spells per month.  She is not taking anything for it.  Stress from school is a factor, but her last day for the year is tomorrow.  She is exercising, watching her diet and keeping hydrated.  Sleep is good.  HISTORY: She began having recurrent episodes of dizziness in March.  She describes it as a sensation of movement, like during a drop on a roller coaster, as well as spinning.  It occurs spontaneously and at rest, but can be exacerbated with movement.  Sometimes it is so severe, she was unable to drive and needed her husband to come and pick her up.  She may feel a little queasy, but she denies any significant nausea and no vomiting.  She does endorse photophobia, phonophobia and osmophobia.  She denies lightheadedness, vision loss, focal numbness or weakness, or slurred speech. It is associated with a dull, 6/10 non-throbbing holocephalic ache.  There are no known triggers.  Only rest and sleep help.  It typically lasts 2-3 days and occurred once a week to once every other week.  However, she has been symptom-free for the past month.  She has taken meclizine, which is not too effective.  Even though the spells are episodic, she didn't quite feel right in between spells.  [facility]  [facility] ENG showed central findings.  She had an MRI of the brain with and without contrast, which was reportedly normal.  She denies preceding viral illness or head trauma.  She denies prior history of migraine.  She denies family history of migraine.  PAST MEDICAL HISTORY: Past Medical  History  Diagnosis Date  . Depression 02/03/2007  . Vestibular neuritis   . UTI (lower urinary tract infection) 11/12/2006  . Chronic eustachian tube dysfunction 05/23/2010  . GERD 11/12/2006  . Hyperlipemia 11/12/2006  . H/O iron deficiency anemia 05/09/2007    per pt from grastic bypass  . Low back pain 02/03/2007  . Neck mass 01/03/2007  . Warts 01/07/2010    hands  . Allergic rhinitis 02/03/2007  . Osteoporosis   . DDD (degenerative disc disease), lumbar     seeing Dr. EEllene Route . [place]    IDC of UOQ of right breast; ER/PR+, Her2-, Ki67 10%; s/p lumpectomy and radiation 2009    MEDICATIONS: Current Outpatient Prescriptions on File Prior to Visit  Medication Sig Dispense Refill  . Cholecalciferol (VITAMIN D3) 5000 UNITS TABS Take 1 capsule by mouth daily. 30 tablet   . desvenlafaxine (PRISTIQ) 50 MG 24 hr tablet Take 1 tablet (50 mg total) by mouth daily. 90 tablet 3  . gabapentin (NEURONTIN) 100 MG capsule take 1 capsule at night 90 capsule 3  . vitamin B-12 (CYANOCOBALAMIN) 250 MCG tablet Take 250 mcg by mouth daily.     No current facility-administered medications on file prior to visit.    ALLERGIES: No Known Allergies  FAMILY HISTORY: Family History  Problem Relation Age of Onset  . Heart attack Father   . Other Mother     bilateral mastectomies at 45y due to precancerous tumors  .  Breast cancer Maternal Aunt     dx. early 84s  . Heart Problems Maternal Aunt   . Lung cancer Maternal Uncle     dx. 62-62; former smoker  . Lung cancer Maternal Grandmother     dx. 52s; not a smoker; "metastasis to colon"  . Brain cancer Other     dx. >50s  . Cancer Other     dx. <50s; unknown cancer/dx. 54s; unknown type    SOCIAL HISTORY: Social History   Social History  . Marital Status: Married    Spouse Name: N/A  . Number of Children: N/A  . Years of Education: N/A   Occupational History  . Not on file.   Social History Main Topics  . Smoking status:  Never Smoker   . Smokeless tobacco: Never Used  . Alcohol Use: 1.2 oz/week    2 Glasses of wine per week     Comment: once every two weeks   . Drug Use: No  . Sexual Activity:    Partners: Male    Birth Control/ Protection: Surgical   Other Topics Concern  . 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      Home Situation: lives with husband      Spiritual Beliefs: Jewish      Lifestyle: regular exercise 2-4 days per week; trying to eat healthy          REVIEW OF SYSTEMS: Constitutional: No fevers, chills, or sweats, no generalized fatigue, change in appetite Eyes: No visual changes, double vision, eye pain Ear, nose and throat: No hearing loss, ear pain, nasal congestion, sore throat Cardiovascular: No chest pain, palpitations Respiratory:  No shortness of breath at rest or with exertion, wheezes GastrointestinaI: No nausea, vomiting, diarrhea, abdominal pain, fecal incontinence Genitourinary:  No dysuria, urinary retention or frequency Musculoskeletal:  No neck pain, back pain Integumentary: No rash, pruritus, skin lesions Neurological: as above Psychiatric: No depression, insomnia, anxiety Endocrine: No palpitations, fatigue, diaphoresis, mood swings, change in appetite, change in weight, increased thirst Hematologic/Lymphatic:  No purpura, petechiae. Allergic/Immunologic: no itchy/runny eyes, nasal congestion, recent allergic reactions, rashes  PHYSICAL EXAM: Filed Vitals:   10/30/15 1105  BP: 112/68  Pulse: 72   General: No acute distress.  Patient appears well-groomed.  normal body habitus. Head:  Normocephalic/atraumatic Eyes:  Fundi examined but not visualized Neck: supple, no paraspinal tenderness, full range of motion Heart:  Regular rate and rhythm Lungs:  Clear to auscultation bilaterally Back: No paraspinal tenderness Neurological Exam: alert and oriented to person, place, and time. Attention span and  concentration intact, recent and remote memory intact, fund of knowledge intact.  Speech fluent and not dysarthric, language intact.  CN II-XII intact. Bulk and tone normal, muscle strength 5/5 throughout.  Sensation to light touch, temperature and vibration intact.  Deep tendon reflexes 2+ throughout, toes downgoing.  Finger to nose and heel to shin testing intact.  Gait normal  IMPRESSION: Vestibular migraine  PLAN: 1.  Increase topiramate to 154m at bedtime.  Will obtain recent labs (BMP) from PCP's office 2.  For abortive therapy, naproxen 50456m for headache (repeat dose greater than 2 hours later) and diazepam 2.56m84mor vertigo (up to every 6 hours).  Advised to try the diazepam when she knows she will remain at home due to possible drowsiness. 3.  Follow up in 6 months.  15 minutes spent face to face with patient, over 50% spent discussing  management.  Metta Clines, DO  CC: Maury Dus, MD

## 2015-10-30 NOTE — Patient Instructions (Addendum)
1.  Will increase topiramate to 100mg  at bedtime.  2.  When you get a migraine headache, take naproxen 500mg .  If headache persists, you may repeat dose once no sooner than 2 hours later.  Do not exceed 2 tablets in 24 hours.  If you experience stomach upset, take with food. 3.  For dizziness, take 1/2 tablet of diazepam 5mg  tablet.  You may take up to every 6 hours as needed.  Caution for drowsiness, so first take when you know you will be at home and not out driving. 4.  Follow up in 6 months.  Contact us with any questions or concerns. 5. We will contact Dr. Noland Fordyce office to obtain your labs.

## 2015-10-30 NOTE — Progress Notes (Signed)
Chart forwarded.  

## 2015-11-04 NOTE — Telephone Encounter (Signed)
Opened in error

## 2016-02-19 ENCOUNTER — Ambulatory Visit: Payer: BC Managed Care – PPO | Admitting: Obstetrics and Gynecology

## 2016-03-02 ENCOUNTER — Ambulatory Visit (INDEPENDENT_AMBULATORY_CARE_PROVIDER_SITE_OTHER): Payer: BC Managed Care – PPO | Admitting: Obstetrics and Gynecology

## 2016-03-02 ENCOUNTER — Encounter: Payer: Self-pay | Admitting: Obstetrics and Gynecology

## 2016-03-02 VITALS — BP 126/82 | HR 84 | Resp 15 | Ht 61.0 in | Wt 150.0 lb

## 2016-03-02 DIAGNOSIS — N952 Postmenopausal atrophic vaginitis: Secondary | ICD-10-CM

## 2016-03-02 DIAGNOSIS — R350 Frequency of micturition: Secondary | ICD-10-CM | POA: Diagnosis not present

## 2016-03-02 DIAGNOSIS — Z0000 Encounter for general adult medical examination without abnormal findings: Secondary | ICD-10-CM | POA: Diagnosis not present

## 2016-03-02 DIAGNOSIS — Z01419 Encounter for gynecological examination (general) (routine) without abnormal findings: Secondary | ICD-10-CM | POA: Diagnosis not present

## 2016-03-02 DIAGNOSIS — N941 Unspecified dyspareunia: Secondary | ICD-10-CM

## 2016-03-02 LAB — POCT URINALYSIS DIPSTICK
Bilirubin, UA: NEGATIVE
Blood, UA: NEGATIVE
Glucose, UA: NEGATIVE
Ketones, UA: NEGATIVE
Nitrite, UA: NEGATIVE
Protein, UA: NEGATIVE
Urobilinogen, UA: NEGATIVE
pH, UA: 7

## 2016-03-02 NOTE — Patient Instructions (Signed)

## 2016-03-02 NOTE — Progress Notes (Addendum)
60 y.o. [id number] MarriedCaucasianF here for annual exam.   H/O right breast cancer (ER+). H/O TAH, partial removal of her ovaries. No vaginal bleeding. She is using a lubricant, still hurts, tolerable.  She c/o some lower back pain, frequency of urination, no burning. Some suprapubic pressure. Symptoms started about 4 days ago. Some urgency to void. No fevers, no flank pain.     No LMP recorded. Patient has had a hysterectomy.          Sexually active: Yes.    The current method of family planning is status post hysterectomy.    Exercising: Yes.    cardio/weights 3-4 x a week Smoker:  no  Health Maintenance: Pap:  2006 History of abnormal Pap:  no MMG:  09-18-15 WNL Colonoscopy:  2009 Normal per patient  BMD:  09/2015 Osteopenia. FRAX 16.3 and 2.2% TDaP:  UTD Gardasil: N/A   reports that she has never smoked. She has never used smokeless tobacco. She reports that she drinks about 1.2 oz of alcohol per week . She reports that she does not use drugs. Drinks socially. 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 No grandchildren yet.   Past Medical History:  Diagnosis Date  . Allergic rhinitis 02/03/2007  . Breast cancer ([place]) 2009   IDC of UOQ of right breast; ER/PR+, Her2-, Ki67 10%; s/p lumpectomy and radiation 2009  . Chronic eustachian tube dysfunction 05/23/2010  . DDD (degenerative disc disease), lumbar    seeing Dr. Ellene Route  . Depression 02/03/2007  . GERD 11/12/2006  . H/O iron deficiency anemia 05/09/2007   per pt from grastic bypass  . Hyperlipemia 11/12/2006  . Low back pain 02/03/2007  . Neck mass 01/03/2007  . Osteoporosis   . UTI (lower urinary tract infection) 11/12/2006  . Vestibular neuritis   . Warts 01/07/2010   hands    Past Surgical History:  Procedure Laterality Date  . BREAST LUMPECTOMY WITH AXILLARY LYMPH NODE BIOPSY Right 08/2007   RT, ER/PR+, T1N0  . GASTRIC BYPASS    . TOTAL ABDOMINAL HYSTERECTOMY    BRCA negative  Current Outpatient  Prescriptions  Medication Sig Dispense Refill  . Cholecalciferol (VITAMIN D3) 5000 UNITS TABS Take 1 capsule by mouth daily. 30 tablet   . desvenlafaxine (PRISTIQ) 50 MG 24 hr tablet Take 1 tablet (50 mg total) by mouth daily. 90 tablet 3  . gabapentin (NEURONTIN) 100 MG capsule take 1 capsule at night 90 capsule 3  . topiramate (TOPAMAX) 100 MG tablet Take 1 tablet (100 mg total) by mouth at bedtime. 30 tablet 5  . vitamin B-12 (CYANOCOBALAMIN) 250 MCG tablet Take 250 mcg by mouth daily.     No current facility-administered medications for this visit.     Family History  Problem Relation Age of Onset  . Heart attack Father   . Other Mother     bilateral mastectomies at 45y due to precancerous tumors  . Breast cancer Maternal Aunt     dx. early 53s  . Heart Problems Maternal Aunt   . Lung cancer Maternal Uncle     dx. 62-62; former smoker  . Lung cancer Maternal Grandmother     dx. 46s; not a smoker; "metastasis to colon"  . Brain cancer Other     dx. >50s  . Cancer Other     dx. <50s; unknown cancer/dx. 32s; unknown type    Review of Systems  Constitutional: Negative.   HENT: Negative.   Eyes: Negative.  Respiratory: Negative.   Cardiovascular: Negative.   Gastrointestinal: Negative.   Endocrine: Negative.   Genitourinary: Negative.   Musculoskeletal: Negative.   Skin: Negative.   Allergic/Immunologic: Negative.   Neurological: Negative.   Psychiatric/Behavioral: Negative.     Exam:   BP 126/82 (BP Location: Right Arm, Patient Position: Sitting, Cuff Size: Normal)   Pulse 84   Resp 15   Ht 5' 1"  (1.549 m)   Wt 150 lb (68 kg)   BMI 28.34 kg/m   Weight change: @WEIGHTCHANGE @ Height:   Height: 5' 1"  (154.9 cm)  Ht Readings from Last 3 Encounters:  03/02/16 5' 1"  (1.549 m)  05/01/15 5' 1.5" (1.562 m)  02/18/15 5' 1.25" (1.556 m)    General appearance: alert, cooperative and appears stated age Head: Normocephalic, without obvious abnormality, atraumatic Neck:  no adenopathy, supple, symmetrical, trachea midline and thyroid normal to inspection and palpation Lungs: clear to auscultation bilaterally Breasts: normal appearance, no masses or tenderness Heart: regular rate and rhythm Abdomen: soft, non-tender; bowel sounds normal; no masses,  no organomegaly Extremities: extremities normal, atraumatic, no cyanosis or edema Skin: Skin color, texture, turgor normal. No rashes or lesions Lymph nodes: Cervical, supraclavicular, and axillary nodes normal. No abnormal inguinal nodes palpated Neurologic: Grossly normal   Pelvic: External genitalia:  no lesions              Urethra:  normal appearing urethra with no masses, tenderness or lesions              Bartholins and Skenes: normal                 Vagina: normal appearing atrophic vagina with normal color and discharge, no lesions              Cervix: absent               Bimanual Exam:  Uterus:  uterus absent              Adnexa: no mass, fullness, tenderness               Rectovaginal: Confirms               Anus:  normal sphincter tone, no lesions  Chaperone was present for exam.  Urine dip: 1+ leuk  A:  Well Woman with normal exam  H/O breast cancer  Urinary frequency  Vaginal atrophy  Dyspareunia  Osteopenia  P:   No Pap  Continue calcium, vit D, exercise  F/U DEXA in 5/19  Mammogram is UTD  Colonoscopy UTD  Discussed breast self exam  Discussed calcium and vit D intake  I will check with Oncology about use of small amount of vaginal estrogen  Send urine for C&S (not enough to send for a ua)  Use a vaginal moisturizer   Addendum: Chauncey Cruel, MD  [name], MD        Sorry for the delay in replying-- it's been crazy   We have data in women WITHOUT breast cancer that estrogen alone does not appear to cause it (or course it can cause endometrial cancer, blood clots, etc). We do not have similar data in women with a history of breast cancer. Data for  transvaginal estrogens in that setting is even more scarce    --The issue is not so much the old cancer recurring as a new one developing.   What I do in these cases is explain the lack of data to  the patient and let them balance the possible risks vs the quality of life issues. If they can accept the uncertainty, and there is no other alternative, I have no problem with their decision though I do not myself prescribe vaginal estrogens in these cases.   Hope this helps!

## 2016-03-04 LAB — URINE CULTURE

## 2016-03-05 ENCOUNTER — Telehealth: Payer: Self-pay | Admitting: *Deleted

## 2016-03-05 MED ORDER — NITROFURANTOIN MONOHYD MACRO 100 MG PO CAPS: 100.0000 mg | ORAL_CAPSULE | Freq: Two times a day (BID) | ORAL | 0 refills | Status: DC

## 2016-03-05 NOTE — Telephone Encounter (Signed)
-----   Message from [name], MD sent at 03/04/2016  9:45 PM EDT ----- Please inform the patient that she does have a UTI and start her on macrobid 100 mg BID x 7 days.  I'm still waiting to hear back from the oncologist about the vaginal estrogen.

## 2016-03-05 NOTE — Telephone Encounter (Signed)
Left message to call regarding results -eh 

## 2016-03-05 NOTE — Telephone Encounter (Signed)
Spoke with patient and gave results. Patient voiced understanding. Sent in RX for Macrobid to patients local pharmacy -eh

## 2016-03-05 NOTE — Telephone Encounter (Signed)
-----   Message from Anna Dom, MD sent at 03/04/2016  9:45 PM EDT ----- Please inform the patient that she does have a UTI and start her on macrobid 100 mg BID x 7 days.  I'm still waiting to hear back from the oncologist about the vaginal estrogen.

## 2016-03-25 ENCOUNTER — Telehealth: Payer: Self-pay | Admitting: Obstetrics and Gynecology

## 2016-03-25 NOTE — Telephone Encounter (Signed)
I called the patient and reviewed the following message with her:  Chauncey Cruel, MD  [name], MD        Sorry for the delay in replying-- it's been crazy   We have data in women WITHOUT breast cancer that estrogen alone does not appear to cause it (or course it can cause endometrial cancer, blood clots, etc). We do not have similar data in women with a history of breast cancer. Data for transvaginal estrogens in that setting is even more scarce   --The issue is not so much the old cancer recurring as a new one developing.   What I do in these cases is explain the lack of data to the patient and let them balance the possible risks vs the quality of life issues. If they can accept the uncertainty, and there is no other alternative, I have no problem with their decision though I do not myself prescribe vaginal estrogens in these cases.   Hope this helps!    She will consider her options and get back to me. If she wants to try a vaginal estrogen, I would call in the generic vagifem to use 2 x a week. We discussed that if she was doing well with the vagifem, she could try spacing it to every 5-7 days.

## 2016-04-25 ENCOUNTER — Other Ambulatory Visit: Payer: Self-pay | Admitting: Neurology

## 2016-05-07 ENCOUNTER — Ambulatory Visit (INDEPENDENT_AMBULATORY_CARE_PROVIDER_SITE_OTHER): Payer: BC Managed Care – PPO | Admitting: Neurology

## 2016-05-07 ENCOUNTER — Encounter: Payer: Self-pay | Admitting: Neurology

## 2016-05-07 VITALS — BP 118/76 | HR 86 | Ht 61.0 in | Wt 147.2 lb

## 2016-05-07 DIAGNOSIS — G43109 Migraine with aura, not intractable, without status migrainosus: Secondary | ICD-10-CM

## 2016-05-07 DIAGNOSIS — G43809 Other migraine, not intractable, without status migrainosus: Secondary | ICD-10-CM

## 2016-05-07 NOTE — Progress Notes (Signed)
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   UPDATE: She is taking topiramate 156m at bedtime.  Over the past 6 months, she has had only 3 spells.  She had a severe spell in early-September, which she feels was triggered by stress related to returning to work.  She had two minor spells/headaches in October.  For abortive therapy, she is prescribed 5059mfor headache and diazepam 2.34m80mp to every 6 hours as needed for vertigo.     HISTORY: She began having recurrent episodes of dizziness in March.  She describes it as a sensation of movement, like during a drop on a roller coaster, as well as spinning.  It occurs spontaneously and at rest, but can be exacerbated with movement.  Sometimes it is so severe, she was unable to drive and needed her husband to come and pick her up.  She may feel a little queasy, but she denies any significant nausea and no vomiting.  She does endorse photophobia, phonophobia and osmophobia.  She denies lightheadedness, vision loss, focal numbness or weakness, or slurred speech. It is associated with a dull, 6/10 non-throbbing holocephalic ache.  There are no known triggers.  Only rest and sleep help.  Initially, at time of her first visit, t typically lasts 2-3 days and occurred once a week to once every other week.  However, she has been symptom-free for the past month.  She has taken meclizine, which is not too effective.  Even though the spells are episodic, she didn't quite feel right in between spells.   [facility].  She had an MRI of the brain with and without contrast, which was reportedly normal.   She denies preceding viral illness or head  trauma.  She denies prior history of migraine.  She denies family history of migraine.  Labs from May 2017:  BUN 16, Cr 0.80.  PAST MEDICAL HISTORY: Past Medical History:  Diagnosis Date  . Allergic rhinitis 02/03/2007  . [place]   IDC of UOQ of right breast; ER/PR+, Her2-, Ki67 10%; s/p lumpectomy and radiation 2009  . Chronic eustachian tube dysfunction 05/23/2010  . DDD (degenerative disc disease), lumbar    seeing Dr. ElsEllene Route Depression 02/03/2007  . GERD 11/12/2006  . H/O iron deficiency anemia 05/09/2007   per pt from grastic bypass  . Hyperlipemia 11/12/2006  . Low back pain 02/03/2007  . Neck mass 01/03/2007  . Osteoporosis   . UTI (lower urinary tract infection) 11/12/2006  . Vestibular neuritis   . Warts 01/07/2010   hands    MEDICATIONS: Current Outpatient Prescriptions on File Prior to Visit  Medication Sig Dispense Refill  . Cholecalciferol (VITAMIN D3) 5000 UNITS TABS Take 1 capsule by mouth daily. 30 tablet   . desvenlafaxine (PRISTIQ) 50 MG 24 hr tablet Take 1 tablet (50 mg total) by mouth daily. 90 tablet 3  . gabapentin (NEURONTIN) 100 MG capsule take 1 capsule at night 90 capsule 3  . topiramate (TOPAMAX) 100 MG tablet TAKE 1 TABLET(100 MG) BY MOUTH AT BEDTIME 30 tablet 0  . vitamin B-12 (CYANOCOBALAMIN) 250 MCG tablet Take 250 mcg by mouth daily.    .Marland Kitchen  nitrofurantoin, macrocrystal-monohydrate, (MACROBID) 100 MG capsule Take 1 capsule (100 mg total) by mouth 2 (two) times daily. (Patient not taking: Reported on 05/07/2016) 14 capsule 0   No current facility-administered medications on file prior to visit.     ALLERGIES: No Known Allergies  FAMILY HISTORY: Family History  Problem Relation Age of Onset  . Heart attack Father   . Other Mother     bilateral mastectomies at 45y due to precancerous tumors  . Breast cancer Maternal Aunt     dx. early 83s  . Heart Problems Maternal Aunt   . Lung cancer Maternal Uncle     dx. 62-62; former smoker   . Lung cancer Maternal Grandmother     dx. 50s; not a smoker; "metastasis to colon"  . Brain cancer Other     dx. >50s  . Cancer Other     dx. <50s; unknown cancer/dx. 15s; unknown type    SOCIAL HISTORY: Social History   Social History  . Marital status: Married    Spouse name: N/A  . Number of children: N/A  . Years of education: N/A   Occupational History  . Not on file.   Social History Main Topics  . Smoking status: Never Smoker  . Smokeless tobacco: Never Used  . Alcohol use 1.2 oz/week    2 Glasses of wine per week     Comment: once every two weeks   . Drug use: No  . Sexual activity: Yes    Partners: Male    Birth control/ protection: Surgical   Other Topics Concern  . 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      Home Situation: lives with husband      Spiritual Beliefs: Jewish      Lifestyle: regular exercise 2-4 days per week; trying to eat healthy          REVIEW OF SYSTEMS: Constitutional: No fevers, chills, or sweats, no generalized fatigue, change in appetite Eyes: No visual changes, double vision, eye pain Ear, nose and throat: No hearing loss, ear pain, nasal congestion, sore throat Cardiovascular: No chest pain, palpitations Respiratory:  No shortness of breath at rest or with exertion, wheezes GastrointestinaI: No nausea, vomiting, diarrhea, abdominal pain, fecal incontinence Genitourinary:  No dysuria, urinary retention or frequency Musculoskeletal:  No neck pain, back pain Integumentary: No rash, pruritus, skin lesions Neurological: as above Psychiatric: No depression, insomnia, anxiety Endocrine: No palpitations, fatigue, diaphoresis, mood swings, change in appetite, change in weight, increased thirst Hematologic/Lymphatic:  No purpura, petechiae. Allergic/Immunologic: no itchy/runny eyes, nasal congestion, recent allergic reactions, rashes  PHYSICAL EXAM: Vitals:   05/07/16  0918  BP: 118/76  Pulse: 86   General: No acute distress.  Patient appears well-groomed.  normal body habitus. Head:  Normocephalic/atraumatic Eyes:  Fundi examined but not visualized Neck: supple, no paraspinal tenderness, full range of motion Heart:  Regular rate and rhythm Lungs:  Clear to auscultation bilaterally Back: No paraspinal tenderness Neurological Exam: alert and oriented to person, place, and time. Attention span and concentration intact, recent and remote memory intact, fund of knowledge intact.  Speech fluent and not dysarthric, language intact.  CN II-XII intact. Bulk and tone normal, muscle strength 5/5 throughout.  Sensation to light touch  intact.  Deep tendon reflexes 2+ throughout.  Finger to nose testing intact.  Gait normal  IMPRESSION: Vestibular migraine  PLAN: Topiramate 154m at bedtime for preventative management Diazepam and naproxen  for abortive/acute management Follow up in 9 months or as needed.  15 minutes spent face to face with patient, over 50% spent discussing current status and management.  Metta Clines, DO  CC:  Maury Dus, MD

## 2016-05-07 NOTE — Patient Instructions (Signed)
1.  Continue topiramate 100mg  at bedtime 2.  For dizzy spells/migraine, take diazepam as needed for dizziness and naproxen for headache 3.  Follow up in 9 months or as needed.

## 2016-05-31 ENCOUNTER — Other Ambulatory Visit: Payer: Self-pay | Admitting: Neurology

## 2016-07-01 ENCOUNTER — Other Ambulatory Visit: Payer: Self-pay | Admitting: Neurology

## 2016-08-05 ENCOUNTER — Other Ambulatory Visit: Payer: Self-pay | Admitting: Obstetrics and Gynecology

## 2016-08-05 DIAGNOSIS — Z1231 Encounter for screening mammogram for malignant neoplasm of breast: Secondary | ICD-10-CM

## 2016-09-18 ENCOUNTER — Ambulatory Visit
Admission: RE | Admit: 2016-09-18 | Discharge: 2016-09-18 | Disposition: A | Discharge status: Home / Self Care | Payer: BC Managed Care – PPO | Source: Ambulatory Visit | Attending: Obstetrics and Gynecology | Admitting: Obstetrics and Gynecology

## 2016-09-18 DIAGNOSIS — Z1231 Encounter for screening mammogram for malignant neoplasm of breast: Secondary | ICD-10-CM

## 2016-09-18 HISTORY — DX: Personal history of irradiation: Z92.3

## 2016-10-03 ENCOUNTER — Other Ambulatory Visit: Payer: Self-pay | Admitting: Neurology

## 2016-11-26 ENCOUNTER — Encounter: Payer: Self-pay | Admitting: Neurology

## 2016-12-01 ENCOUNTER — Encounter: Payer: Self-pay | Admitting: Neurology

## 2016-12-03 MED ORDER — TOPIRAMATE 100 MG PO TABS: 150.0000 mg | ORAL_TABLET | Freq: Every day | ORAL | 2 refills | Status: DC

## 2016-12-28 ENCOUNTER — Ambulatory Visit (HOSPITAL_BASED_OUTPATIENT_CLINIC_OR_DEPARTMENT_OTHER): Payer: BC Managed Care – PPO | Attending: Internal Medicine | Admitting: Internal Medicine

## 2016-12-28 VITALS — Ht 62.0 in | Wt 147.0 lb

## 2016-12-28 DIAGNOSIS — G4719 Other hypersomnia: Secondary | ICD-10-CM | POA: Diagnosis present

## 2016-12-28 DIAGNOSIS — G478 Other sleep disorders: Secondary | ICD-10-CM | POA: Insufficient documentation

## 2016-12-28 DIAGNOSIS — R0683 Snoring: Secondary | ICD-10-CM | POA: Insufficient documentation

## 2016-12-29 ENCOUNTER — Other Ambulatory Visit (HOSPITAL_BASED_OUTPATIENT_CLINIC_OR_DEPARTMENT_OTHER): Payer: Self-pay

## 2016-12-29 DIAGNOSIS — R0683 Snoring: Secondary | ICD-10-CM

## 2016-12-29 DIAGNOSIS — G4710 Hypersomnia, unspecified: Secondary | ICD-10-CM

## 2016-12-29 DIAGNOSIS — R5383 Other fatigue: Secondary | ICD-10-CM

## 2017-01-02 NOTE — Procedures (Signed)
  Last, First: Anna Navarro, Anna Navarro MRN: [id number] Gender: Female Age (years): 24 Weight (lbs): 147 DOB: 10-May-1956 BMI: 27 Primary Care: Maury Dus Epworth Score: 3 Referring: Carlena Sax Technician: Rosana Hoes, [place] Interpreting: Carlena Sax Study Type: NPSG Ordered Study Type: NPSG Study date: 12/28/2016 Location: [place]  CLINICAL INFORMATION The patient was referred to the sleep center for evaluation of possible Sleep Apnea, seen on a HSAT. She has non-restorative sleep, excessive daytime sleepiness by her report, and snoring.  MEDICATIONS Patient self administered medications include: N/A. Medications administered during study include No sleep medicine administered.Marland Kitchen  SLEEP STUDY TECHNIQUE A multi-channel overnight Polysomnography study was performed. The channels recorded and monitored were central and occipital EEG, electrooculogram (EOG), submentalis EMG (chin), nasal and oral airflow, thoracic and abdominal wall motion, anterior tibialis EMG, snore microphone, electrocardiogram, and a pulse oximetry.  TECHNICAL COMMENTS Comments added by Technician: BATHROOM BREAKS 1X. PLMS NOTED. Patient had difficulty initiating sleep.  Comments added by Scorer: N/A  SLEEP ARCHITECTURE The study was initiated at 10:08:07 PM and terminated at 4:17:14 AM. The total recorded time was 369.1 minutes. EEG confirmed total sleep time was 226.0 minutes yielding a sleep efficiency of 61.2%. Sleep onset after lights out was 28.7 minutes with a REM latency of 237.5 minutes. The patient spent 5.53% of the night in stage N1 sleep, 81.41% in stage N2 sleep, 4.20% in stage N3 and 8.85% in REM. Wake after sleep onset (WASO) was 114.5 minutes. The Arousal Index was 2.4/hour.  RESPIRATORY PARAMETERS There were a total of 4 respiratory disturbances out of which 1 were apneas ( 1 obstructive, 0 mixed, 0 central) and 3 hypopneas. The apnea/hypopnea index (AHI) was 1.1 events/hour. Her RDI was 1.9/hr. The  central sleep apnea index was 0.0 events/hour. The REM AHI was 0.0 events/hour and NREM AHI was 1.2 events/hour. The supine AHI was 1.6 events/hour and the non supine AHI was 0.79 supine during 33.17% of sleep. Respiratory disturbances were associated with oxygen desaturation down to a nadir of 89.00% during sleep. The mean oxygen saturation during the study was 94.34%. The cumulative time under 88% oxygen saturation was 0.0 minutes.  LEG MOVEMENT DATA The total leg movements were with a resulting leg movement index of 45/hr. Associated arousal with leg movement index was 0.5/hr.  CARDIAC DATA The underlying cardiac rhythm was most consistent with sinus rhythm. Mean heart rate during sleep was 66.44 bpm. Additional rhythm abnormalities include None.  IMPRESSIONS There is no evidence of significant sleep disordered breathing. The HSAT performed previously was likely a false positive study Leg movements are seen, few to arousal.  DIAGNOSIS - Normal study  RECOMMENDATIONS There is no indication for the treatment of sleep disordered breathing  [place], American Board of Sleep Medicine  ELECTRONICALLY SIGNED ON:  01/02/2017, 9:08 PM [place] PH: [phone]   FX: [phone] [place]

## 2017-01-26 ENCOUNTER — Ambulatory Visit: Payer: BC Managed Care – PPO | Admitting: Neurology

## 2017-03-04 ENCOUNTER — Ambulatory Visit (INDEPENDENT_AMBULATORY_CARE_PROVIDER_SITE_OTHER): Payer: BC Managed Care – PPO | Admitting: Obstetrics and Gynecology

## 2017-03-04 ENCOUNTER — Encounter: Payer: Self-pay | Admitting: Obstetrics and Gynecology

## 2017-03-04 VITALS — BP 118/78 | HR 76 | Resp 16 | Ht 61.0 in | Wt 152.0 lb

## 2017-03-04 DIAGNOSIS — Z853 Personal history of malignant neoplasm of breast: Secondary | ICD-10-CM

## 2017-03-04 DIAGNOSIS — N941 Unspecified dyspareunia: Secondary | ICD-10-CM | POA: Diagnosis not present

## 2017-03-04 DIAGNOSIS — M8580 Other specified disorders of bone density and structure, unspecified site: Secondary | ICD-10-CM | POA: Diagnosis not present

## 2017-03-04 DIAGNOSIS — E2839 Other primary ovarian failure: Secondary | ICD-10-CM

## 2017-03-04 DIAGNOSIS — N952 Postmenopausal atrophic vaginitis: Secondary | ICD-10-CM | POA: Diagnosis not present

## 2017-03-04 DIAGNOSIS — Z01419 Encounter for gynecological examination (general) (routine) without abnormal findings: Secondary | ICD-10-CM | POA: Diagnosis not present

## 2017-03-04 MED ORDER — ESTRADIOL 10 MCG VA TABS: 1.0000 | ORAL_TABLET | VAGINAL | 1 refills | Status: DC

## 2017-03-04 NOTE — Progress Notes (Signed)
61 y.o. [id number] MarriedCaucasianF here for annual exam.  No vaginal bleeding.  H/O right breast cancer (ER+). H/O TAH.  She is having pain with intercourse, dryness, using a lubricant, but it's not enough. I reached out the her Oncologist, he is okay with her taking vaginal estrogen as long as she is aware of limited data and possible risk.     No LMP recorded. Patient has had a hysterectomy.          Sexually active: Yes.    The current method of family planning is status post hysterectomy.    Exercising: Yes.    walking Smoker:  no  Health Maintenance: Pap:  2006 History of abnormal Pap:  no MMG:  09/18/16 BIRADS 1 negative/density b Colonoscopy:  2009 Normal per patient  BMD:   09/2015 Osteopenia. FRAX 16.3 and 2.2% TDaP:  UTD  Gardasil: N/A   reports that she has never smoked. She has never used smokeless tobacco. She reports that she drinks about 1.2 oz of alcohol per week . She reports that she does not use drugs. Drinks socially. 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  Past Medical History:  Diagnosis Date  . Allergic rhinitis 02/03/2007  . Breast cancer ([place]) 2009   IDC of UOQ of right breast; ER/PR+, Her2-, Ki67 10%; s/p lumpectomy and radiation 2009  . Chronic eustachian tube dysfunction 05/23/2010  . DDD (degenerative disc disease), lumbar    seeing Dr. Ellene Route  . Depression 02/03/2007  . GERD 11/12/2006  . H/O iron deficiency anemia 05/09/2007   per pt from grastic bypass  . Hyperlipemia 11/12/2006  . Low back pain 02/03/2007  . Neck mass 01/03/2007  . Osteoporosis   . Personal history of radiation therapy 2009  . UTI (lower urinary tract infection) 11/12/2006  . Vestibular neuritis   . Warts 01/07/2010   hands    Past Surgical History:  Procedure Laterality Date  . BREAST BIOPSY Right 2009   High Risk Stereo Biopsy   . BREAST LUMPECTOMY Right 2009  . BREAST LUMPECTOMY WITH AXILLARY LYMPH NODE BIOPSY Right 08/2007   RT, ER/PR+, T1N0  .  GASTRIC BYPASS    . TOTAL ABDOMINAL HYSTERECTOMY      Current Outpatient Prescriptions  Medication Sig Dispense Refill  . Cholecalciferol (VITAMIN D3) 5000 UNITS TABS Take 1 capsule by mouth daily. 30 tablet   . desvenlafaxine (PRISTIQ) 50 MG 24 hr tablet Take 1 tablet (50 mg total) by mouth daily. 90 tablet 3  . gabapentin (NEURONTIN) 100 MG capsule take 1 capsule at night 90 capsule 3  . omeprazole (PRILOSEC) 20 MG capsule Take 20 mg by mouth daily.    Marland Kitchen topiramate (TOPAMAX) 100 MG tablet Take 1.5 tablets (150 mg total) by mouth daily. 45 tablet 2  . vitamin B-12 (CYANOCOBALAMIN) 250 MCG tablet Take 250 mcg by mouth daily.     No current facility-administered medications for this visit.     Family History  Problem Relation Age of Onset  . Heart attack Father   . Other Mother        bilateral mastectomies at 45y due to precancerous tumors  . Breast cancer Mother   . Breast cancer Maternal Aunt        dx. early 44s  . Heart Problems Maternal Aunt   . Lung cancer Maternal Uncle        dx. 62-62; former smoker  . Lung cancer Maternal Grandmother  dx. 70s; not a smoker; "metastasis to colon"  . Brain cancer Other        dx. >50s  . Cancer Other        dx. <50s; unknown cancer/dx. 67s; unknown type    Review of Systems  Constitutional: Negative.   HENT: Negative.   Eyes: Negative.   Respiratory: Negative.   Cardiovascular: Negative.   Gastrointestinal: Negative.   Endocrine: Negative.   Genitourinary: Negative.   Musculoskeletal: Negative.   Skin: Negative.   Allergic/Immunologic: Negative.   Neurological: Negative.   Hematological: Negative.   Psychiatric/Behavioral: Negative.     Exam:   BP 118/78 (BP Location: Left Arm, Patient Position: Sitting, Cuff Size: Large)   Pulse 76   Resp 16   Ht 5' 1"  (1.549 m)   Wt 152 lb (68.9 kg)   BMI 28.72 kg/m   Weight change: @WEIGHTCHANGE @ Height:   Height: 5' 1"  (154.9 cm)  Ht Readings from Last 3 Encounters:   03/04/17 5' 1"  (1.549 m)  12/28/16 5' 2"  (1.575 m)  05/07/16 5' 1"  (1.549 m)    General appearance: alert, cooperative and appears stated age Head: Normocephalic, without obvious abnormality, atraumatic Neck: no adenopathy, supple, symmetrical, trachea midline and thyroid normal to inspection and palpation Lungs: clear to auscultation bilaterally Cardiovascular: regular rate and rhythm Breasts: normal appearance, no masses or tenderness, evidence of right lumpectomy Abdomen: soft, non-tender; non distended,  no masses,  no organomegaly Extremities: extremities normal, atraumatic, no cyanosis or edema Skin: Skin color, texture, turgor normal. No rashes or lesions Lymph nodes: Cervical, supraclavicular, and axillary nodes normal. No abnormal inguinal nodes palpated Neurologic: Grossly normal   Pelvic: External genitalia:  no lesions              Urethra:  normal appearing urethra with no masses, tenderness or lesions              Bartholins and Skenes: normal                 Vagina: normal appearing atrophic vagina with normal color and discharge, no lesions  Able to insert 2 fingers with no significant discomfort              Cervix: absent               Bimanual Exam:  Uterus:  uterus absent              Adnexa: no mass, fullness, tenderness               Rectovaginal: Confirms               Anus:  normal sphincter tone, no lesions  Chaperone was present for exam.  A:  Well Woman with normal exam  Osteopenia  Vaginal atrophy  Dyspareunia  H/O breast cancer  P:   Will start vagifem 2 x a week  F/U in one month  She is aware of the possible increase risk in breast cancer  Use lubricant if sexually active  No pap is needed  Labs with primary MD  DEXA in 5/19  Discussed breast self exam  Discussed calcium and vit D intake

## 2017-03-04 NOTE — Patient Instructions (Signed)

## 2017-03-06 ENCOUNTER — Other Ambulatory Visit: Payer: Self-pay | Admitting: Neurology

## 2017-03-29 ENCOUNTER — Encounter: Payer: Self-pay | Admitting: Neurology

## 2017-03-29 ENCOUNTER — Ambulatory Visit: Payer: BC Managed Care – PPO | Admitting: Neurology

## 2017-03-29 VITALS — BP 134/86 | HR 70 | Ht 61.5 in | Wt 151.0 lb

## 2017-03-29 DIAGNOSIS — G43809 Other migraine, not intractable, without status migrainosus: Secondary | ICD-10-CM

## 2017-03-29 DIAGNOSIS — G43109 Migraine with aura, not intractable, without status migrainosus: Secondary | ICD-10-CM

## 2017-03-29 MED ORDER — TOPIRAMATE 100 MG PO TABS: 200.0000 mg | ORAL_TABLET | Freq: Every day | ORAL | 3 refills | Status: DC

## 2017-03-29 NOTE — Progress Notes (Signed)
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   UPDATE: Last year, we increased topiramate to 136m daily.  There has been no change in headache.  They last all day and occur once a month.. Abortive therapy:  Naproxen 5057mfor headache and diazepam 2.74m57mp to every 6 hours as needed for vertigo.   Preventative therapy:  topiramate 150m89mHISTORY: She began having recurrent episodes of dizziness in March 2015.  She describes it as a sensation of movement, like during a drop on a roller coaster, as well as spinning.  It occurs spontaneously and at rest, but can be exacerbated with movement.  Sometimes it is so severe, she was unable to drive and needed her husband to come and pick her up.  She may feel a little queasy, but she denies any significant nausea and no vomiting.  She does endorse photophobia, phonophobia and osmophobia.  She denies lightheadedness, vision loss, focal numbness or weakness, or slurred speech. It is associated with a dull, 6/10 non-throbbing holocephalic ache.  There are no known triggers.  Only rest and sleep help.  Initially, at time of her first visit, t typically lasts 2-3 days and occurred once a week to once every other week.  There are no specific relieving factors.  She has taken meclizine, which is not too effective.  Even though the spells are episodic, she didn't quite feel right in between spells.   She was evaluated by Dr. KrauThornell Mulethe Ear Houghtonn ENG showed central findings.  She had an MRI of the brain with and without contrast, which was reportedly normal.   She denies preceding viral illness or head trauma.  She denies prior history of migraine.  She denies family history of migraine.  PAST MEDICAL  HISTORY: Past Medical History:  Diagnosis Date  . Allergic rhinitis 02/03/2007  . Breast cancer ([facility])[place]09   IDC of UOQ of right breast; ER/PR+, Her2-, Ki67 10%; s/p lumpectomy and radiation 2009  . Chronic eustachian tube dysfunction 05/23/2010  . DDD (degenerative disc disease), lumbar    seeing Dr. ElsnEllene RouteDepression 02/03/2007  . GERD 11/12/2006  . H/O iron deficiency anemia 05/09/2007   per pt from grastic bypass  . Hyperlipemia 11/12/2006  . Low back pain 02/03/2007  . Neck mass 01/03/2007  . Osteoporosis   . Personal history of radiation therapy 2009  . UTI (lower urinary tract infection) 11/12/2006  . Vestibular neuritis   . Warts 01/07/2010   hands    MEDICATIONS: Current Outpatient Medications on File Prior to Visit  Medication Sig Dispense Refill  . Cholecalciferol (VITAMIN D3) 5000 UNITS TABS Take 1 capsule by mouth daily. 30 tablet   . desvenlafaxine (PRISTIQ) 50 MG 24 hr tablet Take 1 tablet (50 mg total) by mouth daily. 90 tablet 3  . Estradiol 10 MCG TABS vaginal tablet Place 1 tablet (10 mcg total) vaginally 2 (two) times a week. 8 tablet 1  . gabapentin (NEURONTIN) 100 MG capsule take 1 capsule at night 90 capsule 3  . omeprazole (PRILOSEC) 20 MG capsule Take 20 mg by mouth daily.    . vitamin B-12 (CYANOCOBALAMIN) 250 MCG tablet Take 250 mcg by mouth daily.     No current facility-administered  medications on file prior to visit.     ALLERGIES: No Known Allergies  FAMILY HISTORY: Family History  Problem Relation Age of Onset  . Heart attack Father   . Other Mother        bilateral mastectomies at 45y due to precancerous tumors  . Breast cancer Mother   . Breast cancer Maternal Aunt        dx. early 60s  . Heart Problems Maternal Aunt   . Lung cancer Maternal Uncle        dx. 62-62; former smoker  . Lung cancer Maternal Grandmother        dx. 22s; not a smoker; "metastasis to colon"  . Brain cancer Other        dx. >50s  . Cancer Other        dx.  <50s; unknown cancer/dx. 54s; unknown type    SOCIAL HISTORY: Social History   Socioeconomic History  . Marital status: Married    Spouse name: Not on file  . Number of children: Not on file  . Years of education: Not on file  . Highest education level: Not on file  Social Needs  . Financial resource strain: Not on file  . Food insecurity - worry: Not on file  . Food insecurity - inability: Not on file  . Transportation needs - medical: Not on file  . Transportation needs - non-medical: Not on file  Occupational History  . Not on file  Tobacco Use  . Smoking status: Never Smoker  . Smokeless tobacco: Never Used  Substance and Sexual Activity  . Alcohol use: Yes    Alcohol/week: 1.2 oz    Types: 2 Glasses of wine per week    Comment: once every two weeks   . Drug use: No  . Sexual activity: Yes    Partners: Male    Birth control/protection: Surgical  Other Topics Concern  . 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      Home Situation: lives with husband      Spiritual Beliefs: Jewish      Lifestyle: regular exercise 2-4 days per week; trying to eat healthy       REVIEW OF SYSTEMS: Constitutional: No fevers, chills, or sweats, no generalized fatigue, change in appetite Eyes: No visual changes, double vision, eye pain Ear, nose and throat: No hearing loss, ear pain, nasal congestion, sore throat Cardiovascular: No chest pain, palpitations Respiratory:  No shortness of breath at rest or with exertion, wheezes GastrointestinaI: No nausea, vomiting, diarrhea, abdominal pain, fecal incontinence Genitourinary:  No dysuria, urinary retention or frequency Musculoskeletal:  No neck pain, back pain Integumentary: No rash, pruritus, skin lesions Neurological: as above Psychiatric: No depression, insomnia, anxiety Endocrine: No palpitations, fatigue, diaphoresis, mood swings, change in appetite, change in weight, increased  thirst Hematologic/Lymphatic:  No purpura, petechiae. Allergic/Immunologic: no itchy/runny eyes, nasal congestion, recent allergic reactions, rashes  PHYSICAL EXAM: Vitals:   03/29/17 1121  BP: 134/86  Pulse: 70  SpO2: 97%   General: No acute distress.  Patient appears well-groomed.  normal body habitus. Head:  Normocephalic/atraumatic Eyes:  Fundi examined but not visualized Neck: supple, no paraspinal tenderness, full range of motion Heart:  Regular rate and rhythm Lungs:  Clear to auscultation bilaterally Back: No paraspinal tenderness Neurological Exam: alert and oriented to person, place, and time. Attention span and concentration intact, recent and remote memory intact, fund of knowledge intact.  Speech  fluent and not dysarthric, language intact.  CN II-XII intact. Bulk and tone normal, muscle strength 5/5 throughout.  Sensation to light touch  intact.  Deep tendon reflexes 2+ throughout.  Finger to nose testing intact.  Gait normal  IMPRESSION: Vestibular migraine.  Once a month may be the best we can achieve, which is much better than prior to seeing me.  PLAN: 1.  We will try to achieve decreased frequency by increasing topiramate to 21m daily.  If no improvement, I would taper back down to 1054mdaily (since 15064midn't make a difference either). 2.  Diazepam as needed.   3.  Follow up in 9 months.  AdaMetta ClinesO  CC: RobMaury DusD

## 2017-03-31 ENCOUNTER — Other Ambulatory Visit: Payer: Self-pay

## 2017-03-31 ENCOUNTER — Encounter: Payer: Self-pay | Admitting: Obstetrics and Gynecology

## 2017-03-31 ENCOUNTER — Ambulatory Visit (INDEPENDENT_AMBULATORY_CARE_PROVIDER_SITE_OTHER): Payer: BC Managed Care – PPO | Admitting: Obstetrics and Gynecology

## 2017-03-31 VITALS — BP 132/86 | HR 76 | Resp 14 | Wt 153.0 lb

## 2017-03-31 DIAGNOSIS — N952 Postmenopausal atrophic vaginitis: Secondary | ICD-10-CM

## 2017-03-31 DIAGNOSIS — N941 Unspecified dyspareunia: Secondary | ICD-10-CM | POA: Diagnosis not present

## 2017-03-31 MED ORDER — ESTRADIOL 10 MCG VA TABS: 1.0000 | ORAL_TABLET | VAGINAL | 3 refills | Status: DC

## 2017-03-31 NOTE — Progress Notes (Signed)
GYNECOLOGY  VISIT   HPI: 61 y.o.   Married  Caucasian  female   [phone] with No LMP recorded. Patient has had a hysterectomy.   here for follow up on estradiol. The patient has been having issues with dyspareunia and vaginal atrophy. She has a h/o breast cancer in 2014. I discussed the patient with her Oncologist who was okay with her taking the vaginal estrogen as long as she understood the limited data and potential risk. After discussion she decided to start the vaginal estrogen last month. She feels better, less dry, very minimal discomfort with intercourse (has only had intercourse one time).   GYNECOLOGIC HISTORY: No LMP recorded. Patient has had a hysterectomy. Contraception:hysterectomy  Menopausal hormone therapy: none        OB History    Gravida Para Term Preterm AB Living   _0 SAB TAB Ectopic Multiple Live Births                     Patient Active Problem List   Diagnosis Date Noted  . Genetic testing 01/28/2015  . Rhinitis, allergic 11/02/2014  . Hyperlipemia 11/02/2014  . 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 History of breast cancer 08/18/2012  . GERD 11/12/2006    Past Medical History:  Diagnosis Date  . Allergic rhinitis 02/03/2007  . Breast cancer ([place]) 2009   IDC of UOQ of right breast; ER/PR+, Her2-, Ki67 10%; s/p lumpectomy and radiation 2009  . Chronic eustachian tube dysfunction 05/23/2010  . DDD (degenerative disc disease), lumbar    seeing Dr. Ellene Route  . Depression 02/03/2007  . GERD 11/12/2006  . H/O iron deficiency anemia 05/09/2007   per pt from grastic bypass  . Hyperlipemia 11/12/2006  . Low back pain 02/03/2007  . Neck mass 01/03/2007  . Osteoporosis   . Personal history of radiation therapy 2009  . UTI (lower urinary tract infection) 11/12/2006  . Vestibular neuritis   . Warts 01/07/2010   hands    Past Surgical History:  Procedure Laterality Date  . BREAST BIOPSY Right 2009   High Risk Stereo Biopsy   . BREAST LUMPECTOMY Right 2009   . BREAST LUMPECTOMY WITH AXILLARY LYMPH NODE BIOPSY Right 08/2007   RT, ER/PR+, T1N0  . GASTRIC BYPASS    . TOTAL ABDOMINAL HYSTERECTOMY      Current Outpatient Medications  Medication Sig Dispense Refill  . Cholecalciferol (VITAMIN D3) 5000 UNITS TABS Take 1 capsule by mouth daily. 30 tablet   . desvenlafaxine (PRISTIQ) 50 MG 24 hr tablet Take 1 tablet (50 mg total) by mouth daily. 90 tablet 3  . Estradiol 10 MCG TABS vaginal tablet Place 1 tablet (10 mcg total) vaginally 2 (two) times a week. 8 tablet 1  . gabapentin (NEURONTIN) 100 MG capsule take 1 capsule at night 90 capsule 3  . omeprazole (PRILOSEC) 20 MG capsule Take 20 mg by mouth daily.    Marland Kitchen topiramate (TOPAMAX) 100 MG tablet Take 2 tablets (200 mg total) at bedtime by mouth. 180 tablet 3  . vitamin B-12 (CYANOCOBALAMIN) 250 MCG tablet Take 250 mcg by mouth daily.     No current facility-administered medications for this visit.      ALLERGIES: Patient has no known allergies.  Family History  Problem Relation Age of Onset  . Heart attack Father   . Other Mother        bilateral mastectomies at 45y due to precancerous tumors  . Breast  cancer Mother   . Breast cancer Maternal Aunt        dx. early 8s  . Heart Problems Maternal Aunt   . Lung cancer Maternal Uncle        dx. 62-62; former smoker  . Lung cancer Maternal Grandmother        dx. 83s; not a smoker; "metastasis to colon"  . Brain cancer Other        dx. >50s  . Cancer Other        dx. <50s; unknown cancer/dx. 79s; unknown type    Social History   Socioeconomic History  . Marital status: Married    Spouse name: Not on file  . Number of children: Not on file  . Years of education: Not on file  . Highest education level: Not on file  Social Needs  . Financial resource strain: Not on file  . Food insecurity - worry: Not on file  . Food insecurity - inability: Not on file  . Transportation needs - medical: Not on file  . Transportation needs -  non-medical: Not on file  Occupational History  . Not on file  Tobacco Use  . Smoking status: Never Smoker  . Smokeless tobacco: Never Used  Substance and Sexual Activity  . Alcohol use: Yes    Alcohol/week: 1.2 oz    Types: 2 Glasses of wine per week    Comment: once every two weeks   . Drug use: No  . Sexual activity: Yes    Partners: Male    Birth control/protection: Surgical  Other Topics Concern  . 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   HENT: Negative.   Eyes: Negative.   Respiratory: Negative.   Cardiovascular: Negative.   Gastrointestinal: Negative.   Genitourinary: Negative.   Musculoskeletal: Negative.   Skin: Negative.   Neurological: Negative.   Endo/Heme/Allergies: Negative.   Psychiatric/Behavioral: Negative.     PHYSICAL EXAMINATION:    BP 132/86 (BP Location: Right Arm, Patient Position: Sitting, Cuff Size: Normal)   Pulse 76   Resp 14   Wt 153 lb (69.4 kg)   BMI 28.44 kg/m     General appearance: alert, cooperative and appears stated age  Pelvic: External genitalia:  no lesions              Urethra:  normal appearing urethra with no masses, tenderness or lesions              Bartholins and Skenes: normal                 Vagina: normal appearing vagina with very mild atrophy (improved), able to comfortably insert 2 fingers vaginally              Cervix: absent              Bimanual Exam:  Uterus:  uterus absent              Adnexa: no mass, fullness, tenderness              Chaperone was present for exam.  ASSESSMENT Vaginal atrophy and Dyspareunia, markedly improved with vaginal estrogen    PLAN  Will continue vaginal estrogen Call with any concerns F/U in 10/19 for an annual exam    An After Visit Summary was printed and given to the patient.

## 2017-08-06 ENCOUNTER — Other Ambulatory Visit: Payer: Self-pay | Admitting: Obstetrics and Gynecology

## 2017-08-06 DIAGNOSIS — Z1231 Encounter for screening mammogram for malignant neoplasm of breast: Secondary | ICD-10-CM

## 2017-09-09 ENCOUNTER — Encounter: Payer: Self-pay | Admitting: Neurology

## 2017-10-01 ENCOUNTER — Ambulatory Visit
Admission: RE | Admit: 2017-10-01 | Discharge: 2017-10-01 | Disposition: A | Discharge status: Home / Self Care | Payer: BC Managed Care – PPO | Source: Ambulatory Visit | Attending: Obstetrics and Gynecology | Admitting: Obstetrics and Gynecology

## 2017-10-01 DIAGNOSIS — E2839 Other primary ovarian failure: Secondary | ICD-10-CM

## 2017-10-01 DIAGNOSIS — Z1231 Encounter for screening mammogram for malignant neoplasm of breast: Secondary | ICD-10-CM

## 2017-10-01 DIAGNOSIS — M8580 Other specified disorders of bone density and structure, unspecified site: Secondary | ICD-10-CM

## 2017-10-14 ENCOUNTER — Telehealth: Payer: Self-pay | Admitting: *Deleted

## 2017-10-14 NOTE — Telephone Encounter (Signed)
Notes recorded by Burnice Logan, RN on 10/14/2017 at 4:40 PM EDT Left message to call Sharee Pimple at [phone].  ------  Notes recorded by Burnice Logan, RN on 10/14/2017 at 4:38 PM EDT Frax calculation 3.6% within next 10 yrs. Reviewed with Dr. Talbert Nan, OV recommended to discuss treatment.

## 2017-10-14 NOTE — Telephone Encounter (Signed)
-----   Message from [name], MD sent at 10/05/2017  7:36 PM EDT ----- This patient is not on HRT, she is only using a small amount of vaginal estrogen. Please ask the Radiologist to run a FRAX calculation.   The following mychart message was sent  Hi Anna Navarro, You continue to have osteopenia. There has been a decrease in your bone density of your spine and your left hip. I'm requesting that radiology calculate your risk of a fracture to see if you meet criteria for treatment. I will get back to you. Sumner Boast

## 2017-10-14 NOTE — Telephone Encounter (Addendum)
Frax Calculation returned to Dr. Talbert Nan.   Notes recorded by Burnice Logan, RN on 10/14/2017 at 4:44 PM EDT Addendum to previous entry: Frax Calculation Hip fracture 3.6 % within the next 10 yrs. Major osteoporotic fracture 19.3% within next 10 yrs.

## 2017-10-15 NOTE — Telephone Encounter (Signed)
Spoke with patient, advised as seen below. OV scheduled for BMD consult on 6/20 at 11:30 am with Dr. Talbert Nan.   Routing to provider for final review. Patient is agreeable to disposition. Will close encounter.

## 2017-11-04 ENCOUNTER — Telehealth: Payer: Self-pay | Admitting: Obstetrics and Gynecology

## 2017-11-04 ENCOUNTER — Ambulatory Visit: Payer: BC Managed Care – PPO | Admitting: Obstetrics and Gynecology

## 2017-11-04 ENCOUNTER — Encounter: Payer: Self-pay | Admitting: Obstetrics and Gynecology

## 2017-11-04 ENCOUNTER — Other Ambulatory Visit: Payer: Self-pay

## 2017-11-04 VITALS — BP 128/80 | HR 80 | Temp 98.2°F | Resp 16 | Wt 151.0 lb

## 2017-11-04 DIAGNOSIS — R300 Dysuria: Secondary | ICD-10-CM | POA: Diagnosis not present

## 2017-11-04 DIAGNOSIS — M8580 Other specified disorders of bone density and structure, unspecified site: Secondary | ICD-10-CM

## 2017-11-04 LAB — POCT URINALYSIS DIPSTICK
Bilirubin, UA: NEGATIVE
Blood, UA: NEGATIVE
Glucose, UA: NEGATIVE
Ketones, UA: NEGATIVE
Nitrite, UA: NEGATIVE
Protein, UA: NEGATIVE
pH, UA: 6 (ref 5.0–8.0)

## 2017-11-04 MED ORDER — PHENAZOPYRIDINE HCL 200 MG PO TABS: 200.0000 mg | ORAL_TABLET | Freq: Three times a day (TID) | ORAL | 0 refills | Status: DC | PRN

## 2017-11-04 MED ORDER — SULFAMETHOXAZOLE-TRIMETHOPRIM 800-160 MG PO TABS: 1.0000 | ORAL_TABLET | Freq: Two times a day (BID) | ORAL | 0 refills | Status: DC

## 2017-11-04 NOTE — Telephone Encounter (Signed)
Please let the patient know that we found a copy of her op note. She had a Roux-en-Y gastric bypass. She should not be on oral biphosphonates (ie fosamax), contraindicated.  Please also send a copy of her urine culture to her primary, Dr Joneen Caraway

## 2017-11-04 NOTE — Telephone Encounter (Signed)
Spoke with patient and gave information. Patient voiced understanding . -eh

## 2017-11-04 NOTE — Patient Instructions (Signed)
Osteoporosis Osteoporosis is the thinning and loss of density in the bones. Osteoporosis makes the bones more brittle, fragile, and likely to break (fracture). Over time, osteoporosis can cause the bones to become so weak that they fracture after a simple fall. The bones most likely to fracture are the bones in the hip, wrist, and spine. What are the causes? The exact cause is not known. What increases the risk? Anyone can develop osteoporosis. You may be at greater risk if you have a family history of the condition or have poor nutrition. You may also have a higher risk if you are:  Female.  50 years old or older.  A smoker.  Not physically active.  White or Asian.  Slender.  What are the signs or symptoms? A fracture might be the first sign of the disease, especially if it results from a fall or injury that would not usually cause a bone to break. Other signs and symptoms include:  Low back and neck pain.  Stooped posture.  Height loss.  How is this diagnosed? To make a diagnosis, your health care provider may:  Take a medical history.  Perform a physical exam.  Order tests, such as: ? A bone mineral density test. ? A dual-energy X-ray absorptiometry test.  How is this treated? The goal of osteoporosis treatment is to strengthen your bones to reduce your risk of a fracture. Treatment may involve:  Making lifestyle changes, such as: ? Eating a diet rich in calcium. ? Doing weight-bearing and muscle-strengthening exercises. ? Stopping tobacco use. ? Limiting alcohol intake.  Taking medicine to slow the process of bone loss or to increase bone density.  Monitoring your levels of calcium and vitamin D.  Follow these instructions at home:  Include calcium and vitamin D in your diet. Calcium is important for bone health, and vitamin D helps the body absorb calcium.  Perform weight-bearing and muscle-strengthening exercises as directed by your health care  provider.  Do not use any tobacco products, including cigarettes, chewing tobacco, and electronic cigarettes. If you need help quitting, ask your health care provider.  Limit your alcohol intake.  Take medicines only as directed by your health care provider.  Keep all follow-up visits as directed by your health care provider. This is important.  Take precautions at home to lower your risk of falling, such as: ? Keeping rooms well lit and clutter free. ? Installing safety rails on stairs. ? Using rubber mats in the bathroom and other areas that are often wet or slippery. Get help right away if: You fall or injure yourself. This information is not intended to replace advice given to you by your health care provider. Make sure you discuss any questions you have with your health care provider. Document Released: 02/11/2005 Document Revised: 10/07/2015 Document Reviewed: 10/12/2013 Elsevier Interactive Patient Education  2018 Elsevier Inc.  

## 2017-11-04 NOTE — Progress Notes (Signed)
GYNECOLOGY  VISIT   HPI: 62 y.o.   Married  Caucasian  female   [phone] with No LMP recorded. Patient has had a hysterectomy.   here for BMD results. Patient is c/o dysuria x 3 days.  She has had recurrent UTI's with her primary in the last year. Thinks they come every 3-6 months. Not associated with sex. Current symptoms started 3 days ago. She c/o pain in her left flank, then lower back, then lower abdomen. This is always how her UTI's start. Intermittent burning with urination. No urinary frequency, no urgency. No fevers. She states if she doesn't start antibiotics right quickly she gets very sick.   Recent DEXA with low T score in the left femur of -2.4. FRAX calculation was 19.3% of any fracture and 3.6% of a hip fracture (up from 16.3 and 2.2% from 5/17).   She was on fosamax years ago, developed muscle aches. Never went back on it. The DEXA had also improved.   GYNECOLOGIC HISTORY: No LMP recorded. Patient has had a hysterectomy. Contraception:postmenopause  Menopausal hormone therapy: estradiol         OB History    Gravida  2   Para  2   Term  1   Preterm  1   AB      Living  2     SAB      TAB      Ectopic      Multiple      Live Births                 Patient Active Problem List   Diagnosis Date Noted  . Genetic testing 01/28/2015  . Rhinitis, allergic 11/02/2014  . Hyperlipemia 11/02/2014  . 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 History of breast cancer 08/18/2012  . GERD 11/12/2006    Past Medical History:  Diagnosis Date  . Allergic rhinitis 02/03/2007  . Breast cancer ([place]) 2009   IDC of UOQ of right breast; ER/PR+, Her2-, Ki67 10%; s/p lumpectomy and radiation 2009  . Chronic eustachian tube dysfunction 05/23/2010  . DDD (degenerative disc disease), lumbar    seeing Dr. Ellene Route  . Depression 02/03/2007  . GERD 11/12/2006  . H/O iron deficiency anemia 05/09/2007   per pt from grastic bypass  . Hyperlipemia 11/12/2006  . Low back pain 02/03/2007   . Neck mass 01/03/2007  . Osteoporosis   . Personal history of radiation therapy 2009  . UTI (lower urinary tract infection) 11/12/2006  . Vestibular neuritis   . Warts 01/07/2010   hands    Past Surgical History:  Procedure Laterality Date  . BREAST BIOPSY Right 2009   High Risk Stereo Biopsy   . BREAST LUMPECTOMY Right 2009  . BREAST LUMPECTOMY WITH AXILLARY LYMPH NODE BIOPSY Right 08/2007   RT, ER/PR+, T1N0  . GASTRIC BYPASS    . TOTAL ABDOMINAL HYSTERECTOMY      Current Outpatient Medications  Medication Sig Dispense Refill  . Cholecalciferol (VITAMIN D3) 5000 UNITS TABS Take 1 capsule by mouth daily. 30 tablet   . desvenlafaxine (PRISTIQ) 50 MG 24 hr tablet Take 1 tablet (50 mg total) by mouth daily. 90 tablet 3  . Estradiol 10 MCG TABS vaginal tablet Place 1 tablet (10 mcg total) 2 (two) times a week vaginally. 24 tablet 3  . gabapentin (NEURONTIN) 100 MG capsule take 1 capsule at night 90 capsule 3  . omeprazole (PRILOSEC) 20 MG capsule Take 20 mg by mouth daily.    Marland Kitchen  topiramate (TOPAMAX) 100 MG tablet Take 2 tablets (200 mg total) at bedtime by mouth. 180 tablet 3  . vitamin B-12 (CYANOCOBALAMIN) 250 MCG tablet Take 250 mcg by mouth daily.     No current facility-administered medications for this visit.      ALLERGIES: Patient has no known allergies.  Family History  Problem Relation Age of Onset  . Heart attack Father   . Other Mother        bilateral mastectomies at 45y due to precancerous tumors  . Breast cancer Mother   . Breast cancer Maternal Aunt        dx. early 24s  . Heart Problems Maternal Aunt   . Lung cancer Maternal Uncle        dx. 62-62; former smoker  . Lung cancer Maternal Grandmother        dx. 80s; not a smoker; "metastasis to colon"  . Brain cancer Other        dx. >50s  . Cancer Other        dx. <50s; unknown cancer/dx. 62s; unknown type    Social History   Socioeconomic History  . Marital status: Married    Spouse name: Not on  file  . Number of children: Not on file  . Years of education: Not on file  . Highest education level: Not on file  Occupational History  . Not on file  Social Needs  . Financial resource strain: Not on file  . Food insecurity:    Worry: Not on file    Inability: Not on file  . Transportation needs:    Medical: Not on file    Non-medical: Not on file  Tobacco Use  . Smoking status: Never Smoker  . Smokeless tobacco: Never Used  Substance and Sexual Activity  . Alcohol use: Yes    Alcohol/week: 1.2 oz    Types: 2 Glasses of wine per week    Comment: once every two weeks   . Drug use: No  . Sexual activity: Yes    Partners: Male    Birth control/protection: Surgical  Lifestyle  . Physical activity:    Days per week: Not on file    Minutes per session: Not on file  . Stress: Not on file  Relationships  . Social connections:    Talks on phone: Not on file    Gets together: Not on file    Attends religious service: Not on file    Active member of club or organization: Not on file    Attends meetings of clubs or organizations: Not on file    Relationship status: Not on file  . Intimate partner violence:    Fear of current or ex partner: Not on file    Emotionally abused: Not on file    Physically abused: Not on file    Forced sexual activity: Not on file  Other Topics Concern  . 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   HENT: Negative.   Eyes: Negative.   Respiratory: Negative.   Cardiovascular: Negative.   Gastrointestinal: Negative.   Genitourinary: Positive for dysuria.  Musculoskeletal: Negative.   Skin: Negative.   Neurological:  Negative.   Endo/Heme/Allergies: Negative.   Psychiatric/Behavioral: Negative.      PHYSICAL EXAMINATION:    BP 128/80 (BP Location: Right Arm, Patient Position: Sitting, Cuff Size: Normal)   Pulse 80   Resp 16   Wt 151 lb (68.5 kg)   BMI 28.07 kg/m     General appearance: alert, cooperative and appears stated age CVA: not tender Abdomen: soft, tender in the SP region, no rebound, no guarding; non distended, no masses,  no organomegaly    ASSESSMENT Osteopenia with an elevated risk of fracture H/O gastric bypass H/O reflux, controlled on medication Was previously on fosamax and had myalgias.  C/O mild dysuria, sp pain, some back pain. Urine with 2+ WBC.     PLAN Will get copy of her labs (needs CBC, CMP, Phosphorus and vit D) Get a copy of her gastric bypass surgery, not sure she is a candidate for oral biphosphonates, also had myalgia on fosamax Discussed Evista and prolia,  discussed side effects/risks.  Send urine for ua, c&s Discussed holding on antibiotics until the culture is back, the patient prefers to start antibiotics now Will treat with bactrim and pyridium   An After Visit Summary was printed and given to the patient.  ~25 minutes face to face time of which over 50% was spent in counseling.   Addendum: records reviewed, the patient had a Roux-en-Y gastric bypass, oral bipshosphonates are contraindicated.

## 2017-11-05 LAB — URINALYSIS, MICROSCOPIC ONLY: Casts: NONE SEEN /lpf

## 2017-11-07 ENCOUNTER — Other Ambulatory Visit: Payer: Self-pay | Admitting: Obstetrics and Gynecology

## 2017-11-07 LAB — URINE CULTURE

## 2017-11-07 MED ORDER — NITROFURANTOIN MONOHYD MACRO 100 MG PO CAPS: 100.0000 mg | ORAL_CAPSULE | Freq: Two times a day (BID) | ORAL | 0 refills | Status: DC

## 2017-11-10 ENCOUNTER — Telehealth: Payer: Self-pay | Admitting: Obstetrics and Gynecology

## 2017-11-10 NOTE — Telephone Encounter (Signed)
I'm happy to send in Evista, but I'm still waiting labs from her primary. We have called her primary and are waiting on the results.  Please make sure she got my message from the weekend and started the other antibiotic that I called in.   CC: Starlyn Skeans, LPN

## 2017-11-10 NOTE — Telephone Encounter (Signed)
Patient was told to call back, when she decided which medication she would like to take, after talking with Margaretha Sheffield last week.

## 2017-11-10 NOTE — Telephone Encounter (Signed)
Reviewed with Jeb Levering, PCP labs recived and placed on Dr. Talbert Nan desk for review.    Spoke with patient. She did start Macrobid Rx, symptoms have improved. Advised labs have been recived from PCP, will return call to advise on Evista Rx once reviewed by Dr. Talbert Nan. Patient verbalizes understanding.   Patient requesting Evista Rx be printed.    Routing to Dr. Talbert Nan.

## 2017-11-10 NOTE — Telephone Encounter (Signed)
Patient requesting to proceed with Evista Rx, discussed at [place] on 11/04/17.   Patient requesting 90 day supply and printed RX to be picked up from office.   Advised will review with Dr. Talbert Nan and return call once completed. Patient agreeable.    Dr. Talbert Nan -please advise on Evista RX?

## 2017-11-11 MED ORDER — RALOXIFENE HCL 60 MG PO TABS: 60.0000 mg | ORAL_TABLET | Freq: Every day | ORAL | 3 refills | Status: DC

## 2017-11-11 NOTE — Telephone Encounter (Signed)
New order placed for Evista, printed and to Dr. Talbert Nan for signature.   Call placed to [facility] to cancel Evista RX. Spoke with Tanner, Rx cancelled.

## 2017-11-11 NOTE — Telephone Encounter (Signed)
Spoke with patient. Advised as seen below per Dr. Talbert Nan. Rx placed at front office for pick up by patient on 11/12/17.  Patient states UTI symptoms of dysuria and lower abdominal discomfort have improved with abx. Reports lower back pain has been intermittent since 06/2017, asking who she should f/u with. Patient placed on brief hold, reviewed with Dr. Talbert Nan.   Spoke with patient, advised to f/u with PCP for further evaluation of lower back pain. Patient verbalizes understanding and is agreeable. Encounter closed.

## 2017-11-11 NOTE — Telephone Encounter (Signed)
Labs reviewed Will start Evista, remind the patient the biggest risk of Evista is blood clots and vasomotor symptoms. Script printed.

## 2017-12-27 ENCOUNTER — Ambulatory Visit: Payer: BC Managed Care – PPO | Admitting: Neurology

## 2018-02-11 NOTE — Progress Notes (Signed)
NEUROLOGY FOLLOW UP OFFICE NOTE  Anna Navarro [id number]  HISTORY OF PRESENT ILLNESS: Anna Navarro is a 62 year old right-handed woman with depression and history of breast cancer status post lumpectomy and radiation in 2009 who follows up for vestibular migraine.  UPDATE: Intensity:  severe Duration:  2 days Frequency: 2 since last visit (November 2018) Rescue protocol:  Diazepam 2.63m (less if taken at work due to concerns of drowsiness) Current NSAIDS:  Naproxen 5086mCurrent analgesics:  no Current triptans:  no Current ergotamine:  no Current anti-emetic:  no Current muscle relaxants:  no Current anti-anxiolytic:  Diazepam 2.70m57mfor acute attacks of vertigo) Current sleep aide:  no Current Antihypertensive medications:  no Current Antidepressant medications:  topiramate 200m70m bedtime Current Anticonvulsant medications:  no Current anti-CGRP:  no Current Vitamins/Herbal/Supplements:  no Current Antihistamines/Decongestants:  no Other therapy:  no Other medication:  no  Caffeine:  Very little.  No coffee Alcohol:  2 glasses of wine per week Smoker:  no Diet:  A few glasses of water Exercise:  yes Depression:  no; Anxiety:  no Other pain:  no Sleep hygiene:  Good  11/01/17 BMP:  Na 141, K 4, Cl 106, CO2 28, glucose 98, BUN 16, Cr 0.82  HISTORY: She began having recurrent episodes of dizziness in March 2015. She describes it as a sensation of movement, like during a drop on a roller coaster, as well as spinning. It occurs spontaneously and at rest, but can be aggravated with movement. Sometimes it is so severe, she was unable to drive and needed her husband to come and pick her up. She may feel a little queasy, but she denies any significant nausea and no vomiting. She endorses photophobia, phonophobia and osmophobia. She denies lightheadedness, vision loss, focal numbness or weakness, or slurred speech. It is associated with a dull, 6/10 non-throbbing  holocephalic ache. There are no known triggers. Only rest and sleep help. Initially, at time of her first visit, t typically lasts 2-3 days and occurred once a week to once every other week. There are no specific relieving factors.  She has taken meclizine, which is not too effective. Even though the spells are episodic, she didn't quite feel right in between spells.  She was evaluated by Anna Navarro [facility] [place] Navarro showed central findings. She had an MRI of the brain with and without contrast, which was reportedly normal.  She denies preceding viral illness or head trauma. She denies prior history of migraine. She denies family history of migraine.  PAST MEDICAL HISTORY: Past Medical History:  Diagnosis Date  . Allergic rhinitis 02/03/2007  . Breast cancer ([facility])[id number]   IDC of UOQ of right breast; ER/PR+, Her2-, Ki67 10%; s/p lumpectomy and radiation 2009  . Chronic eustachian tube dysfunction 05/23/2010  . DDD (degenerative disc disease), lumbar    seeing Dr. ElsnEllene RouteDepression 02/03/2007  . GERD 11/12/2006  . H/O iron deficiency anemia 05/09/2007   per pt from grastic bypass  . Hyperlipemia 11/12/2006  . Low back pain 02/03/2007  . Neck mass 01/03/2007  . Osteoporosis   . Personal history of radiation therapy 2009  . UTI (lower urinary tract infection) 11/12/2006  . Vestibular neuritis   . Warts 01/07/2010   hands    MEDICATIONS: Current Outpatient Medications on File Prior to Visit  Medication Sig Dispense Refill  . Cholecalciferol (VITAMIN D3) 5000 UNITS TABS Take 1 capsule by mouth daily. 30 tablet   .  desvenlafaxine (PRISTIQ) 50 MG 24 hr tablet Take 1 tablet (50 mg total) by mouth daily. 90 tablet 3  . Estradiol 10 MCG TABS vaginal tablet Place 1 tablet (10 mcg total) 2 (two) times a week vaginally. 24 tablet 3  . gabapentin (NEURONTIN) 100 MG capsule take 1 capsule at night 90 capsule 3  . nitrofurantoin, macrocrystal-monohydrate, (MACROBID) 100 MG  capsule Take 1 capsule (100 mg total) by mouth 2 (two) times daily. 10 capsule 0  . omeprazole (PRILOSEC) 20 MG capsule Take 20 mg by mouth daily.    . phenazopyridine (PYRIDIUM) 200 MG tablet Take 1 tablet (200 mg total) by mouth 3 (three) times daily as needed. 6 tablet 0  . raloxifene (EVISTA) 60 MG tablet Take 1 tablet (60 mg total) by mouth daily. One orally daily 90 tablet 3  . topiramate (TOPAMAX) 100 MG tablet Take 2 tablets (200 mg total) at bedtime by mouth. 180 tablet 3  . vitamin B-12 (CYANOCOBALAMIN) 250 MCG tablet Take 250 mcg by mouth daily.     No current facility-administered medications on file prior to visit.     ALLERGIES: No Known Allergies  FAMILY HISTORY: Family History  Problem Relation Age of Onset  . Heart attack Father   . Other Mother        bilateral mastectomies at 45y due to precancerous tumors  . Breast cancer Mother   . Breast cancer Maternal Aunt        dx. early 66s  . Heart Problems Maternal Aunt   . Lung cancer Maternal Uncle        dx. 62-62; former smoker  . Lung cancer Maternal Grandmother        dx. 72s; not a smoker; "metastasis to colon"  . Brain cancer Other        dx. >50s  . Cancer Other        dx. <50s; unknown cancer/dx. 41s; unknown type   SOCIAL HISTORY: Social History   Socioeconomic History  . Marital status: Married    Spouse name: Not on file  . Number of children: Not on file  . Years of education: Not on file  . Highest education level: Not on file  Occupational History  . Not on file  Social Needs  . Financial resource strain: Not on file  . Food insecurity:    Worry: Not on file    Inability: Not on file  . Transportation needs:    Medical: Not on file    Non-medical: Not on file  Tobacco Use  . Smoking status: Never Smoker  . Smokeless tobacco: Never Used  Substance and Sexual Activity  . Alcohol use: Yes    Alcohol/week: 2.0 standard drinks    Types: 2 Glasses of wine per week    Comment: once every  two weeks   . Drug use: No  . Sexual activity: Yes    Partners: Male    Birth control/protection: Surgical  Lifestyle  . Physical activity:    Days per week: Not on file    Minutes per session: Not on file  . Stress: Not on file  Relationships  . Social connections:    Talks on phone: Not on file    Gets together: Not on file    Attends religious service: Not on file    Active member of club or organization: Not on file    Attends meetings of clubs or organizations: Not on file    Relationship status: Not on  file  . Intimate partner violence:    Fear of current or ex partner: Not on file    Emotionally abused: Not on file    Physically abused: Not on file    Forced sexual activity: Not on file  Other Topics Concern  . 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      Home Situation: lives with husband      Spiritual Beliefs: Jewish      Lifestyle: regular exercise 2-4 days per week; trying to eat healthy       REVIEW OF SYSTEMS: Constitutional: No fevers, chills, or sweats, no generalized fatigue, change in appetite Eyes: No visual changes, double vision, eye pain Ear, nose and throat: No hearing loss, ear pain, nasal congestion, sore throat Cardiovascular: No chest pain, palpitations Respiratory:  No shortness of breath at rest or with exertion, wheezes GastrointestinaI: No nausea, vomiting, diarrhea, abdominal pain, fecal incontinence Genitourinary:  No dysuria, urinary retention or frequency Musculoskeletal:  No neck pain, back pain Integumentary: No rash, pruritus, skin lesions Neurological: as above Psychiatric: No depression, insomnia, anxiety Endocrine: No palpitations, fatigue, diaphoresis, mood swings, change in appetite, change in weight, increased thirst Hematologic/Lymphatic:  No purpura, petechiae. Allergic/Immunologic: no itchy/runny eyes, nasal congestion, recent allergic reactions, rashes  PHYSICAL  EXAM: Blood pressure 118/70, pulse 87, height 5' 1.5" (1.562 m), weight 151 lb (68.5 kg), SpO2 97 %. General: No acute distress.  Patient appears well-groomed.   Head:  Normocephalic/atraumatic Eyes:  Fundi examined but not visualized Neck: supple, no paraspinal tenderness, full range of motion Heart:  Regular rate and rhythm Lungs:  Clear to auscultation bilaterally Back: No paraspinal tenderness Neurological Exam: alert and oriented to person, place, and time. Attention span and concentration intact, recent and remote memory intact, fund of knowledge intact.  Speech fluent and not dysarthric, language intact.  CN II-XII intact. Bulk and tone normal, muscle strength 5/5 throughout.  Sensation to light touch  intact.  Deep tendon reflexes 2+ throughout, toes downgoing.  Finger to nose testing intact.  Gait normal, Romberg negative.  IMPRESSION: Vestibular migraine  PLAN: 1.  Continue topiramate 242m at bedtime 2.  Diazepam for abortive therapy.  She will also try meclizine to see if effective and causes less drowsiness at work. 3.  Naproxen for headache 4.  Limit use of pain relievers to no more than 2 days out of week to prevent risk of rebound or medication-overuse headache. 5.  Follow up in one year.  AMetta Clines DO  CC: RMaury Dus MD

## 2018-02-14 ENCOUNTER — Ambulatory Visit: Payer: BC Managed Care – PPO | Admitting: Neurology

## 2018-02-14 ENCOUNTER — Encounter: Payer: Self-pay | Admitting: Neurology

## 2018-02-14 VITALS — BP 118/70 | HR 87 | Ht 61.5 in | Wt 151.0 lb

## 2018-02-14 DIAGNOSIS — G43109 Migraine with aura, not intractable, without status migrainosus: Secondary | ICD-10-CM

## 2018-02-14 DIAGNOSIS — G43809 Other migraine, not intractable, without status migrainosus: Secondary | ICD-10-CM

## 2018-02-14 MED ORDER — MECLIZINE HCL 25 MG PO TABS: 25.0000 mg | ORAL_TABLET | Freq: Three times a day (TID) | ORAL | 0 refills | Status: DC | PRN

## 2018-02-14 NOTE — Patient Instructions (Addendum)
1.  Continue topiramate 200mg  at bedtime. 2.  When you get a vertigo attack, take either diazepam or meclizine 25mg .  Maybe meclizine could be an option to take at work if it causes less drowsiness. 3.  Follow up in one year or as needed

## 2018-03-11 ENCOUNTER — Other Ambulatory Visit: Payer: Self-pay | Admitting: Neurology

## 2018-03-14 ENCOUNTER — Other Ambulatory Visit: Payer: Self-pay

## 2018-03-14 ENCOUNTER — Encounter: Payer: Self-pay | Admitting: Obstetrics and Gynecology

## 2018-03-14 ENCOUNTER — Ambulatory Visit: Payer: BC Managed Care – PPO | Admitting: Obstetrics and Gynecology

## 2018-03-14 VITALS — BP 124/80 | HR 72 | Ht 61.0 in | Wt 154.2 lb

## 2018-03-14 DIAGNOSIS — N952 Postmenopausal atrophic vaginitis: Secondary | ICD-10-CM

## 2018-03-14 DIAGNOSIS — Z853 Personal history of malignant neoplasm of breast: Secondary | ICD-10-CM

## 2018-03-14 DIAGNOSIS — M8580 Other specified disorders of bone density and structure, unspecified site: Secondary | ICD-10-CM | POA: Diagnosis not present

## 2018-03-14 DIAGNOSIS — Z01419 Encounter for gynecological examination (general) (routine) without abnormal findings: Secondary | ICD-10-CM

## 2018-03-14 MED ORDER — ESTRADIOL 10 MCG VA TABS: 1.0000 | ORAL_TABLET | VAGINAL | 3 refills | Status: DC

## 2018-03-14 NOTE — Progress Notes (Signed)
62 y.o. G2P1102 Married  female here for annual exam.   H/O right breast cancer (ER+). H/O TAH. She is using vaginal estrogen for dyspareunia. Helping, intercourse is much better. No vaginal bleeding.  No bowel or bladder c/o.     No LMP recorded. Patient has had a hysterectomy.          Sexually active: Yes.    The current method of family planning is status post hysterectomy.    Exercising: Yes.    cardio, weights Smoker:  no  Health Maintenance: Pap:  2006 History of abnormal Pap:  no MMG: 10-01-2017 Birads 1 negative Colonoscopy:  2009 Normal per patient, scheduled in Dec 2019  BMD:   10/01/2017 Osteopenia T score -2.4. FRAX 19.3%/3.6%. Started on Evista in 6/19, tolerating fine.  TDaP:  UTD  Gardasil: N/A   reports that she has never smoked. She has never used smokeless tobacco. She reports that she drinks about 2.0 standard drinks of alcohol per week. She reports that she does not use drugs. 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 Her children are local. No grandchildren  Past Medical History:  Diagnosis Date  . Allergic rhinitis 02/03/2007  . Breast cancer ([facility]CC) 2009   IDC of UOQ of right breast; ER/PR+, Her2-, Ki67 10%; s/p lumpectomy and radiation 2009  . Chronic eustachian tube dysfunction 05/23/2010  . DDD (degenerative disc disease), lumbar    seeing Dr. Elsner  . Depression 02/03/2007  . GERD 11/12/2006  . H/O iron deficiency anemia 05/09/2007   per pt from grastic bypass  . Hyperlipemia 11/12/2006  . Low back pain 02/03/2007  . Neck mass 01/03/2007  . Osteoporosis   . Personal history of radiation therapy 2009  . UTI (lower urinary tract infection) 11/12/2006  . Vestibular neuritis   . Warts 01/07/2010   hands    Past Surgical History:  Procedure Laterality Date  . BREAST BIOPSY Right 2009   High Risk Stereo Biopsy   . BREAST LUMPECTOMY Right 2009  . BREAST LUMPECTOMY WITH AXILLARY LYMPH NODE BIOPSY Right 08/2007   RT, ER/PR+, T1N0  . 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 TOTAL ABDOMINAL HYSTERECTOMY      Current Outpatient Medications  Medication Sig Dispense Refill  . Cholecalciferol (VITAMIN D3) 5000 UNITS TABS Take 1 capsule by mouth daily. 30 tablet   . desvenlafaxine (PRISTIQ) 50 MG 24 hr tablet Take 1 tablet (50 mg total) by mouth daily. 90 tablet 3  . Estradiol 10 MCG TABS vaginal tablet Place 1 tablet (10 mcg total) 2 (two) times a week vaginally. 24 tablet 3  . gabapentin (NEURONTIN) 100 MG capsule take 1 capsule at night 90 capsule 3  . meclizine (ANTIVERT) 25 MG tablet Take 1 tablet (25 mg total) by mouth 3 (three) times daily as needed for dizziness. 30 tablet 0  . omeprazole (PRILOSEC) 20 MG capsule Take 20 mg by mouth daily.    . raloxifene (EVISTA) 60 MG tablet Take 1 tablet (60 mg total) by mouth daily. One orally daily 90 tablet 3  . topiramate (TOPAMAX) 100 MG tablet TAKE 2 TABLETS(200 MG) BY MOUTH AT BEDTIME 180 tablet 1  . vitamin B-12 (CYANOCOBALAMIN) 250 MCG tablet Take 250 mcg by mouth daily.     No current facility-administered medications for this visit.     Family History  Problem Relation Age of Onset  . Heart attack Father   . Other Mother        bilateral   mastectomies at 45y due to precancerous tumors  . Breast cancer Mother   . Breast cancer Maternal Aunt        dx. early 80s  . Heart Problems Maternal Aunt   . Lung cancer Maternal Uncle        dx. 62-62; former smoker  . Lung cancer Maternal Grandmother        dx. 50s; not a smoker; "metastasis to colon"  . Brain cancer Other        dx. >50s  . Cancer Other        dx. <50s; unknown cancer/dx. 30s; unknown type    Review of Systems  Constitutional: Negative.   HENT: Negative.   Eyes: Negative.   Respiratory: Negative.   Cardiovascular: Negative.   Gastrointestinal: Negative.   Endocrine: Negative.   Genitourinary: Negative.   Musculoskeletal: Negative.   Skin: Negative.   Allergic/Immunologic: Negative.    Neurological: Negative.   Hematological: Negative.   Psychiatric/Behavioral: Negative.     Exam:   BP 124/80 (BP Location: Left Arm, Patient Position: Sitting, Cuff Size: Normal)   Pulse 72   Ht 5' 1" (1.549 m)   Wt 154 lb 3.2 oz (69.9 kg)   BMI 29.14 kg/m   Weight change: @WEIGHTCHANGE@ Height:   Height: 5' 1" (154.9 cm)  Ht Readings from Last 3 Encounters:  03/14/18 5' 1" (1.549 m)  02/14/18 5' 1.5" (1.562 m)  03/29/17 5' 1.5" (1.562 m)    General appearance: alert, cooperative and appears stated age Head: Normocephalic, without obvious abnormality, atraumatic Neck: no adenopathy, supple, symmetrical, trachea midline and thyroid normal to inspection and palpation Lungs: clear to auscultation bilaterally Cardiovascular: regular rate and rhythm Breasts: normal appearance, no masses or tenderness, evidence of left lumpectomy Abdomen: soft, non-tender; non distended,  no masses,  no organomegaly Extremities: extremities normal, atraumatic, no cyanosis or edema Skin: Skin color, texture, turgor normal. No rashes or lesions Lymph nodes: Cervical, supraclavicular, and axillary nodes normal. No abnormal inguinal nodes palpated Neurologic: Grossly normal   Pelvic: External genitalia:  no lesions              Urethra:  normal appearing urethra with no masses, tenderness or lesions              Bartholins and Skenes: normal                 Vagina: normal appearing vagina with normal color and discharge, no lesions              Cervix: absent               Bimanual Exam:  Uterus:  uterus absent              Adnexa: no mass, fullness, tenderness               Rectovaginal: Confirms               Anus:  normal sphincter tone, no lesions  Chaperone was present for exam.  A:  Well Woman with normal exam  H/o breast cancer  Vaginal atrophy  H/O osteopenia with elevated risk of fracture  P:   Mammogram UTD  No pap needed  Labs with primary  Discussed breast self  exam  Discussed calcium and vit D intake.   Continue low dose vaginal estrogen (Oncologist is aware)  Continue Evista (she will call when she needs a refill)    

## 2018-03-14 NOTE — Patient Instructions (Signed)

## 2018-04-20 ENCOUNTER — Encounter: Payer: Self-pay | Admitting: Obstetrics and Gynecology

## 2018-04-21 ENCOUNTER — Telehealth: Payer: Self-pay | Admitting: Obstetrics and Gynecology

## 2018-04-21 NOTE — Telephone Encounter (Signed)
Message left to return call to Emily at [phone].    

## 2018-04-21 NOTE — Telephone Encounter (Signed)
Patient returned call. Patient states that she has recently restarted taking the Evista, but that she thinks the medication is causing her to have leg cramps. States her legs don't cramp during the day, mostly at night when she sits still and is trying to relax. Patient states she did not take the medication last night. RN advised OV needed to discuss potential SE and other options for treatment. 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 OV scheduled for 05-09-18 at 1115. Patient agreeable to date and time of appointment. Patient asking if Dr. Talbert Nan wants her to continue taking Evista or stop prior to her appointment? RN advised would review with Dr. Talbert Nan and return call. Patient agreeable and states to leave detailed message if she does not answer.   Routing to provider for review.

## 2018-04-21 NOTE — Telephone Encounter (Signed)
Patient sent the following correspondence through [facility]. Routing to triage to assist patient with request.  Is it possible that the raloxifene is causing leg pain and joint pain? I forgot to ask you about it at my last visit. I started having it a few months after taking it and stopped taking it and the cramps stopped. Then I started it again and the cramping started again. It's very uncomfortable. I was just wondering and thought this would be the first place to start. [place] YOU  Anna Navarro

## 2018-04-22 NOTE — Telephone Encounter (Signed)
Call to patient. Per ROI, can leave message on voice mail. ( confirmed phone number on phone monitor)  Left message with response from DR Talbert Nan to stop Evista and discuss options at next appointment. Call back if questions or needs clarification.

## 2018-04-22 NOTE — Telephone Encounter (Signed)
Up to 12% of patient's can get leg cramps with the Evista. I would have her stop the Evista, we can discuss options at her visit on 05/09/18

## 2018-05-04 NOTE — Telephone Encounter (Signed)
Routing to provider for review. Okay to close encounter or is further follow up needed?

## 2018-05-05 NOTE — Progress Notes (Signed)
GYNECOLOGY  VISIT   HPI: 62 y.o.   Married Caucasian female   [phone] with No LMP recorded. Patient has had a hysterectomy.   here for possible side effects on Evista. Reports she has having muscle tightness and aching that began in October. She started Evista in 6/19. She has been having bilateral leg pain at least since October. Worse if she sits for a long time or stands. Okay when she wakes up. Worse at night. Muscles feel tight.  In June she had a normal Calcium and vit d level with her primary. She is getting 1,500 mg of calcium a day. She is on high dose vit d. She is exercising. She stopped the Evista on 04/22/18 at our direction and her pain is already getting better. She had a colonoscopy on Friday, had 2 polyps removed, awaiting the pathology.   GYNECOLOGIC HISTORY: No LMP recorded. Patient has had a hysterectomy. Contraception: Hysterectomy Menopausal hormone therapy: Estradiol vaginal tablets        OB History    Gravida  2   Para  2   Term  1   Preterm  1   AB      Living  2     SAB      TAB      Ectopic      Multiple      Live Births                 Patient Active Problem List   Diagnosis Date Noted  . Genetic testing 01/28/2015  . Rhinitis, allergic 11/02/2014  . Hyperlipemia 11/02/2014  . 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 History of breast cancer 08/18/2012  . GERD 11/12/2006    Past Medical History:  Diagnosis Date  . Allergic rhinitis 02/03/2007  . Breast cancer ([place]) 2009   IDC of UOQ of right breast; ER/PR+, Her2-, Ki67 10%; s/p lumpectomy and radiation 2009  . Chronic eustachian tube dysfunction 05/23/2010  . DDD (degenerative disc disease), lumbar    seeing Dr. Ellene Route  . Depression 02/03/2007  . GERD 11/12/2006  . H/O iron deficiency anemia 05/09/2007   per pt from grastic bypass  . Hyperlipemia 11/12/2006  . Low back pain 02/03/2007  . Neck mass 01/03/2007  . Osteoporosis   . Personal history of radiation therapy 2009  . UTI  (lower urinary tract infection) 11/12/2006  . Vestibular neuritis   . Warts 01/07/2010   hands    Past Surgical History:  Procedure Laterality Date  . BREAST BIOPSY Right 2009   High Risk Stereo Biopsy   . BREAST LUMPECTOMY Right 2009  . BREAST LUMPECTOMY WITH AXILLARY LYMPH NODE BIOPSY Right 08/2007   RT, ER/PR+, T1N0  . 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 TOTAL ABDOMINAL HYSTERECTOMY      Current Outpatient Medications  Medication Sig Dispense Refill  . Cholecalciferol (VITAMIN D3) 5000 UNITS TABS Take 1 capsule by mouth daily. 30 tablet   . desvenlafaxine (PRISTIQ) 50 MG 24 hr tablet Take 1 tablet (50 mg total) by mouth daily. 90 tablet 3  . Estradiol 10 MCG TABS vaginal tablet Place 1 tablet (10 mcg total) vaginally 2 (two) times a week. 24 tablet 3  . gabapentin (NEURONTIN) 100 MG capsule take 1 capsule at night 90 capsule 3  . meclizine (ANTIVERT) 25 MG tablet Take 1 tablet (25 mg total) by mouth 3 (three) times daily as needed for dizziness. 30 tablet 0  . omeprazole (PRILOSEC) 20  MG capsule Take 20 mg by mouth daily.    Marland Kitchen topiramate (TOPAMAX) 100 MG tablet TAKE 2 TABLETS(200 MG) BY MOUTH AT BEDTIME 180 tablet 1  . vitamin B-12 (CYANOCOBALAMIN) 250 MCG tablet Take 250 mcg by mouth daily.    . raloxifene (EVISTA) 60 MG tablet Take 1 tablet (60 mg total) by mouth daily. One orally daily (Patient not taking: Reported on 05/09/2018) 90 tablet 3   No current facility-administered medications for this visit.      ALLERGIES: Patient has no known allergies.  Family History  Problem Relation Age of Onset  . Heart attack Father   . Other Mother        bilateral mastectomies at 45y due to precancerous tumors  . Breast cancer Mother   . Breast cancer Maternal Aunt        dx. early 54s  . Heart Problems Maternal Aunt   . Lung cancer Maternal Uncle        dx. 62-62; former smoker  . Lung cancer Maternal Grandmother        dx. 11s; not a smoker; "metastasis to  colon"  . Brain cancer Other        dx. >50s  . Cancer Other        dx. <50s; unknown cancer/dx. 45s; unknown type    Social History   Socioeconomic History  . Marital status: Married    Spouse name: Not on file  . Number of children: Not on file  . Years of education: Not on file  . Highest education level: Not on file  Occupational History  . Not on file  Social Needs  . Financial resource strain: Not on file  . Food insecurity:    Worry: Not on file    Inability: Not on file  . Transportation needs:    Medical: Not on file    Non-medical: Not on file  Tobacco Use  . Smoking status: Never Smoker  . Smokeless tobacco: Never Used  Substance and Sexual Activity  . Alcohol use: Yes    Alcohol/week: 2.0 standard drinks    Types: 2 Glasses of wine per week    Comment: once every two weeks   . Drug use: No  . Sexual activity: Yes    Partners: Male    Birth control/protection: Surgical  Lifestyle  . Physical activity:    Days per week: Not on file    Minutes per session: Not on file  . Stress: Not on file  Relationships  . Social connections:    Talks on phone: Not on file    Gets together: Not on file    Attends religious service: Not on file    Active member of club or organization: Not on file    Attends meetings of clubs or organizations: Not on file    Relationship status: Not on file  . Intimate partner violence:    Fear of current or ex partner: Not on file    Emotionally abused: Not on file    Physically abused: Not on file    Forced sexual activity: Not on file  Other Topics Concern  . 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   HENT: Negative.   Eyes: Negative.   Respiratory: Negative.    Cardiovascular: Negative.   Gastrointestinal: Negative.   Genitourinary: Negative.   Musculoskeletal:       Muscle aches and pain  Skin: Negative.   Neurological: Negative.   Endo/Heme/Allergies: Negative.   Psychiatric/Behavioral: Negative.     PHYSICAL EXAMINATION:    BP 124/88 (BP Location: Right Arm, Patient Position: Sitting, Cuff Size: Normal)   Pulse 72   Wt 153 lb 9.6 oz (69.7 kg)   BMI 29.02 kg/m     General appearance: alert, cooperative and appears stated age  ASSESSMENT Osteopenia with elevated risk of fracture Side effect to Evista, muscle aches.  Also had muscle aches with Fosamax in the past Normal Calcium and vit D levels    PLAN She has stopped the Evista Continue Calcium, vit d and exercise Will send for a consultation with Rheumatolgoy   An After Visit Summary was printed and given to the patient.  ~15 minutes face to face time of which over 50% was spent in counseling.

## 2018-05-09 ENCOUNTER — Ambulatory Visit (INDEPENDENT_AMBULATORY_CARE_PROVIDER_SITE_OTHER): Payer: BC Managed Care – PPO | Admitting: Obstetrics and Gynecology

## 2018-05-09 ENCOUNTER — Other Ambulatory Visit: Payer: Self-pay

## 2018-05-09 ENCOUNTER — Encounter: Payer: Self-pay | Admitting: Obstetrics and Gynecology

## 2018-05-09 VITALS — BP 124/88 | HR 72 | Wt 153.6 lb

## 2018-05-09 DIAGNOSIS — M791 Myalgia, unspecified site: Secondary | ICD-10-CM

## 2018-05-09 DIAGNOSIS — T887XXA Unspecified adverse effect of drug or medicament, initial encounter: Secondary | ICD-10-CM

## 2018-05-09 DIAGNOSIS — M8580 Other specified disorders of bone density and structure, unspecified site: Secondary | ICD-10-CM | POA: Diagnosis not present

## 2018-05-09 NOTE — Addendum Note (Signed)
Addended by: Reesa Chew E on: 05/09/2018 11:45 AM   Modules accepted: Orders

## 2018-06-02 NOTE — Progress Notes (Addendum)
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   History of Present Illness: Anna Navarro is a 63 y.o. female with history of osteoporosis seen for management.  According to patient she was diagnosed with osteoporosis in 2010 and was started on Fosamax.  She states she took the Fosamax for about 1 year and realized that she was having a lot of discomfort from the Fosamax.  She discontinued the medication after 1 year.  Her bone density improved and was in the osteopenia range.  She states she started exercising and take calcium and vitamin D.  In May 2019 her bone density showed a T score of -2.4 and a drop in BMD greater than 5%.  At the time she was a started on Evista.  She states that this also caused increased joint pain and she discontinued the medication.  She denies any history of pathological fracture.  She states about 10 years ago she fell and fractured her right ankle joint.  She has been on gabapentin and Topamax for several years and also takes Prilosec for reflux.  She has had gastric bypass in 2007.  She does go to the gym and works out on regular basis.  Patient states that she has lower back pain for many years due to disc disease.  She has had cortisone injections in the past which were not effective.  She also has discomfort in her hands and she has noticed some changes in her hands.  She has seen Dr. Noemi Chapel for osteoarthritis in her knee joints.  Activities of Daily Living:  Patient reports morning stiffness for 0 minute.   Patient Denies nocturnal pain.  Difficulty dressing/grooming: Denies Difficulty climbing stairs: Denies Difficulty getting out of chair: Reports back pain Difficulty using hands for taps, buttons, cutlery, and/or writing:  Reports  Review of Systems  Constitutional: Positive for fatigue. Negative for night sweats, weight gain and weight loss.  HENT: Negative for mouth sores, trouble swallowing, trouble swallowing, mouth dryness and nose dryness.   Eyes: Negative for pain, redness, visual disturbance and dryness.  Respiratory: Negative for cough, shortness of breath and difficulty breathing.   Cardiovascular: Negative for chest pain, palpitations, hypertension, irregular heartbeat and swelling in legs/feet.  Gastrointestinal: Positive for constipation. Negative for blood in stool and diarrhea.  Endocrine: Negative for increased urination.  Genitourinary: Negative for vaginal dryness.  Musculoskeletal: Positive for arthralgias and joint pain. Negative for joint swelling, myalgias, muscle weakness, morning stiffness, muscle tenderness and myalgias.  Skin: Positive for color change. Negative for rash, hair loss, skin tightness, ulcers and sensitivity to sunlight.  Allergic/Immunologic: Negative for susceptible to infections.  Neurological: Negative for dizziness, memory loss, night sweats and weakness.  Hematological: Negative for swollen glands.  Psychiatric/Behavioral: Negative for depressed mood and sleep disturbance. The patient is not nervous/anxious.     PMFS History:  Patient Active Problem List   Diagnosis Date Noted  . Genetic testing 01/28/2015  . Rhinitis, allergic 11/02/2014  . Hyperlipemia 11/02/2014  . 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 History of breast cancer 08/18/2012  . GERD 11/12/2006    Past Medical History:  Diagnosis Date  .  Allergic rhinitis 02/03/2007  . Breast cancer ([place]) 2009   IDC of UOQ of right breast; ER/PR+, Her2-, Ki67 10%; s/p lumpectomy and radiation 2009  . Chronic eustachian tube dysfunction 05/23/2010  . DDD (degenerative disc disease), lumbar    seeing Dr. Ellene Route  . Depression 02/03/2007  . GERD 11/12/2006  . H/O iron deficiency anemia 05/09/2007   per pt from  grastic bypass  . Hyperlipemia 11/12/2006  . Low back pain 02/03/2007  . Neck mass 01/03/2007  . Osteoporosis   . Personal history of radiation therapy 2009  . UTI (lower urinary tract infection) 11/12/2006  . Vestibular neuritis   . Warts 01/07/2010   hands    Family History  Problem Relation Age of Onset  . Heart attack Father   . Other Mother        bilateral mastectomies at 45y due to precancerous tumors  . Breast cancer Mother   . Healthy Mother   . Mental illness Sister   . Healthy Son   . Healthy Son   . Healthy Daughter   . Breast cancer Maternal Aunt        dx. early 38s  . Heart Problems Maternal Aunt   . Lung cancer Maternal Uncle        dx. 62-62; former smoker  . Lung cancer Maternal Grandmother        dx. 30s; not a smoker; "metastasis to colon"  . Brain cancer Other        dx. >50s  . Cancer Other        dx. <50s; unknown cancer/dx. 30s; unknown type   Past Surgical History:  Procedure Laterality Date  . BREAST BIOPSY Right 2009   High Risk Stereo Biopsy   . BREAST LUMPECTOMY Right 2009  . BREAST LUMPECTOMY WITH AXILLARY LYMPH NODE BIOPSY Right 08/2007   RT, ER/PR+, T1N0  . 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 KNEE SURGERY Left   . 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 Td 05/18/1993     Objective: Vital Signs: BP 127/74 (BP Location: Left Arm, Patient Position: Sitting, Cuff Size: Normal)   Pulse 73   Resp 12   Ht 5' 1"  (1.549 m)   Wt 153 lb (69.4 kg)   BMI 28.91 kg/m    Physical Exam Vitals signs and nursing note reviewed.  Constitutional:      Appearance: She is well-developed.  HENT:     Head: Normocephalic and  atraumatic.  Eyes:     Conjunctiva/sclera: Conjunctivae normal.  Neck:     Musculoskeletal: Normal range of motion.  Cardiovascular:     Rate and Rhythm: Normal rate and regular rhythm.     Heart sounds: Normal heart sounds.  Pulmonary:     Effort: Pulmonary effort is normal.     Breath sounds: Normal breath sounds.  Abdominal:     General: Bowel sounds are normal.     Palpations: Abdomen is soft.  Lymphadenopathy:     Cervical: No cervical adenopathy.  Skin:    General: Skin is warm and dry.     Capillary Refill:  Capillary refill takes less than 2 seconds.  Neurological:     Mental Status: She is alert and oriented to person, place, and time.  Psychiatric:        Behavior: Behavior normal.      Musculoskeletal Exam: C-spine thoracic and lumbar spine good range of motion.  Shoulder joints elbow joints wrist joints with good range of motion.  She has DIP and PIP thickening in her bilateral hands consistent with osteoarthritis.  No synovitis was noted.  Hip joints were in good range of motion.  She has some crepitus in her bilateral knee joints with limited extension of left knee joint.  CDAI Exam: CDAI Score: Not documented Patient Global Assessment: Not documented; Provider Global Assessment: Not documented Swollen: Not documented; Tender: Not documented Joint Exam   Not documented   There is currently no information documented on the homunculus. Go to the Rheumatology activity and complete the homunculus joint exam.  Investigation: No additional findings.  Imaging: No results found.  Recent Labs: Lab Results  Component Value Date   WBC 5.6 12/13/2013   HGB 13.2 12/13/2013   PLT 226.0 12/13/2013   NA 140 12/13/2013   K 4.1 12/13/2013   CL 104 12/13/2013   CO2 29 12/13/2013   GLUCOSE 91 12/13/2013   BUN 13 12/13/2013   CREATININE 0.8 12/13/2013   BILITOT 0.5 07/18/2013   ALKPHOS 51 07/18/2013   AST 20 07/18/2013   ALT 24 07/18/2013   PROT 6.5 07/18/2013    ALBUMIN 3.8 07/18/2013   CALCIUM 8.7 12/13/2013   GFRAA  08/16/2007    >60        The eGFR has been calculated using the MDRD equation. This calculation has not been validated in all clinical    Speciality Comments: No specialty comments available.  Procedures:  No procedures performed Allergies: Patient has no known allergies.   Assessment / Plan:     Visit Diagnoses: Osteopenia with high risk of fracture - 10/01/17: BMD measured at Femur Total Left is 0.707 g/cm2 with a T-scoreof -2.4.  -Patient has significant change in her bone density compared to previous DEXA.  Although she is a still in osteopenia range.  She has several factors against her which include use of gabapentin use of Topamax and Prilosec.  Use of calcium, vitamin D was discussed at length.  We also discussed different treatment options.  I believe she may do better on once a month medication like Actonel.  Indications side effects contraindications were discussed.  Method of taking Actonel was discussed.  The other option could be to do IV Reclast which would be once a year.  She will experience pain with both medications but for shorter duration compared to weekly medication.  Plan: PTH, intact and calcium, VITAMIN D 25 Hydroxy (Vit-D Deficiency, Fractures), Serum protein electrophoresis with reflex, COMPLETE METABOLIC PANEL WITH GFR WE will call in prescription for Actonel 150 mg monthly after labs are available.  Primary osteoarthritis of both hands-she has some discomfort in her hands from underlying osteoarthritis.  Muscle strengthening and joint protection was discussed.  Primary osteoarthritis of both knees-she is followed by Dr. Noemi Chapel.  She has limited extension in her left knee.  She is not much difficulty with mobility.  DDD (degenerative disc disease), lumbar-she has chronic lower back pain.  History of breast cancer - 2009 lumpectomy and RTX  History of gastroesophageal reflux (GERD)  Vestibular  migraine-she is on gabapentin and Topamax which can decrease bone density as well.  Have advised her to discuss other alternative therapies with her neurologist.  Status post gastric bypass for obesity - 2007   Orders: Orders Placed This Encounter  Procedures  . PTH, intact and calcium  . VITAMIN D 25 Hydroxy (Vit-D Deficiency, Fractures)  . Serum protein electrophoresis with reflex  . COMPLETE METABOLIC PANEL WITH GFR   No orders of the defined types were placed in this encounter.   Face-to-face time spent with patient was 50 minutes. Greater than 50% of time was spent in counseling and coordination of care.  Follow-Up Instructions: Return for Osteoporosis.   Bo Merino, MD  Note - This record has been created using Editor, commissioning.  Chart creation errors have been sought, but may not always  have been located. Such creation errors do not reflect on  the standard of medical care.

## 2018-06-15 ENCOUNTER — Ambulatory Visit: Payer: BC Managed Care – PPO | Admitting: Rheumatology

## 2018-06-15 ENCOUNTER — Encounter: Payer: Self-pay | Admitting: Rheumatology

## 2018-06-15 ENCOUNTER — Telehealth: Payer: Self-pay | Admitting: Pharmacist

## 2018-06-15 VITALS — BP 127/74 | HR 73 | Resp 12 | Ht 61.0 in | Wt 153.0 lb

## 2018-06-15 DIAGNOSIS — Z9884 Bariatric surgery status: Secondary | ICD-10-CM

## 2018-06-15 DIAGNOSIS — M170 Bilateral primary osteoarthritis of knee: Secondary | ICD-10-CM | POA: Diagnosis not present

## 2018-06-15 DIAGNOSIS — M5136 Other intervertebral disc degeneration, lumbar region: Secondary | ICD-10-CM

## 2018-06-15 DIAGNOSIS — M8580 Other specified disorders of bone density and structure, unspecified site: Secondary | ICD-10-CM | POA: Diagnosis not present

## 2018-06-15 DIAGNOSIS — Z8719 Personal history of other diseases of the digestive system: Secondary | ICD-10-CM

## 2018-06-15 DIAGNOSIS — G43109 Migraine with aura, not intractable, without status migrainosus: Secondary | ICD-10-CM

## 2018-06-15 DIAGNOSIS — M19041 Primary osteoarthritis, right hand: Secondary | ICD-10-CM

## 2018-06-15 DIAGNOSIS — Z853 Personal history of malignant neoplasm of breast: Secondary | ICD-10-CM

## 2018-06-15 DIAGNOSIS — M19042 Primary osteoarthritis, left hand: Secondary | ICD-10-CM

## 2018-06-15 DIAGNOSIS — G43809 Other migraine, not intractable, without status migrainosus: Secondary | ICD-10-CM

## 2018-06-15 NOTE — Patient Instructions (Addendum)
Please start taking Actonel 150 mg monthly along with calcium citrate. Avoid taking calcium citrate with Prilosec for better absorption.  Risedronate tablets What is this medicine? RISEDRONATE (ris ED roe nate) reduces calcium loss from bones. It helps make healthy bone and to slow bone loss in patients with Paget's disease and osteoporosis. It may be used in others at risk for bone loss. This medicine may be used for other purposes; ask your health care provider or pharmacist if you have questions. COMMON BRAND NAME(S): Actonel What should I tell my health care provider before I take this medicine? They need to know if you have any of these conditions: -dental disease -esophagus, stomach, or intestine problems, like acid reflux or GERD -kidney disease -low blood calcium -problems sitting or standing for 30 minutes -trouble swallowing -an unusual or allergic reaction to risedronate, other medicines, foods, dyes, or preservatives -pregnant or trying to get pregnant -breast-feeding How should I use this medicine? You must take this medication exactly as directed or you will lower the amount of medicine you absorb into your body or you may cause your self harm. Take this medicine by mouth first thing in the morning, after you are up for the day. Do not eat or drink anything before you take this medicine. Swallow the tablets with a full glass (6 to 8 fluid ounces) of plain water. Do not take the tablets with any other drink. Do not chew or crush the tablet. After taking this medicine, do not eat breakfast, drink, or take any other medicines or vitamins for at least 30 minutes. Stand or sit up for at least 30 minutes after you take this medicine; do not lie down. Do not take your medicine more often than directed. Talk to your pediatrician regarding the use of this medicine in children. Special care may be needed. Overdosage: If you think you have taken too much of this medicine contact a poison  control center or emergency room at once. NOTE: This medicine is only for you. Do not share this medicine with others. What if I miss a dose? If you miss a dose, do not take it later in the day. Take your normal dose the next morning. Do not take double or extra doses. What may interact with this medicine? -antacids like aluminum hydroxide or magnesium hydroxide -aspirin -calcium supplements -iron supplements -NSAIDs, medicines for pain and inflammation, like ibuprofen or naproxen -thyroid hormones -vitamins with minerals This list may not describe all possible interactions. Give your health care provider a list of all the medicines, herbs, non-prescription drugs, or dietary supplements you use. Also tell them if you smoke, drink alcohol, or use illegal drugs. Some items may interact with your medicine. What should I watch for while using this medicine? Visit your doctor or health care professional for regular check ups. It may be some time before you see the benefit from this medicine. Your doctor or health care professional may order blood tests and other tests to see how you are doing. You should make sure you get enough calcium and vitamin D while you are taking this medicine, unless your doctor tells you not to. Discuss the foods you eat and the vitamins you take with your health care professional. Some people who take this medicine have severe bone, joint, and/or muscle pain. This medicine may also increase your risk for a broken thigh bone. Tell your doctor right away if you have pain in your upper leg or groin. Tell your doctor if  you have any pain that does not go away or that gets worse. What side effects may I notice from receiving this medicine? Side effects that you should report to your doctor or health care professional as soon as possible: -allergic reactions such as skin rash or itching, hives, swelling of the face, lips, throat, or tongue -black or tarry stools -changes in  vision -heartburn or stomach pain -jaw pain, especially after dental work -pain or difficulty when swallowing -redness, blistering, peeling, or loosening of the skin, including inside the mouth Side effects that usually do not require medical attention (report to your doctor or health care professional if they continue or are bothersome): -bone, muscle, or joint pain -changes in taste -diarrhea or constipation -eye pain or itching -headache -nausea or vomiting -stomach gas or fullness This list may not describe all possible side effects. Call your doctor for medical advice about side effects. You may report side effects to FDA at 1-800-FDA-1088. Where should I keep my medicine? Keep out of the reach of children. Store at room temperature between 20 and 25 degrees C (68 and 77 degrees F). Throw away any unused medicine after the expiration date. NOTE: This sheet is a summary. It may not cover all possible information. If you have questions about this medicine, talk to your doctor, pharmacist, or health care provider.  2019 Elsevier/Gold Standard (2015-06-06 09:18:09)

## 2018-06-15 NOTE — Progress Notes (Addendum)
Pharmacy Note  Subjective: Patient presents today to the [facility] to see Dr. Estanislado Pandy.  Patient seen by pharmacist for counseling on bisphosphonate therapy for Osteopenia with risk of fracture.  She had greater than 5% decrease in BMD since 2017. She was previously treated with Fosamax and Evista but could not tolerate due to bone pain.    Objective: T-score: -2.4 at left femur neck on 10/04/17 Calcium: pending 06/15/2018 Vitamin D: pending 06/15/2018 GFR: pending 06/15/2018  Assessment/Plan: Counseled patient that Actonel is an bisphosphonate that reduces bone turnover by inhibiting osteoclasts that chew up bone.  Counseled patient on purpose, proper use, and adverse effects of Actonel.  Reviewed with patient that Actonel should be taken monthly.  It must be taking first thing in the morning, with a full glass of water, and she must wait an hour prior to eating food.  Also advised patient that she should not lie down until after she has eaten.  Reviewed importance of taking calcium and vitamin D with bisphosphonate therapy.  Patient confirms she is already taking calcium/vitamin D.  She is also taking Prilosec which can inhibit calcium absorption.  Recommended calcium citrate and not to take at the same time as the Prilosec to help improve absorption.  Provided patient with medication education material and answered all questions.  Reviewed adverse events of Actonel including risk of nausea & diarrhea, headache, and muscle & bone pain.  Reviewed rare adverse effect of osteonecrosis of the jaw and advised patient to alert her dentist that she is on Actonel prior to any major dental work.  Patient confirms she does not have any major dental work scheduled at this time.  Patient agrees to trial of Actonel at this time.     Patient dose will be Actonel 150 mg monthly.  Prescription pending labs results.  All questions encouraged and answered.  Instructed patient to call with any further  questions or concerns.  Mariella Saa, PharmD, [facility] Rheumatology Clinical Pharmacist  06/15/2018 9:10 AM

## 2018-06-15 NOTE — Telephone Encounter (Signed)
Patient dose will be Actonel 150 mg monthly.  Prescription pending labs results.

## 2018-06-17 LAB — COMPLETE METABOLIC PANEL WITH GFR
AG Ratio: 1.9 (calc) (ref 1.0–2.5)
ALT: 13 U/L (ref 6–29)
AST: 12 U/L (ref 10–35)
Albumin: 4.1 g/dL (ref 3.6–5.1)
Alkaline phosphatase (APISO): 82 U/L (ref 33–130)
BUN: 19 mg/dL (ref 7–25)
CO2: 26 mmol/L (ref 20–32)
Calcium: 9.5 mg/dL (ref 8.6–10.4)
Chloride: 110 mmol/L (ref 98–110)
Creat: 0.82 mg/dL (ref 0.50–0.99)
GFR, Est African American: 88 mL/min/{1.73_m2} (ref >=60)
GFR, Est Non African American: 76 mL/min/{1.73_m2} (ref >=60)
Globulin: 2.2 g/dL (calc) (ref 1.9–3.7)
Glucose, Bld: 96 mg/dL (ref 65–99)
Potassium: 4.7 mmol/L (ref 3.5–5.3)
Sodium: 143 mmol/L (ref 135–146)
Total Bilirubin: 0.3 mg/dL (ref 0.2–1.2)
Total Protein: 6.3 g/dL (ref 6.1–8.1)

## 2018-06-17 LAB — VITAMIN D 25 HYDROXY (VIT D DEFICIENCY, FRACTURES): Vit D, 25-Hydroxy: 53 ng/mL (ref 30–100)

## 2018-06-17 LAB — PROTEIN ELECTROPHORESIS, SERUM, WITH REFLEX
Albumin ELP: 4.1 g/dL (ref 3.8–4.8)
Alpha 1: 0.2 g/dL (ref 0.2–0.3)
Alpha 2: 0.6 g/dL (ref 0.5–0.9)
Beta 2: 0.3 g/dL (ref 0.2–0.5)
Beta Globulin: 0.4 g/dL (ref 0.4–0.6)
Gamma Globulin: 0.8 g/dL (ref 0.8–1.7)
Total Protein: 6.4 g/dL (ref 6.1–8.1)

## 2018-06-17 LAB — PTH, INTACT AND CALCIUM
Calcium: 9.5 mg/dL (ref 8.6–10.4)
PTH: 32 pg/mL (ref 14–64)

## 2018-06-20 MED ORDER — RISEDRONATE SODIUM 150 MG PO TABS: 150.0000 mg | ORAL_TABLET | ORAL | 0 refills | Status: DC

## 2018-06-20 NOTE — Telephone Encounter (Signed)
Labs are all WNL prescription sent to the pharmacy. Patient advised prescription has been sent to the pharmacy. Patient reminded she will need labs one month after starting. Patient given labs hours.

## 2018-06-29 DIAGNOSIS — M19042 Primary osteoarthritis, left hand: Secondary | ICD-10-CM | POA: Insufficient documentation

## 2018-06-29 DIAGNOSIS — M170 Bilateral primary osteoarthritis of knee: Secondary | ICD-10-CM | POA: Insufficient documentation

## 2018-06-29 DIAGNOSIS — M8589 Other specified disorders of bone density and structure, multiple sites: Secondary | ICD-10-CM | POA: Insufficient documentation

## 2018-06-29 DIAGNOSIS — M19041 Primary osteoarthritis, right hand: Secondary | ICD-10-CM | POA: Insufficient documentation

## 2018-06-29 DIAGNOSIS — M5136 Other intervertebral disc degeneration, lumbar region: Secondary | ICD-10-CM | POA: Insufficient documentation

## 2018-06-29 DIAGNOSIS — Z9884 Bariatric surgery status: Secondary | ICD-10-CM | POA: Insufficient documentation

## 2018-06-29 NOTE — Progress Notes (Signed)
Office Visit Note  Patient: Anna Navarro             Date of Birth: 1956/04/04           MRN: [id number]             PCP: Maury Dus, MD Referring: Maury Dus, MD Visit Date: 07/13/2018 Occupation: @GUAROCC @  Subjective:  Other (patient reports fall on 06/23/2018, right thumb pain/swelling )   History of Present Illness: Anna Navarro is a 63 y.o. female with history of osteoporosis and osteoarthritis.  She states she started Actonel after the last visit and has been tolerating it well.  She fell at work on June 23, 2018 and injured her right thumb.  She states her right thumb swelling has gone down but she still have some pain and swelling in her right thumb.  She has some underlying discomfort in her hands due to osteoarthritis.  She also has knee joint discomfort due to osteoarthritis.  Lower back pain is tolerable.  Activities of Daily Living:  Patient reports morning stiffness for 0 minutes.   Patient Reports nocturnal pain.  Difficulty dressing/grooming: Denies Difficulty climbing stairs: Denies Difficulty getting out of chair: Reports Difficulty using hands for taps, buttons, cutlery, and/or writing: Denies  Review of Systems  Constitutional: Negative for fatigue.  HENT: Negative for mouth sores, mouth dryness and nose dryness.   Eyes: Negative for itching and dryness.  Respiratory: Negative for shortness of breath and wheezing.   Cardiovascular: Negative for chest pain and swelling in legs/feet.  Gastrointestinal: Negative for abdominal pain, constipation and diarrhea.  Endocrine: Negative for increased urination.  Genitourinary: Negative for painful urination.  Musculoskeletal: Positive for arthralgias and joint pain. Negative for joint swelling and morning stiffness.  Skin: Negative for rash.  Allergic/Immunologic: Negative for susceptible to infections.  Neurological: Negative for dizziness, light-headedness, headaches, memory loss and weakness.    Hematological: Negative for bruising/bleeding tendency.  Psychiatric/Behavioral: Negative for confusion. The patient is not nervous/anxious.     PMFS History:  Patient Active Problem List   Diagnosis Date Noted  . Osteopenia of multiple sites 06/29/2018  . Primary osteoarthritis of both hands 06/29/2018  . Primary osteoarthritis of both knees 06/29/2018  . DDD (degenerative disc disease), lumbar 06/29/2018  . Status post gastric bypass for obesity 06/29/2018  . Genetic testing 01/28/2015  . Rhinitis, allergic 11/02/2014  . Hyperlipemia 11/02/2014  . 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 History of breast cancer 08/18/2012  . GERD 11/12/2006    Past Medical History:  Diagnosis Date  . Allergic rhinitis 02/03/2007  . Breast cancer ([place]) 2009   IDC of UOQ of right breast; ER/PR+, Her2-, Ki67 10%; s/p lumpectomy and radiation 2009  . Chronic eustachian tube dysfunction 05/23/2010  . DDD (degenerative disc disease), lumbar    seeing Dr. Ellene Route  . Depression 02/03/2007  . GERD 11/12/2006  . H/O iron deficiency anemia 05/09/2007   per pt from grastic bypass  . Hyperlipemia 11/12/2006  . Low back pain 02/03/2007  . Neck mass 01/03/2007  . Osteoporosis   . Personal history of radiation therapy 2009  . UTI (lower urinary tract infection) 11/12/2006  . Vestibular neuritis   . Warts 01/07/2010   hands    Family History  Problem Relation Age of Onset  . Heart attack Father   . Other Mother        bilateral mastectomies at 45y due to precancerous tumors  . Breast cancer Mother   . Healthy  Mother   . Mental illness Sister   . Healthy Son   . Healthy Son   . Healthy Daughter   . Breast cancer Maternal Aunt        dx. early 4s  . Heart Problems Maternal Aunt   . Lung cancer Maternal Uncle        dx. 62-62; former smoker  . Lung cancer Maternal Grandmother        dx. 20s; not a smoker; "metastasis to colon"  . Brain cancer Other        dx. >50s  . Cancer Other        dx. <50s;  unknown cancer/dx. 90s; unknown type   Past Surgical History:  Procedure Laterality Date  . BREAST BIOPSY Right 2009   High Risk Stereo Biopsy   . BREAST LUMPECTOMY Right 2009  . BREAST LUMPECTOMY WITH AXILLARY LYMPH NODE BIOPSY Right 08/2007   RT, ER/PR+, T1N0  . 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 KNEE SURGERY Left   . 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 Td 05/18/1993     Objective: Vital Signs: BP 131/76 (BP Location: Left Arm, Patient Position: Sitting, Cuff Size: Normal)   Pulse 69   Resp 12   Ht 5' 1"  (1.549 m)   Wt 154 lb 6.4 oz (70 kg)   BMI 29.17 kg/m    Physical Exam Vitals signs and nursing note reviewed.  Constitutional:      Appearance: She is well-developed.  HENT:     Head: Normocephalic and atraumatic.  Eyes:     Conjunctiva/sclera: Conjunctivae normal.  Neck:     Musculoskeletal: Normal range of motion.  Cardiovascular:     Rate and Rhythm: Normal rate and regular rhythm.     Heart sounds: Normal heart sounds.  Pulmonary:     Effort: Pulmonary effort is normal.     Breath sounds: Normal breath sounds.  Abdominal:     General: Bowel sounds are normal.     Palpations: Abdomen is soft.  Lymphadenopathy:     Cervical: No cervical adenopathy.  Skin:    General: Skin is warm and dry.     Capillary Refill: Capillary refill takes less than 2 seconds.  Neurological:     Mental Status: She is alert and oriented to person, place, and time.  Psychiatric:        Behavior: Behavior normal.      Musculoskeletal Exam: C-spine, thoracic and lumbar spine good range of motion.  Shoulder joints elbow joints wrist joints MCPs were in  good range of motion.  She has some thickening of PIP and DIP joints.  She has swelling of her right with tenderness over right MCP and proximal phalanx.  Hip joints, knee joints, ankles MTPs PIPs with good range of motion with no synovitis.  CDAI Exam: CDAI Score: Not documented Patient Global Assessment: Not documented; Provider Global Assessment: Not documented Swollen: Not documented; Tender: Not documented Joint Exam   Not documented   There is currently no information documented on the homunculus. Go to the Rheumatology activity and complete the homunculus joint  exam.  Investigation: No additional findings.  Imaging: No results found.  Recent Labs: Lab Results  Component Value Date   WBC 5.6 12/13/2013   HGB 13.2 12/13/2013   PLT 226.0 12/13/2013   NA 143 06/15/2018   K 4.7 06/15/2018   CL 110 06/15/2018   CO2 26 06/15/2018   GLUCOSE 96 06/15/2018   BUN 19 06/15/2018   CREATININE 0.82 06/15/2018   BILITOT 0.3 06/15/2018   ALKPHOS 51 07/18/2013   AST 12 06/15/2018   ALT 13 06/15/2018   PROT 6.4 06/15/2018   PROT 6.3 06/15/2018   ALBUMIN 3.8 07/18/2013   CALCIUM 9.5 06/15/2018   CALCIUM 9.5 06/15/2018   GFRAA 88 06/15/2018  June 15, 2018 SPEP normal, PTH normal, calcium normal, vitamin D 53  Speciality Comments: No specialty comments available.  Procedures:  No procedures performed Allergies: Patient has no known allergies.   Assessment / Plan:     Visit Diagnoses: Osteopenia of multiple sites - Oct 01, 2017 T score of -2.4 with significant decrease in bone mass on comparison to previous bone density.  Use of gabapentin, Topamax and Prilosec.  After the last visit she was placed on Actonel.  She has been taking Actonel without any side effects.  She is also taking calcium and vitamin D.  Need for regular exercise was emphasized.  Medication monitoring encounter - Plan: CBC with Differential/Platelet, COMPLETE METABOLIC PANEL WITH GFR  Pain of right  thumb-she has pain and swelling of her right since the fall.  She had tenderness on palpation.  I offered x-ray but she declined.  She wants to get x-ray to work.  Primary osteoarthritis of both hands-joint protection and muscle strengthening was discussed.  Primary osteoarthritis of both knees - Limited extension of the left knee.  Patient is followed up by Dr. Noemi Chapel.  She continues to have some discomfort in her knee joints.  DDD (degenerative disc disease), lumbar-she is currently not having much discomfort in her back.  Other medical problems are listed as follows:  Vestibular migraine  Mixed hyperlipidemia  History of breast cancer  History of gastroesophageal reflux (GERD)  Status post gastric bypass for obesity   Orders: Orders Placed This Encounter  Procedures  . CBC with Differential/Platelet  . COMPLETE METABOLIC PANEL WITH GFR   No orders of the defined types were placed in this encounter.     Follow-Up Instructions: Return in about 6 months (around 01/11/2019) for Osteoporosis, Osteoarthritis.   Bo Merino, MD  Note - This record has been created using Editor, commissioning.  Chart creation errors have been sought, but may not always  have been located. Such creation errors do not reflect on  the standard of medical care.

## 2018-07-13 ENCOUNTER — Encounter: Payer: Self-pay | Admitting: Rheumatology

## 2018-07-13 ENCOUNTER — Ambulatory Visit: Payer: BC Managed Care – PPO | Admitting: Rheumatology

## 2018-07-13 VITALS — BP 131/76 | HR 69 | Resp 12 | Ht 61.0 in | Wt 154.4 lb

## 2018-07-13 DIAGNOSIS — M19041 Primary osteoarthritis, right hand: Secondary | ICD-10-CM | POA: Diagnosis not present

## 2018-07-13 DIAGNOSIS — M8589 Other specified disorders of bone density and structure, multiple sites: Secondary | ICD-10-CM

## 2018-07-13 DIAGNOSIS — Z8719 Personal history of other diseases of the digestive system: Secondary | ICD-10-CM

## 2018-07-13 DIAGNOSIS — M19042 Primary osteoarthritis, left hand: Secondary | ICD-10-CM

## 2018-07-13 DIAGNOSIS — Z9884 Bariatric surgery status: Secondary | ICD-10-CM

## 2018-07-13 DIAGNOSIS — M5136 Other intervertebral disc degeneration, lumbar region: Secondary | ICD-10-CM

## 2018-07-13 DIAGNOSIS — M170 Bilateral primary osteoarthritis of knee: Secondary | ICD-10-CM

## 2018-07-13 DIAGNOSIS — Z853 Personal history of malignant neoplasm of breast: Secondary | ICD-10-CM

## 2018-07-13 DIAGNOSIS — G43109 Migraine with aura, not intractable, without status migrainosus: Secondary | ICD-10-CM

## 2018-07-13 DIAGNOSIS — M79644 Pain in right finger(s): Secondary | ICD-10-CM

## 2018-07-13 DIAGNOSIS — Z5181 Encounter for therapeutic drug level monitoring: Secondary | ICD-10-CM

## 2018-07-13 DIAGNOSIS — E782 Mixed hyperlipidemia: Secondary | ICD-10-CM

## 2018-07-13 DIAGNOSIS — G43809 Other migraine, not intractable, without status migrainosus: Secondary | ICD-10-CM

## 2018-07-14 LAB — COMPLETE METABOLIC PANEL WITH GFR
AG Ratio: 1.7 (calc) (ref 1.0–2.5)
ALT: 12 U/L (ref 6–29)
AST: 13 U/L (ref 10–35)
Albumin: 3.8 g/dL (ref 3.6–5.1)
Alkaline phosphatase (APISO): 73 U/L (ref 37–153)
BUN: 23 mg/dL (ref 7–25)
CO2: 28 mmol/L (ref 20–32)
Calcium: 9.7 mg/dL (ref 8.6–10.4)
Chloride: 108 mmol/L (ref 98–110)
Creat: 0.79 mg/dL (ref 0.50–0.99)
GFR, Est African American: 92 mL/min/{1.73_m2} (ref >=60)
GFR, Est Non African American: 80 mL/min/{1.73_m2} (ref >=60)
Globulin: 2.3 g/dL (calc) (ref 1.9–3.7)
Glucose, Bld: 95 mg/dL (ref 65–99)
Potassium: 4.5 mmol/L (ref 3.5–5.3)
Sodium: 142 mmol/L (ref 135–146)
Total Bilirubin: 0.2 mg/dL (ref 0.2–1.2)
Total Protein: 6.1 g/dL (ref 6.1–8.1)

## 2018-07-14 LAB — CBC WITH DIFFERENTIAL/PLATELET
Absolute Monocytes: 419 cells/uL (ref 200–950)
Basophils Absolute: 71 cells/uL (ref 0–200)
Basophils Relative: 1.2 %
Eosinophils Absolute: 71 cells/uL (ref 15–500)
Eosinophils Relative: 1.2 %
HCT: 40.5 % (ref 35.0–45.0)
Hemoglobin: 13.4 g/dL (ref 11.7–15.5)
Lymphs Abs: 1569 cells/uL (ref 850–3900)
MCH: 30.5 pg (ref 27.0–33.0)
MCHC: 33.1 g/dL (ref 32.0–36.0)
MCV: 92.3 fL (ref 80.0–100.0)
MPV: 11.6 fL (ref 7.5–12.5)
Monocytes Relative: 7.1 %
Neutro Abs: 3770 cells/uL (ref 1500–7800)
Neutrophils Relative %: 63.9 %
Platelets: 255 10*3/uL (ref 140–400)
RBC: 4.39 10*6/uL (ref 3.80–5.10)
RDW: 12 % (ref 11.0–15.0)
Total Lymphocyte: 26.6 %
WBC: 5.9 10*3/uL (ref 3.8–10.8)

## 2018-07-18 ENCOUNTER — Ambulatory Visit: Payer: Self-pay

## 2018-07-18 ENCOUNTER — Other Ambulatory Visit: Payer: Self-pay | Admitting: Occupational Medicine

## 2018-07-18 DIAGNOSIS — M79644 Pain in right finger(s): Secondary | ICD-10-CM

## 2018-07-21 ENCOUNTER — Encounter: Payer: Self-pay | Admitting: Rheumatology

## 2018-07-22 MED ORDER — RISEDRONATE SODIUM 150 MG PO TABS: 150.0000 mg | ORAL_TABLET | ORAL | 0 refills | Status: DC

## 2018-07-22 NOTE — Telephone Encounter (Signed)
Last Visit: 07/13/18 Next Visit: 12/29/18 Labs: 07/13/18 WNL  Okay to refill per Dr. Deveshwar  

## 2018-07-25 ENCOUNTER — Other Ambulatory Visit: Payer: Self-pay | Admitting: Neurology

## 2018-09-28 ENCOUNTER — Other Ambulatory Visit: Payer: Self-pay | Admitting: Rheumatology

## 2018-09-28 NOTE — Telephone Encounter (Signed)
Last Visit: 07/13/18 Next Visit: 12/29/18 Labs: 07/13/18 WNL  Okay to refill per Dr. Estanislado Pandy

## 2018-09-30 ENCOUNTER — Other Ambulatory Visit: Payer: Self-pay

## 2018-09-30 ENCOUNTER — Telehealth: Payer: Self-pay | Admitting: Neurology

## 2018-09-30 MED ORDER — TOPIRAMATE 100 MG PO TABS: 200.0000 mg | ORAL_TABLET | Freq: Every day | ORAL | 4 refills | Status: DC

## 2018-09-30 NOTE — Telephone Encounter (Signed)
Patient called regarding needing a refill on her Topamax. [facility] [address]. Thanks

## 2018-10-11 ENCOUNTER — Other Ambulatory Visit: Payer: Self-pay | Admitting: Obstetrics and Gynecology

## 2018-10-11 DIAGNOSIS — Z1231 Encounter for screening mammogram for malignant neoplasm of breast: Secondary | ICD-10-CM

## 2018-11-28 ENCOUNTER — Other Ambulatory Visit: Payer: Self-pay

## 2018-11-28 ENCOUNTER — Ambulatory Visit
Admission: RE | Admit: 2018-11-28 | Discharge: 2018-11-28 | Disposition: A | Discharge status: Home / Self Care | Payer: BC Managed Care – PPO | Source: Ambulatory Visit | Attending: Obstetrics and Gynecology | Admitting: Obstetrics and Gynecology

## 2018-11-28 DIAGNOSIS — Z1231 Encounter for screening mammogram for malignant neoplasm of breast: Secondary | ICD-10-CM

## 2018-12-15 NOTE — Progress Notes (Deleted)
Office Visit Note  Patient: Anna Navarro             Date of Birth: 05/13/1956           MRN: [id number]             PCP: Maury Dus, MD Referring: Maury Dus, MD Visit Date: 12/29/2018 Occupation: @GUAROCC @  Subjective:  No chief complaint on file.  She is on Actonel 150 mg monthly. She was previously treated with Fosamax and Evista but could not tolerate due to bone pain.  Last DEXA on 10/04/2017 showed T score -2.4 at left femur neck and greater than 5% BMD since 2017. Due for repeat DEXA in May 2021.    History of Present Illness: Aybree Lanyon is a 63 y.o. female ***   Activities of Daily Living:  Patient reports morning stiffness for *** {minute/hour:19697}.   Patient {ACTIONS;DENIES/REPORTS:21021675::"Denies"} nocturnal pain.  Difficulty dressing/grooming: {ACTIONS;DENIES/REPORTS:21021675::"Denies"} Difficulty climbing stairs: {ACTIONS;DENIES/REPORTS:21021675::"Denies"} Difficulty getting out of chair: {ACTIONS;DENIES/REPORTS:21021675::"Denies"} Difficulty using hands for taps, buttons, cutlery, and/or writing: {ACTIONS;DENIES/REPORTS:21021675::"Denies"}  No Rheumatology ROS completed.   PMFS History:  Patient Active Problem List   Diagnosis Date Noted  . Osteopenia of multiple sites 06/29/2018  . Primary osteoarthritis of both hands 06/29/2018  . Primary osteoarthritis of both knees 06/29/2018  . DDD (degenerative disc disease), lumbar 06/29/2018  . Status post gastric bypass for obesity 06/29/2018  . Genetic testing 01/28/2015  . Rhinitis, allergic 11/02/2014  . Hyperlipemia 11/02/2014  . 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 History of breast cancer 08/18/2012  . GERD 11/12/2006    Past Medical History:  Diagnosis Date  . Allergic rhinitis 02/03/2007  . Breast cancer ([place]) 2009   IDC of UOQ of right breast; ER/PR+, Her2-, Ki67 10%; s/p lumpectomy and radiation 2009  . Chronic eustachian tube dysfunction 05/23/2010  . DDD (degenerative disc  disease), lumbar    seeing Dr. Ellene Route  . Depression 02/03/2007  . GERD 11/12/2006  . H/O iron deficiency anemia 05/09/2007   per pt from grastic bypass  . Hyperlipemia 11/12/2006  . Low back pain 02/03/2007  . Neck mass 01/03/2007  . Osteoporosis   . Personal history of radiation therapy 2009  . UTI (lower urinary tract infection) 11/12/2006  . Vestibular neuritis   . Warts 01/07/2010   hands    Family History  Problem Relation Age of Onset  . Heart attack Father   . Other Mother        bilateral mastectomies at 45y due to precancerous tumors  . Breast cancer Mother   . Healthy Mother   . Mental illness Sister   . Healthy Son   . Healthy Son   . Healthy Daughter   . Breast cancer Maternal Aunt        dx. early 4s  . Heart Problems Maternal Aunt   . Lung cancer Maternal Uncle        dx. 62-62; former smoker  . Lung cancer Maternal Grandmother        dx. 59s; not a smoker; "metastasis to colon"  . Brain cancer Other        dx. >50s  . Cancer Other        dx. <50s; unknown cancer/dx. 79s; unknown type   Past Surgical History:  Procedure Laterality Date  . BREAST BIOPSY Right 2009   High Risk Stereo Biopsy   . BREAST LUMPECTOMY Right 2009  . BREAST LUMPECTOMY WITH AXILLARY LYMPH NODE BIOPSY Right 08/2007   RT,  ER/PR+, T1N0  . 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 KNEE SURGERY Left   . 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 Td 05/18/1993     Objective: Vital Signs: There were no vitals taken for this visit.   Physical Exam   Musculoskeletal Exam: ***  CDAI Exam: CDAI Score: - Patient Global: -; Provider Global: - Swollen: -; Tender: -  Joint Exam   No joint exam has been documented for this visit   There is currently no information documented on the homunculus. Go to the Rheumatology activity and complete the homunculus joint exam.  Investigation: No additional findings.  Imaging: Mm 3d Screen Breast Bilateral  Result Date: 11/30/2018 CLINICAL DATA:  Screening. EXAM: DIGITAL SCREENING BILATERAL MAMMOGRAM WITH TOMO AND CAD COMPARISON:  Previous exam(s). ACR Breast Density Category b: There are scattered areas of fibroglandular density. FINDINGS: There are no findings suspicious for malignancy. Images were processed with CAD. IMPRESSION: No mammographic evidence of malignancy. A result letter of this screening mammogram will be mailed directly to the patient. RECOMMENDATION: Screening mammogram in one year. (Code:SM-B-01Y) BI-RADS CATEGORY  1: Negative. Electronically Signed   By: Kristopher Oppenheim M.D.   On: 11/30/2018 09:55    Recent Labs: Lab Results  Component Value Date   WBC 5.9 07/13/2018   HGB 13.4 07/13/2018   PLT 255 07/13/2018   NA 142 07/13/2018   K 4.5 07/13/2018   CL 108 07/13/2018   CO2 28 07/13/2018   GLUCOSE 95 07/13/2018   BUN 23 07/13/2018   CREATININE 0.79 07/13/2018   BILITOT 0.2 07/13/2018   ALKPHOS 51 07/18/2013   AST 13 07/13/2018   ALT 12 07/13/2018   PROT 6.1 07/13/2018   ALBUMIN 3.8 07/18/2013   CALCIUM 9.7 07/13/2018   GFRAA 92 07/13/2018    Speciality Comments: No specialty comments available.  Procedures:  No procedures performed Allergies: Patient has no known allergies.   Assessment / Plan:     Visit Diagnoses: No diagnosis found.  Orders: No orders of the defined types were placed in this encounter.  No orders of the defined types were placed in this encounter.   Face-to-face time spent with patient was *** minutes. Greater than 50% of time was spent in counseling and coordination of care.  Follow-Up Instructions: No follow-ups on file.   Earnestine Mealing, CMA   Note - This record has been created using Editor, commissioning.  Chart creation errors have been sought, but may not always  have been located. Such creation errors do not reflect on  the standard of medical care.

## 2018-12-20 ENCOUNTER — Other Ambulatory Visit: Payer: Self-pay | Admitting: Rheumatology

## 2018-12-20 NOTE — Telephone Encounter (Signed)
Last Visit: 07/13/18 Next Visit: 12/29/18 Labs: 07/13/18 WNL  Okay to refill per Dr. Estanislado Pandy

## 2018-12-29 ENCOUNTER — Ambulatory Visit: Payer: Self-pay | Admitting: Rheumatology

## 2019-01-04 NOTE — Progress Notes (Deleted)
Office Visit Note  Patient: Anna Navarro             Date of Birth: 04-23-1956           MRN: [id number]             PCP: Maury Dus, MD Referring: Maury Dus, MD Visit Date: 01/18/2019 Occupation: @GUAROCC @  Subjective:  No chief complaint on file.   History of Present Illness: Anna Navarro is a 63 y.o. female ***   Activities of Daily Living:  Patient reports morning stiffness for *** {minute/hour:19697}.   Patient {ACTIONS;DENIES/REPORTS:21021675::"Denies"} nocturnal pain.  Difficulty dressing/grooming: {ACTIONS;DENIES/REPORTS:21021675::"Denies"} Difficulty climbing stairs: {ACTIONS;DENIES/REPORTS:21021675::"Denies"} Difficulty getting out of chair: {ACTIONS;DENIES/REPORTS:21021675::"Denies"} Difficulty using hands for taps, buttons, cutlery, and/or writing: {ACTIONS;DENIES/REPORTS:21021675::"Denies"}  No Rheumatology ROS completed.   PMFS History:  Patient Active Problem List   Diagnosis Date Noted  . Osteopenia of multiple sites 06/29/2018  . Primary osteoarthritis of both hands 06/29/2018  . Primary osteoarthritis of both knees 06/29/2018  . DDD (degenerative disc disease), lumbar 06/29/2018  . Status post gastric bypass for obesity 06/29/2018  . Genetic testing 01/28/2015  . Rhinitis, allergic 11/02/2014  . Hyperlipemia 11/02/2014  . 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 History of breast cancer 08/18/2012  . GERD 11/12/2006    Past Medical History:  Diagnosis Date  . Allergic rhinitis 02/03/2007  . Breast cancer ([place]) 2009   IDC of UOQ of right breast; ER/PR+, Her2-, Ki67 10%; s/p lumpectomy and radiation 2009  . Chronic eustachian tube dysfunction 05/23/2010  . DDD (degenerative disc disease), lumbar    seeing Dr. Ellene Route  . Depression 02/03/2007  . GERD 11/12/2006  . H/O iron deficiency anemia 05/09/2007   per pt from grastic bypass  . Hyperlipemia 11/12/2006  . Low back pain 02/03/2007  . Neck mass 01/03/2007  . Osteoporosis   .  Personal history of radiation therapy 2009  . UTI (lower urinary tract infection) 11/12/2006  . Vestibular neuritis   . Warts 01/07/2010   hands    Family History  Problem Relation Age of Onset  . Heart attack Father   . Other Mother        bilateral mastectomies at 45y due to precancerous tumors  . Breast cancer Mother   . Healthy Mother   . Mental illness Sister   . Healthy Son   . Healthy Son   . Healthy Daughter   . Breast cancer Maternal Aunt        dx. early 64s  . Heart Problems Maternal Aunt   . Lung cancer Maternal Uncle        dx. 62-62; former smoker  . Lung cancer Maternal Grandmother        dx. 38s; not a smoker; "metastasis to colon"  . Brain cancer Other        dx. >50s  . Cancer Other        dx. <50s; unknown cancer/dx. 55s; unknown type   Past Surgical History:  Procedure Laterality Date  . BREAST BIOPSY Right 2009   High Risk Stereo Biopsy   . BREAST LUMPECTOMY Right 2009  . BREAST LUMPECTOMY WITH AXILLARY LYMPH NODE BIOPSY Right 08/2007   RT, ER/PR+, T1N0  . 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 KNEE SURGERY Left   . 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 Td 05/18/1993     Objective: Vital Signs: There were no vitals taken for this visit.   Physical Exam   Musculoskeletal Exam: ***  CDAI Exam: CDAI Score: - Patient Global: -; Provider Global: - Swollen: -; Tender: - Joint Exam   No joint exam has been documented for this visit   There is currently no information documented on the homunculus. Go to the Rheumatology activity and complete the homunculus joint exam.  Investigation: No additional findings.  Imaging:  No results found.  Recent Labs: Lab Results  Component Value Date   WBC 5.9 07/13/2018   HGB 13.4 07/13/2018   PLT 255 07/13/2018   NA 142 07/13/2018   K 4.5 07/13/2018   CL 108 07/13/2018   CO2 28 07/13/2018   GLUCOSE 95 07/13/2018   BUN 23 07/13/2018   CREATININE 0.79 07/13/2018   BILITOT 0.2 07/13/2018   ALKPHOS 51 07/18/2013   AST 13 07/13/2018   ALT 12 07/13/2018   PROT 6.1 07/13/2018   ALBUMIN 3.8 07/18/2013   CALCIUM 9.7 07/13/2018   GFRAA 92 07/13/2018    Speciality Comments: No specialty comments available.  Procedures:  No procedures performed Allergies: Patient has no known allergies.   Assessment / Plan:     Visit Diagnoses: No diagnosis found.  Orders: No orders of the defined types were placed in this encounter.  No orders of the defined types were placed in this encounter.   Face-to-face time spent with patient was *** minutes. Greater than 50% of time was spent in counseling and coordination of care.  Follow-Up Instructions: No follow-ups on file.   Earnestine Mealing, CMA  Note - This record has been created using Editor, commissioning.  Chart creation errors have been sought, but may not always  have been located. Such creation errors do not reflect on  the standard of medical care.

## 2019-01-10 NOTE — Progress Notes (Signed)
Office Visit Note  Patient: Anna Navarro             Date of Birth: Jul 03, 1955           MRN: [id number]             PCP: Maury Dus, MD Referring: Maury Dus, MD Visit Date: 01/11/2019 Occupation: @GUAROCC @  Subjective:  Medication monitoring   History of Present Illness: Necha Harries is a 63 y.o. female with history of osteopenia and osteoarthritis.  She is taking Actonel 150 mg 1 tablet by mouth every 30 days. She is tolerating Actonel without any side effects. She takes a calcium and vitamin D supplement.  She denies any recent falls or fractures.  She has chronic pain in both knee joints.  She has swelling intermittently in both knee joints.  She has intermittent lower back pain dependent on her level of activity.  She walks for exercise, which helps with her lower back pain.  She takes aleve as needed for pain relief.  She denies any other joint pain or joint swelling.   Activities of Daily Living:  Patient reports morning stiffness for 5 minutes.   Patient Reports nocturnal pain.  Difficulty dressing/grooming: Denies Difficulty climbing stairs: Reports Difficulty getting out of chair: Reports Difficulty using hands for taps, buttons, cutlery, and/or writing: Denies  Review of Systems  Constitutional: Positive for fatigue.  HENT: Positive for mouth dryness. Negative for mouth sores and nose dryness.   Eyes: Negative for itching and dryness.  Respiratory: Negative for shortness of breath, wheezing and difficulty breathing.   Cardiovascular: Negative for chest pain and palpitations.  Gastrointestinal: Negative for abdominal pain, blood in stool and diarrhea.  Endocrine: Positive for excessive thirst. Negative for increased urination.  Genitourinary: Negative for difficulty urinating and painful urination.  Musculoskeletal: Positive for morning stiffness. Negative for arthralgias, joint pain and joint swelling.  Skin: Negative for rash, hair loss and  redness.  Allergic/Immunologic: Negative for susceptible to infections.  Neurological: Positive for weakness. Negative for dizziness, light-headedness, numbness, headaches and memory loss.  Hematological: Positive for bruising/bleeding tendency.  Psychiatric/Behavioral: Positive for sleep disturbance. Negative for confusion. The patient is not nervous/anxious.     PMFS History:  Patient Active Problem List   Diagnosis Date Noted  . Osteopenia of multiple sites 06/29/2018  . Primary osteoarthritis of both hands 06/29/2018  . Primary osteoarthritis of both knees 06/29/2018  . DDD (degenerative disc disease), lumbar 06/29/2018  . Status post gastric bypass for obesity 06/29/2018  . Genetic testing 01/28/2015  . Rhinitis, allergic 11/02/2014  . Hyperlipemia 11/02/2014  . 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 History of breast cancer 08/18/2012  . GERD 11/12/2006    Past Medical History:  Diagnosis Date  . Allergic rhinitis 02/03/2007  . Breast cancer ([place]) 2009   IDC of UOQ of right breast; ER/PR+, Her2-, Ki67 10%; s/p lumpectomy and radiation 2009  . Chronic eustachian tube dysfunction 05/23/2010  . DDD (degenerative disc disease), lumbar    seeing Dr. Ellene Route  . Depression 02/03/2007  . GERD 11/12/2006  . H/O iron deficiency anemia 05/09/2007   per pt from grastic bypass  . Hyperlipemia 11/12/2006  . Low back pain 02/03/2007  . Neck mass 01/03/2007  . Osteoporosis   . Personal history of radiation therapy 2009  . UTI (lower urinary tract infection) 11/12/2006  . Vestibular neuritis   . Warts 01/07/2010   hands    Family History  Problem Relation Age of Onset  .  Heart attack Father   . Other Mother        bilateral mastectomies at 45y due to precancerous tumors  . Breast cancer Mother   . Healthy Mother   . Mental illness Sister   . Healthy Son   . Healthy Son   . Healthy Daughter   . Breast cancer Maternal Aunt        dx. early 77s  . Heart Problems Maternal Aunt   . Lung  cancer Maternal Uncle        dx. 62-62; former smoker  . Lung cancer Maternal Grandmother        dx. 31s; not a smoker; "metastasis to colon"  . Brain cancer Other        dx. >50s  . Cancer Other        dx. <50s; unknown cancer/dx. 37s; unknown type   Past Surgical History:  Procedure Laterality Date  . BREAST BIOPSY Right 2009   High Risk Stereo Biopsy   . BREAST LUMPECTOMY Right 2009  . BREAST LUMPECTOMY WITH AXILLARY LYMPH NODE BIOPSY Right 08/2007   RT, ER/PR+, T1N0  . 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 KNEE SURGERY Left   . 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 Td 05/18/1993     Objective: Vital Signs: BP 124/77 (BP Location: Left Arm, Patient Position: Sitting, Cuff Size: Normal)   Pulse 77   Resp 12   Ht 5' 1"  (1.549 m)   Wt 157 lb (71.2 kg)   BMI 29.66 kg/m    Physical Exam Vitals signs and nursing note reviewed.  Constitutional:      Appearance: She is well-developed.  HENT:     Head: Normocephalic and atraumatic.  Eyes:     Conjunctiva/sclera: Conjunctivae normal.  Neck:     Musculoskeletal: Normal range of motion.  Cardiovascular:     Rate and Rhythm: Normal rate and regular rhythm.     Heart sounds: Normal heart sounds.  Pulmonary:     Effort: Pulmonary effort is normal.     Breath sounds: Normal breath sounds.  Abdominal:     General: Bowel sounds are normal.     Palpations: Abdomen is soft.  Lymphadenopathy:     Cervical: No cervical adenopathy.  Skin:    General: Skin is warm and dry.     Capillary Refill: Capillary refill takes less than 2 seconds.  Neurological:     Mental Status: She is alert and oriented to  person, place, and time.  Psychiatric:        Behavior: Behavior normal.      Musculoskeletal Exam: C-spine, thoracic spine, and lumbar spine good ROM.  No midline spinal tenderness.   Shoulder joints, elbow joints, wrist joints, MCPs, PIPs, DIPs good ROM with no synovitis.  Hip joints, knee joints, ankle joints, MTPs, PIPs, and DIPs with good ROM with no synovitis.  No warmth or effusion of knee joints.  No tenderness or swelling of ankle joints.  No tenderness over trochanteric bursa bilaterally.   CDAI Exam: CDAI Score: - Patient Global: -; Provider Global: - Swollen: -;  Tender: - Joint Exam   No joint exam has been documented for this visit   There is currently no information documented on the homunculus. Go to the Rheumatology activity and complete the homunculus joint exam.  Investigation: No additional findings.  Imaging: No results found.  Recent Labs: Lab Results  Component Value Date   WBC 5.9 07/13/2018   HGB 13.4 07/13/2018   PLT 255 07/13/2018   NA 142 07/13/2018   K 4.5 07/13/2018   CL 108 07/13/2018   CO2 28 07/13/2018   GLUCOSE 95 07/13/2018   BUN 23 07/13/2018   CREATININE 0.79 07/13/2018   BILITOT 0.2 07/13/2018   ALKPHOS 51 07/18/2013   AST 13 07/13/2018   ALT 12 07/13/2018   PROT 6.1 07/13/2018   ALBUMIN 3.8 07/18/2013   CALCIUM 9.7 07/13/2018   GFRAA 92 07/13/2018    Speciality Comments: No specialty comments available.  Procedures:  No procedures performed Allergies: Patient has no known allergies.   Assessment / Plan:     Visit Diagnoses: Osteopenia of multiple sites - Oct 01, 2017 T score of -2.4 with significant decrease in bone mass on comparison to previous bone density. She is tolerating Actonel 150 mg 1 tablet by mouth every 30 days.  She has not had any recent falls or fractures.  We discussed importance of resistive exercises.  She has been walking for exercise recently.  She will continue taking Actonel as prescribed.  She does not  need any refills at this time.  She will be due for an updated DEXA in May 2021.  She will be following up with her PCP tomorrow and she was advised to have a CMP with GFR drawn and faxed to our office.  Medication monitoring encounter - Use of gabapentin, Topamax and Prilosec. She has been taking Actonel without any side effects.  She is also takes calcium and vitamin D.   Primary osteoarthritis of both hands: She has no tenderness or synovitis on exam.  She has complete fist formation bilaterally.  Joint protection and muscle strengthening were discussed.  Primary osteoarthritis of both knees - Dr. Noemi Chapel: She has good range of motion of bilateral knee joints.  No warmth or effusion was noted.  She declined x-rays today.  She was advised to notify us if her knee joint pain worsens.  She takes Aleve as needed for pain relief.  DDD (degenerative disc disease), lumbar: She experiences intermittent lower back pain depending on her level of activity.  She has been walking for exercise which is been helping with her lower back pain.  Other medical conditions are listed as follows:  Vestibular migraine  Mixed hyperlipidemia  History of breast cancer  History of gastroesophageal reflux (GERD)  Status post gastric bypass for obesity  Orders: No orders of the defined types were placed in this encounter.  No orders of the defined types were placed in this encounter.    Follow-Up Instructions: Return in about 6 months (around 07/14/2019) for Osteopenia, Osteoarthritis.   Ofilia Neas, PA-C   I examined and evaluated the patient with Hazel Sams PA.  Patient gives history of intermittent discomfort in her right knee.  She has no warmth swelling or effusion in her knee joint today.  She declined x-ray.  We advised her to contact us in case she develops any increased symptoms.  The plan of care was discussed as noted above.  Bo Merino, MD  Note - This record has been created using  Dragon  software.  Chart creation errors have been sought, but may not always  have been located. Such creation errors do not reflect on  the standard of medical care.

## 2019-01-11 ENCOUNTER — Other Ambulatory Visit: Payer: Self-pay

## 2019-01-11 ENCOUNTER — Ambulatory Visit: Payer: BC Managed Care – PPO | Admitting: Rheumatology

## 2019-01-11 ENCOUNTER — Encounter: Payer: Self-pay | Admitting: Rheumatology

## 2019-01-11 VITALS — BP 124/77 | HR 77 | Resp 12 | Ht 61.0 in | Wt 157.0 lb

## 2019-01-11 DIAGNOSIS — Z5181 Encounter for therapeutic drug level monitoring: Secondary | ICD-10-CM

## 2019-01-11 DIAGNOSIS — M170 Bilateral primary osteoarthritis of knee: Secondary | ICD-10-CM

## 2019-01-11 DIAGNOSIS — M5136 Other intervertebral disc degeneration, lumbar region: Secondary | ICD-10-CM

## 2019-01-11 DIAGNOSIS — M19041 Primary osteoarthritis, right hand: Secondary | ICD-10-CM | POA: Diagnosis not present

## 2019-01-11 DIAGNOSIS — G43109 Migraine with aura, not intractable, without status migrainosus: Secondary | ICD-10-CM

## 2019-01-11 DIAGNOSIS — Z853 Personal history of malignant neoplasm of breast: Secondary | ICD-10-CM

## 2019-01-11 DIAGNOSIS — G43809 Other migraine, not intractable, without status migrainosus: Secondary | ICD-10-CM

## 2019-01-11 DIAGNOSIS — E782 Mixed hyperlipidemia: Secondary | ICD-10-CM

## 2019-01-11 DIAGNOSIS — M8589 Other specified disorders of bone density and structure, multiple sites: Secondary | ICD-10-CM

## 2019-01-11 DIAGNOSIS — Z8719 Personal history of other diseases of the digestive system: Secondary | ICD-10-CM

## 2019-01-11 DIAGNOSIS — M19042 Primary osteoarthritis, left hand: Secondary | ICD-10-CM

## 2019-01-11 DIAGNOSIS — Z9884 Bariatric surgery status: Secondary | ICD-10-CM

## 2019-01-11 NOTE — Patient Instructions (Signed)
Please request CMP w/ GFR to be drawn by PCP

## 2019-01-18 ENCOUNTER — Ambulatory Visit: Payer: BC Managed Care – PPO | Admitting: Rheumatology

## 2019-02-15 ENCOUNTER — Ambulatory Visit: Payer: BC Managed Care – PPO | Admitting: Neurology

## 2019-02-15 NOTE — Progress Notes (Signed)
Virtual Visit via Video Note The purpose of this virtual visit is to provide medical care while limiting exposure to the novel coronavirus.    Consent was obtained for video visit:  Yes.   Answered questions that patient had about telehealth interaction:  Yes.   I discussed the limitations, risks, security and privacy concerns of performing an evaluation and management service by telemedicine. I also discussed with the patient that there may be a patient responsible charge related to this service. The patient expressed understanding and agreed to proceed.  Pt location: Home Physician Location: Home Name of referring provider:  Maury Dus, MD I connected with Barbara Cower Plotz at patients initiation/request on 02/16/2019 at  8:50 AM EDT by video enabled telemedicine application and verified that I am speaking with the correct person using two identifiers. Pt MRN:  [id number] Pt DOB:  04-Mar-1956 Video Participants:  Barbara Cower Tyree   History of Present Illness:  Anna Navarro is a 63 year old right-handed woman with depression and history of breast cancer status post lumpectomy and radiation in 2009 who follows up for vestibular migraine.  UPDATE: Intensity:  moderate Duration:  Few hours Frequency:  3 to 4 times in past year Rescue protocol:  Diazepam 2.59m (less if taken at work due to concerns of drowsiness) and sometimes meclizine Current NSAIDS:  Naproxen 5052mCurrent analgesics:  no Current triptans:  no Current ergotamine:  no Current anti-emetic:  no Current muscle relaxants:  no Current anti-anxiolytic:  Diazepam 2.16m216mfor acute attacks of vertigo) Current sleep aide:  no Current Antihypertensive medications:  no Current Antidepressant medications:  Pristiq Current Anticonvulsant medications:  topiramate 200m59m bedtime; gabapentin 100mg316mbedtime Current anti-CGRP:  no Current Vitamins/Herbal/Supplements:  B12 Current Antihistamines/Decongestants:   meclizine Other therapy:  no Hormone/birth control:  Estradiol  Caffeine:  Very little.  No coffee Alcohol:  2 glasses of wine per week Smoker:  no Diet:  A few glasses of water Exercise:  yes Depression:  no; Anxiety:  no Other pain:  no Sleep hygiene:  Good   HISTORY: She began having recurrent episodes of dizziness in March 2015.She describes it as a sensation of movement, like during a drop on a roller coaster, as well as spinning.It occurs spontaneously and at rest, but can be aggravated with movement.Sometimes it is so severe, she was unable to drive and needed her husband to come and pick her up.She may feel a little quesy, but she denies any significant nausea and no vomiting.She endorses photophobia, phonophobia and osmophobia.She denies lightheadedness, vision loss, focal numbness or weakness, or slurred speech. It is associated with a dull, 6/10 non-throbbing holocephalic ache.There are no known triggers.Only rest and sleep help.Initially, at time of her first visit, t typically lasts 2-3 days and occurred once a week to once every other week.There are no specific relieving factors.She has taken meclizine, which is not too effective.Even though the spells are episodic, she didn't quite feel right in between spells.  She was [place].She had an MRI of the brain with and without contrast, which was reportedly normal.  She denies preceding viral illness or head trauma.She denies prior history of migraine.She denies family history of migraine.  Past Medical History: Past Medical History:  Diagnosis Date  . Allergic rhinitis 02/03/2007  . Breast cancer ([facility]) [id number]   IDC of UOQ of right breast; ER/PR+, Her2-, Ki67 10%; s/p lumpectomy and radiation 2009  .  Chronic eustachian tube dysfunction 05/23/2010  . DDD (degenerative disc disease), lumbar    seeing Dr. Ellene Route  .  Depression 02/03/2007  . GERD 11/12/2006  . H/O iron deficiency anemia 05/09/2007   per pt from grastic bypass  . Hyperlipemia 11/12/2006  . Low back pain 02/03/2007  . Neck mass 01/03/2007  . Osteoporosis   . Personal history of radiation therapy 2009  . UTI (lower urinary tract infection) 11/12/2006  . Vestibular neuritis   . Warts 01/07/2010   hands    Medications: Outpatient Encounter Medications as of 02/16/2019  Medication Sig  . Cholecalciferol (VITAMIN D3) 5000 UNITS TABS Take 1 capsule by mouth daily.  Marland Kitchen desvenlafaxine (PRISTIQ) 50 MG 24 hr tablet Take 1 tablet (50 mg total) by mouth daily.  . Estradiol 10 MCG TABS vaginal tablet Place 1 tablet (10 mcg total) vaginally 2 (two) times a week.  . gabapentin (NEURONTIN) 100 MG capsule take 1 capsule at night  . meclizine (ANTIVERT) 25 MG tablet Take 1 tablet (25 mg total) by mouth 3 (three) times daily as needed for dizziness.  Marland Kitchen omeprazole (PRILOSEC) 20 MG capsule Take 20 mg by mouth daily.  . risedronate (ACTONEL) 150 MG tablet TAKE 1 TABLET BY MOUTH ONCE EVERY 30 DAYS. TAKE WITH WATER ON EMPTY STOMACH NOTHING BY MOUTH OR LIE DOWN FOR NEXT 30 MINUTES  . topiramate (TOPAMAX) 100 MG tablet Take 2 tablets (200 mg total) by mouth at bedtime.  . vitamin B-12 (CYANOCOBALAMIN) 250 MCG tablet Take 250 mcg by mouth daily.   No facility-administered encounter medications on file as of 02/16/2019.     Allergies: No Known Allergies  Family History: Family History  Problem Relation Age of Onset  . Heart attack Father   . Other Mother        bilateral mastectomies at 45y due to precancerous tumors  . Breast cancer Mother   . Healthy Mother   . Mental illness Sister   . Healthy Son   . Healthy Son   . Healthy Daughter   . Breast cancer Maternal Aunt        dx. early 66s  . Heart Problems Maternal Aunt   . Lung cancer Maternal Uncle        dx. 62-62; former smoker  . Lung cancer Maternal Grandmother        dx. 78s; not a smoker;  "metastasis to colon"  . Brain cancer Other        dx. >50s  . Cancer Other        dx. <50s; unknown cancer/dx. 64s; unknown type    Social History: Social History   Socioeconomic History  . Marital status: Married    Spouse name: Not on file  . Number of children: Not on file  . Years of education: Not on file  . Highest education level: Not on file  Occupational History  . Not on file  Social Needs  . Financial resource strain: Not on file  . Food insecurity    Worry: Not on file    Inability: Not on file  . Transportation needs    Medical: Not on file    Non-medical: Not on file  Tobacco Use  . Smoking status: Never Smoker  . Smokeless tobacco: Never Used  Substance and Sexual Activity  . Alcohol use: Yes    Alcohol/week: 2.0 standard drinks    Types: 2 Glasses of wine per week    Comment: occ  . Drug use: No  .  Sexual activity: Yes    Partners: Male    Birth control/protection: Surgical  Lifestyle  . Physical activity    Days per week: Not on file    Minutes per session: Not on file  . Stress: Not on file  Relationships  . Social Herbalist on phone: Not on file    Gets together: Not on file    Attends religious service: Not on file    Active member of club or organization: Not on file    Attends meetings of clubs or organizations: Not on file    Relationship status: Not on file  . Intimate partner violence    Fear of current or ex partner: Not on file    Emotionally abused: Not on file    Physically abused: Not on file    Forced sexual activity: Not on file  Other Topics Concern  . 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 No acute distress.  Alert and  oriented.  Speech fluent and not dysarthric.  Language intact.  Eyes orthophoric on primary gaze.  Face symmetric.  Assessment and Plan:   Vestibular migraine  1.  Topiramate 248m at bedtime 2.  Diazepam and/or meclizine for abortive therapy of vertigo; naproxen for headache 3.  Limit use of pain relievers to no more than 2 days out of week to prevent risk of rebound or medication-overuse headache. 4.  Keep headache diary 5.  Follow up in one year  Follow Up Instructions:    -I discussed the assessment and treatment plan with the patient. The patient was provided an opportunity to ask questions and all were answered. The patient agreed with the plan and demonstrated an understanding of the instructions.   The patient was advised to call back or seek an in-person evaluation if the symptoms worsen or if the condition fails to improve as anticipated.    ADudley Major DO

## 2019-02-16 ENCOUNTER — Other Ambulatory Visit: Payer: Self-pay

## 2019-02-16 ENCOUNTER — Encounter: Payer: Self-pay | Admitting: Neurology

## 2019-02-16 ENCOUNTER — Telehealth (INDEPENDENT_AMBULATORY_CARE_PROVIDER_SITE_OTHER): Payer: BC Managed Care – PPO | Admitting: Neurology

## 2019-02-16 VITALS — Ht 61.5 in | Wt 148.0 lb

## 2019-02-16 DIAGNOSIS — G43809 Other migraine, not intractable, without status migrainosus: Secondary | ICD-10-CM

## 2019-02-16 MED ORDER — TOPIRAMATE 100 MG PO TABS: 200.0000 mg | ORAL_TABLET | Freq: Every day | ORAL | 3 refills | Status: DC

## 2019-02-16 MED ORDER — DIAZEPAM 5 MG PO TABS: ORAL_TABLET | ORAL | 2 refills | Status: DC

## 2019-03-17 ENCOUNTER — Other Ambulatory Visit: Payer: Self-pay

## 2019-03-20 ENCOUNTER — Other Ambulatory Visit: Payer: Self-pay

## 2019-03-20 ENCOUNTER — Encounter: Payer: Self-pay | Admitting: Obstetrics and Gynecology

## 2019-03-20 ENCOUNTER — Ambulatory Visit: Payer: BC Managed Care – PPO | Admitting: Obstetrics and Gynecology

## 2019-03-20 VITALS — BP 108/74 | HR 58 | Temp 98.0°F | Ht 61.0 in | Wt 157.2 lb

## 2019-03-20 DIAGNOSIS — Z853 Personal history of malignant neoplasm of breast: Secondary | ICD-10-CM | POA: Diagnosis not present

## 2019-03-20 DIAGNOSIS — M8580 Other specified disorders of bone density and structure, unspecified site: Secondary | ICD-10-CM | POA: Diagnosis not present

## 2019-03-20 DIAGNOSIS — N952 Postmenopausal atrophic vaginitis: Secondary | ICD-10-CM | POA: Diagnosis not present

## 2019-03-20 DIAGNOSIS — Z01419 Encounter for gynecological examination (general) (routine) without abnormal findings: Secondary | ICD-10-CM

## 2019-03-20 MED ORDER — ESTRADIOL 10 MCG VA TABS: 1.0000 | ORAL_TABLET | VAGINAL | 3 refills | Status: DC

## 2019-03-20 NOTE — Progress Notes (Signed)
63 y.o. [id number] Married Declined Declined female here for annual exam.  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 Just occasionally sexually active, slight discomfort, uses a lubricant.     H/O right breast cancer in 2009.  H/O hysterectomy.   H/O osteopenia with elevated risk of fracture, had side effects with Evista, prior issues with Fosamax (muscle aching with both). She was seen by Rheumatology and started on monthly Actonel, tolerating it. She does get muscle aches for about a week after taking it.   No LMP recorded. Patient has had a hysterectomy.          Sexually active: Yes.    The current method of family planning is post menopausal status.    Exercising: Yes.    Walking abut 2.5 miles a day  Smoker:  no  Health Maintenance: Pap:  2006 History of abnormal Pap:  no MMG:  11/30/18  Density B Birads 1 neg  BMD:  10/01/2017 Osteopenia T score -2.4. FRAX 19.3%/3.6%.  Colonoscopy: 12/19 follow up in 3 years, polypa.  TDaP:  02/2019 Gardasil: no   reports that she has never smoked. She has never used smokeless tobacco. She reports current alcohol use of about 2.0 standard drinks of alcohol per week. She reports that she does not use drugs. 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 Her children are local. No grandchildren  Past Medical History:  Diagnosis Date  . Allergic rhinitis 02/03/2007  . Breast cancer ([place]) 2009   IDC of UOQ of right breast; ER/PR+, Her2-, Ki67 10%; s/p lumpectomy and radiation 2009  . Chronic eustachian tube dysfunction 05/23/2010  . DDD (degenerative disc disease), lumbar    seeing Dr. Ellene Route  . Depression 02/03/2007  . GERD 11/12/2006  . H/O iron deficiency anemia 05/09/2007   per pt from grastic bypass  . Hyperlipemia 11/12/2006  . Low back pain 02/03/2007  . Neck mass 01/03/2007  . Osteoporosis   . Personal history of radiation therapy 2009  . UTI (lower urinary tract infection) 11/12/2006  . Vestibular neuritis   . Warts  01/07/2010   hands    Past Surgical History:  Procedure Laterality Date  . BREAST BIOPSY Right 2009   High Risk Stereo Biopsy   . BREAST LUMPECTOMY Right 2009  . BREAST LUMPECTOMY WITH AXILLARY LYMPH NODE BIOPSY Right 08/2007   RT, ER/PR+, T1N0  . 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 KNEE SURGERY Left   . TOTAL ABDOMINAL HYSTERECTOMY      Current Outpatient Medications  Medication Sig Dispense Refill  . Cholecalciferol (VITAMIN D3) 5000 UNITS TABS Take 1 capsule by mouth daily. 30 tablet   . desvenlafaxine (PRISTIQ) 50 MG 24 hr tablet Take 1 tablet (50 mg total) by mouth daily. 90 tablet 3  . diazepam (VALIUM) 5 MG tablet Take 0.5 tab every 6 hours as needed for dizziness.  Caution for drowsiness. 15 tablet 2  . Estradiol 10 MCG TABS vaginal tablet Place 1 tablet (10 mcg total) vaginally 2 (two) times a week. 24 tablet 3  . gabapentin (NEURONTIN) 100 MG capsule take 1 capsule at night 90 capsule 3  . meclizine (ANTIVERT) 25 MG tablet Take 1 tablet (25 mg total) by mouth 3 (three) times daily as needed for dizziness. 30 tablet 0  . omeprazole (PRILOSEC) 20 MG capsule Take 20 mg by mouth daily.    . risedronate (ACTONEL) 150 MG tablet TAKE 1 TABLET BY MOUTH ONCE EVERY 30 DAYS. TAKE WITH  WATER ON EMPTY STOMACH NOTHING BY MOUTH OR LIE DOWN FOR NEXT 30 MINUTES 3 tablet 0  . topiramate (TOPAMAX) 100 MG tablet Take 2 tablets (200 mg total) by mouth at bedtime. 180 tablet 3  . vitamin B-12 (CYANOCOBALAMIN) 250 MCG tablet Take 250 mcg by mouth daily.     No current facility-administered medications for this visit.     Family History  Problem Relation Age of Onset  . Heart attack Father   . Other Mother        bilateral mastectomies at 45y due to precancerous tumors  . Breast cancer Mother   . Healthy Mother   . Mental illness Sister   . Healthy Son   . Healthy Son   . Healthy Daughter   . Breast cancer Maternal Aunt        dx. early 56s  . Heart Problems  Maternal Aunt   . Lung cancer Maternal Uncle        dx. 62-62; former smoker  . Lung cancer Maternal Grandmother        dx. 42s; not a smoker; "metastasis to colon"  . Brain cancer Other        dx. >50s  . Cancer Other        dx. <50s; unknown cancer/dx. 53s; unknown type    Review of Systems  All other systems reviewed and are negative.   Exam:   There were no vitals taken for this visit.  Weight change: @WEIGHTCHANGE @ Height:      Ht Readings from Last 3 Encounters:  02/16/19 5' 1.5" (1.562 m)  01/11/19 5' 1"  (1.549 m)  07/13/18 5' 1"  (1.549 m)    General appearance: alert, cooperative and appears stated age Head: Normocephalic, without obvious abnormality, atraumatic Neck: no adenopathy, supple, symmetrical, trachea midline and thyroid normal to inspection and palpation Lungs: clear to auscultation bilaterally Cardiovascular: regular rate and rhythm Breasts: normal appearance, no masses or tenderness, evidence of right lumpectomy Abdomen: soft, non-tender; non distended,  no masses,  no organomegaly Extremities: extremities normal, atraumatic, no cyanosis or edema Skin: Skin color, texture, turgor normal. No rashes or lesions Lymph nodes: Cervical, supraclavicular, and axillary nodes normal. No abnormal inguinal nodes palpated Neurologic: Grossly normal   Pelvic: External genitalia:  no lesions              Urethra:  normal appearing urethra with no masses, tenderness or lesions              Bartholins and Skenes: normal                 Vagina: mildly appearing vagina with normal color and discharge, no lesions              Cervix: absent               Bimanual Exam:  Uterus:  uterus absent              Adnexa: no mass, fullness, tenderness               Rectovaginal: Confirms               Anus:  normal sphincter tone, no lesions  Chaperone was present for exam.  A:  Well Woman with normal exam  H/O breast cancer  H/O vaginal atrophy, on low dose vaginal  estrogen (Oncologist aware)  P:   No pap needed  Mammogram and colonoscopy UTD  Discussed breast self exam  Discussed calcium  and vit D intake  Refill vagifem.

## 2019-03-20 NOTE — Patient Instructions (Signed)
EXERCISE AND DIET:  We recommended that you start or continue a regular exercise program for good health. Regular exercise means any activity that makes your heart beat faster and makes you sweat.  We recommend exercising at least 30 minutes per day at least 3 days a week, preferably 4 or 5.  We also recommend a diet low in fat and sugar.  Inactivity, poor dietary choices and obesity can cause diabetes, heart attack, stroke, and kidney damage, among others.    ALCOHOL AND SMOKING:  Women should limit their alcohol intake to no more than 7 drinks/beers/glasses of wine (combined, not each!) per week. Moderation of alcohol intake to this level decreases your risk of breast cancer and liver damage. And of course, no recreational drugs are part of a healthy lifestyle.  And absolutely no smoking or even second hand smoke. Most people know smoking can cause heart and lung diseases, but did you know it also contributes to weakening of your bones? Aging of your skin?  Yellowing of your teeth and nails?  CALCIUM AND VITAMIN D:  Adequate intake of calcium and Vitamin D are recommended.  The recommendations for exact amounts of these supplements seem to change often, but generally speaking 1,200 mg of calcium (between diet and supplement) and 800 units of Vitamin D per day seems prudent. Certain women may benefit from higher intake of Vitamin D.  If you are among these women, your doctor will have told you during your visit.    PAP SMEARS:  Pap smears, to check for cervical cancer or precancers,  have traditionally been done yearly, although recent scientific advances have shown that most women can have pap smears less often.  However, every woman still should have a physical exam from her gynecologist every year. It will include a breast check, inspection of the vulva and vagina to check for abnormal growths or skin changes, a visual exam of the cervix, and then an exam to evaluate the size and shape of the uterus and  ovaries.  And after 63 years of age, a rectal exam is indicated to check for rectal cancers. We will also provide age appropriate advice regarding health maintenance, like when you should have certain vaccines, screening for sexually transmitted diseases, bone density testing, colonoscopy, mammograms, etc.   MAMMOGRAMS:  All women over 40 years old should have a yearly mammogram. Many facilities now offer a "3D" mammogram, which may cost around $50 extra out of pocket. If possible,  we recommend you accept the option to have the 3D mammogram performed.  It both reduces the number of women who will be called back for extra views which then turn out to be normal, and it is better than the routine mammogram at detecting truly abnormal areas.    COLON CANCER SCREENING: Now recommend starting at age 45. At this time colonoscopy is not covered for routine screening until 50. There are take home tests that can be done between 45-49.   COLONOSCOPY:  Colonoscopy to screen for colon cancer is recommended for all women at age 50.  We know, you hate the idea of the prep.  We agree, BUT, having colon cancer and not knowing it is worse!!  Colon cancer so often starts as a polyp that can be seen and removed at colonscopy, which can quite literally save your life!  And if your first colonoscopy is normal and you have no family history of colon cancer, most women don't have to have it again for   10 years.  Once every ten years, you can do something that may end up saving your life, right?  We will be happy to help you get it scheduled when you are ready.  Be sure to check your insurance coverage so you understand how much it will cost.  It may be covered as a preventative service at no cost, but you should check your particular policy.      Breast Self-Awareness Breast self-awareness means being familiar with how your breasts look and feel. It involves checking your breasts regularly and reporting any changes to your  health care provider. Practicing breast self-awareness is important. A change in your breasts can be a sign of a serious medical problem. Being familiar with how your breasts look and feel allows you to find any problems early, when treatment is more likely to be successful. All women should practice breast self-awareness, including women who have had breast implants. How to do a breast self-exam One way to learn what is normal for your breasts and whether your breasts are changing is to do a breast self-exam. To do a breast self-exam: Look for Changes  1. Remove all the clothing above your waist. 2. Stand in front of a mirror in a room with good lighting. 3. Put your hands on your hips. 4. Push your hands firmly downward. 5. Compare your breasts in the mirror. Look for differences between them (asymmetry), such as: ? Differences in shape. ? Differences in size. ? Puckers, dips, and bumps in one breast and not the other. 6. Look at each breast for changes in your skin, such as: ? Redness. ? Scaly areas. 7. Look for changes in your nipples, such as: ? Discharge. ? Bleeding. ? Dimpling. ? Redness. ? A change in position. Feel for Changes Carefully feel your breasts for lumps and changes. It is best to do this while lying on your back on the floor and again while sitting or standing in the shower or tub with soapy water on your skin. Feel each breast in the following way:  Place the arm on the side of the breast you are examining above your head.  Feel your breast with the other hand.  Start in the nipple area and make  inch (2 cm) overlapping circles to feel your breast. Use the pads of your three middle fingers to do this. Apply light pressure, then medium pressure, then firm pressure. The light pressure will allow you to feel the tissue closest to the skin. The medium pressure will allow you to feel the tissue that is a little deeper. The firm pressure will allow you to feel the tissue  close to the ribs.  Continue the overlapping circles, moving downward over the breast until you feel your ribs below your breast.  Move one finger-width toward the center of the body. Continue to use the  inch (2 cm) overlapping circles to feel your breast as you move slowly up toward your collarbone.  Continue the up and down exam using all three pressures until you reach your armpit.  Write Down What You Find  Write down what is normal for each breast and any changes that you find. Keep a written record with breast changes or normal findings for each breast. By writing this information down, you do not need to depend only on memory for size, tenderness, or location. Write down where you are in your menstrual cycle, if you are still menstruating. If you are having trouble noticing differences   in your breasts, do not get discouraged. With time you will become more familiar with the variations in your breasts and more comfortable with the exam. How often should I examine my breasts? Examine your breasts every month. If you are breastfeeding, the best time to examine your breasts is after a feeding or after using a breast pump. If you menstruate, the best time to examine your breasts is 5-7 days after your period is over. During your period, your breasts are lumpier, and it may be more difficult to notice changes. When should I see my health care provider? See your health care provider if you notice:  A change in shape or size of your breasts or nipples.  A change in the skin of your breast or nipples, such as a reddened or scaly area.  Unusual discharge from your nipples.  A lump or thick area that was not there before.  Pain in your breasts.  Anything that concerns you.  

## 2019-05-22 ENCOUNTER — Other Ambulatory Visit: Payer: Self-pay | Admitting: Rheumatology

## 2019-05-22 NOTE — Telephone Encounter (Signed)
ok 

## 2019-05-22 NOTE — Telephone Encounter (Signed)
Last Visit: 01/11/19 Next Visit: 07/13/19 Labs: 01/12/19 CMP WNL  Okay to refill Actonel?

## 2019-07-10 NOTE — Progress Notes (Signed)
Office Visit Note  Patient: Anna Navarro             Date of Birth: 1956-03-01           MRN: [id number]             PCP: Maury Dus, MD Referring: Maury Dus, MD Visit Date: 07/13/2019 Occupation: @GUAROCC @  Subjective:  Medication management  History of Present Illness: Yareli Carthen is a 64 y.o. female with a past medical history of osteopenia and osteoporosis. She is currently taking Actonel 150 mg 1 tablet by mouth every 30 days as well as vitamin d and calcium supplementation. She is currently tolerating all of these well. She denies any falls and fractures at this time. She endorses back pai nthat has worsened in intensity starting two and a half weeks ago. She states that there was not an obvious injury at that time, but believes that she may have twisted wrong or tweaked something. She states that it has been difficult to perform daily activities and she has been unable to do any exercising or stretches. Her pain is worsened with cold weather. She is currently scheduled to see an orthopedic doctor next week to further evaluate her back pain. She currently denies any other joint pain or any joint swelling.    Activities of Daily Living:  Patient reports morning stiffness for 10 minutes.   Patient Reports nocturnal pain.  Difficulty dressing/grooming: Denies Difficulty climbing stairs: Reports Difficulty getting out of chair: Reports Difficulty using hands for taps, buttons, cutlery, and/or writing: Reports  Review of Systems  Constitutional: Positive for fatigue. Negative for night sweats, weight gain and weight loss.  HENT: Negative for mouth sores, trouble swallowing, trouble swallowing, mouth dryness and nose dryness.   Eyes: Negative for pain, redness, itching, visual disturbance and dryness.  Respiratory: Negative for cough, hemoptysis, shortness of breath and difficulty breathing.   Cardiovascular: Negative for chest pain, palpitations,  hypertension, irregular heartbeat and swelling in legs/feet.  Gastrointestinal: Negative for blood in stool, constipation and diarrhea.  Endocrine: Negative for increased urination.  Genitourinary: Negative for difficulty urinating, painful urination and vaginal dryness.  Musculoskeletal: Positive for morning stiffness. Negative for arthralgias, joint pain, joint swelling, myalgias, muscle weakness, muscle tenderness and myalgias.  Skin: Negative for color change, pallor, rash, hair loss, nodules/bumps, skin tightness, ulcers and sensitivity to sunlight.  Allergic/Immunologic: Negative for susceptible to infections.  Neurological: Negative for dizziness, numbness, headaches, memory loss, night sweats and weakness.  Hematological: Negative for bruising/bleeding tendency and swollen glands.  Psychiatric/Behavioral: Positive for sleep disturbance. Negative for depressed mood and confusion. The patient is not nervous/anxious.     PMFS History:  Patient Active Problem List   Diagnosis Date Noted  . Osteopenia of multiple sites 06/29/2018  . Primary osteoarthritis of both hands 06/29/2018  . Primary osteoarthritis of both knees 06/29/2018  . DDD (degenerative disc disease), lumbar 06/29/2018  . Status post gastric bypass for obesity 06/29/2018  . Genetic testing 01/28/2015  . Rhinitis, allergic 11/02/2014  . Hyperlipemia 11/02/2014  . 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 History of breast cancer 08/18/2012  . GERD 11/12/2006    Past Medical History:  Diagnosis Date  . Allergic rhinitis 02/03/2007  . Breast cancer ([place]) 2009   IDC of UOQ of right breast; ER/PR+, Her2-, Ki67 10%; s/p lumpectomy and radiation 2009  . Chronic eustachian tube dysfunction 05/23/2010  . DDD (degenerative disc disease), lumbar    seeing Dr. Ellene Route  . Depression 02/03/2007  .  GERD 11/12/2006  . H/O iron deficiency anemia 05/09/2007   per pt from grastic bypass  . Hyperlipemia 11/12/2006  . Low back pain 02/03/2007  .  Neck mass 01/03/2007  . Osteoporosis   . Personal history of radiation therapy 2009  . UTI (lower urinary tract infection) 11/12/2006  . Vestibular neuritis   . Warts 01/07/2010   hands    Family History  Problem Relation Age of Onset  . Heart attack Father   . Other Mother        bilateral mastectomies at 45y due to precancerous tumors  . Breast cancer Mother   . Healthy Mother   . Mental illness Sister   . Healthy Son   . Healthy Son   . Healthy Daughter   . Breast cancer Maternal Aunt        dx. early 29s  . Heart Problems Maternal Aunt   . Lung cancer Maternal Uncle        dx. 62-62; former smoker  . Lung cancer Maternal Grandmother        dx. 42s; not a smoker; "metastasis to colon"  . Brain cancer Other        dx. >50s  . Cancer Other        dx. <50s; unknown cancer/dx. 72s; unknown type   Past Surgical History:  Procedure Laterality Date  . BREAST BIOPSY Right 2009   High Risk Stereo Biopsy   . BREAST LUMPECTOMY Right 2009  . BREAST LUMPECTOMY WITH AXILLARY LYMPH NODE BIOPSY Right 08/2007   RT, ER/PR+, T1N0  . 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 KNEE SURGERY Left   . 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 Td 05/18/1993     Objective: Vital Signs: BP 126/72 (BP Location: Left Arm, Patient Position: Sitting, Cuff Size: Normal)   Pulse 72   Resp 12   Ht 5' 1"  (1.549 m)   Wt 150 lb 9.6 oz (68.3 kg)   BMI 28.46 kg/m    Physical Exam Vitals and nursing note reviewed.  Constitutional:      Appearance: She is well-developed.  HENT:     Head: Normocephalic and atraumatic.  Eyes:     Conjunctiva/sclera: Conjunctivae normal.   Cardiovascular:     Rate and Rhythm: Normal rate and regular rhythm.     Heart sounds: Normal heart sounds.  Pulmonary:     Effort: Pulmonary effort is normal.     Breath sounds: Normal breath sounds.  Abdominal:     General: Bowel sounds are normal.     Palpations: Abdomen is soft.  Musculoskeletal:     Cervical back: Normal range of motion.  Lymphadenopathy:     Cervical: No cervical adenopathy.  Skin:    General: Skin is warm and dry.     Capillary Refill: Capillary refill takes less than 2 seconds.  Neurological:     Mental Status: She is alert and oriented to person, place, and time.  Psychiatric:        Behavior: Behavior normal.      Musculoskeletal Exam: C-spine, thoracic spine, and lumbar spine  with good ROM. No midline spinal tenderness or SI joint tenderness on exam. Shoulder joints, elbow joints, wrist joints, MCPs, PIPs, and DIPs with good ROM and no synovitis. No tenderness present. Hip joints, knee joints, ankle joints, MTPs, PIPs, and DIPs with good ROM and no synovitis. No warmth or effusion of knee joints. No tenderness or swelling of ankle joints.   CDAI Exam: CDAI Score: - Patient Global: -; Provider Global: - Swollen: -; Tender: - Joint Exam 07/13/2019   No joint exam has been documented for this visit   There is currently no information documented on the homunculus. Go to the Rheumatology activity and complete the homunculus joint exam.  Investigation: No additional findings.  Imaging: No results found.  Recent Labs: Lab Results  Component Value Date   WBC 5.9 07/13/2018   HGB 13.4 07/13/2018   PLT 255 07/13/2018   NA 142 07/13/2018   K 4.5 07/13/2018   CL 108 07/13/2018   CO2 28 07/13/2018   GLUCOSE 95 07/13/2018   BUN 23 07/13/2018   CREATININE 0.79 07/13/2018   BILITOT 0.2 07/13/2018   ALKPHOS 51 07/18/2013   AST 13 07/13/2018   ALT 12 07/13/2018   PROT 6.1 07/13/2018   ALBUMIN 3.8 07/18/2013   CALCIUM 9.7 07/13/2018   GFRAA 92  07/13/2018    Speciality Comments: No specialty comments available.  Procedures:  No procedures performed Allergies: Patient has no known allergies.   Assessment / Plan:     Visit Diagnoses: Osteopenia of multiple sites - Oct 01, 2017 T score of -2.4 with significant decrease in bone mass on comparison to previous bone density. She is tolerating Actonel 150 mg 1 tablet by mouth every 30 days well. She also takes vitamin D and calcium supplementation daily. She has been experiencing increased lower back pain for two and a half weeks. She has had difficulty performing daily activities, including exercising and stretching. She denies any falls or fractures at this time. She is currently scheduled to for an appointment with Orthopedics in one week to evaluate her back pain further. She is to continue on Actonel as prescribed, as well as vitamin D and calcium supplements. She is due for an updated DEXA bone scan in May 2021. We will schedule this for her.   Medication monitoring encounter - Use of gabapentin, Topamax and Prilosec.   Primary osteoarthritis of both hands-she has DIP and PIP thickening but no synovitis.  Joint protection was discussed.  Primary osteoarthritis of both knees -she has seen Dr. Noemi Chapel in the past.  Her knee joints are not hurting currently.  DDD (degenerative disc disease), lumbar-she has been having increased lower back pain.  She states she was diagnosed with a spinal stenosis in the past and has an appointment coming up with orthopedic surgeon.  Vestibular migraine-managed with medications.  She is aware that gabapentin and Topamax increases the risk of osteopenia.  History of breast cancer  Mixed hyperlipidemia  History of gastroesophageal reflux (GERD)  Status post gastric bypass for obesity  Orders: No orders of the defined types were placed in this encounter.  No orders of the defined types were placed in this encounter.    Follow-Up Instructions:  Return for Osteoarthritis, Psteopenia.   Bo Merino, MD  Note - This record has been created using Editor, commissioning.  Chart creation errors have been sought, but may not always  have been located. Such creation errors do not reflect on  the standard of medical care.

## 2019-07-13 ENCOUNTER — Other Ambulatory Visit: Payer: Self-pay

## 2019-07-13 ENCOUNTER — Ambulatory Visit: Payer: BC Managed Care – PPO | Admitting: Rheumatology

## 2019-07-13 ENCOUNTER — Encounter: Payer: Self-pay | Admitting: Rheumatology

## 2019-07-13 VITALS — BP 126/72 | HR 72 | Resp 12 | Ht 61.0 in | Wt 150.6 lb

## 2019-07-13 DIAGNOSIS — Z5181 Encounter for therapeutic drug level monitoring: Secondary | ICD-10-CM

## 2019-07-13 DIAGNOSIS — G43809 Other migraine, not intractable, without status migrainosus: Secondary | ICD-10-CM

## 2019-07-13 DIAGNOSIS — Z8719 Personal history of other diseases of the digestive system: Secondary | ICD-10-CM

## 2019-07-13 DIAGNOSIS — M170 Bilateral primary osteoarthritis of knee: Secondary | ICD-10-CM

## 2019-07-13 DIAGNOSIS — M19041 Primary osteoarthritis, right hand: Secondary | ICD-10-CM

## 2019-07-13 DIAGNOSIS — M51369 Other intervertebral disc degeneration, lumbar region without mention of lumbar back pain or lower extremity pain: Secondary | ICD-10-CM

## 2019-07-13 DIAGNOSIS — M19042 Primary osteoarthritis, left hand: Secondary | ICD-10-CM

## 2019-07-13 DIAGNOSIS — E782 Mixed hyperlipidemia: Secondary | ICD-10-CM

## 2019-07-13 DIAGNOSIS — M8589 Other specified disorders of bone density and structure, multiple sites: Secondary | ICD-10-CM

## 2019-07-13 DIAGNOSIS — Z853 Personal history of malignant neoplasm of breast: Secondary | ICD-10-CM

## 2019-07-13 DIAGNOSIS — M5136 Other intervertebral disc degeneration, lumbar region: Secondary | ICD-10-CM

## 2019-07-13 DIAGNOSIS — Z9884 Bariatric surgery status: Secondary | ICD-10-CM

## 2019-07-14 LAB — COMPLETE METABOLIC PANEL WITH GFR
AG Ratio: 1.9 (calc) (ref 1.0–2.5)
ALT: 20 U/L (ref 6–29)
AST: 18 U/L (ref 10–35)
Albumin: 4.1 g/dL (ref 3.6–5.1)
Alkaline phosphatase (APISO): 49 U/L (ref 37–153)
BUN: 21 mg/dL (ref 7–25)
CO2: 29 mmol/L (ref 20–32)
Calcium: 9.4 mg/dL (ref 8.6–10.4)
Chloride: 108 mmol/L (ref 98–110)
Creat: 0.73 mg/dL (ref 0.50–0.99)
GFR, Est African American: 101 mL/min/{1.73_m2} (ref >=60)
GFR, Est Non African American: 87 mL/min/{1.73_m2} (ref >=60)
Globulin: 2.2 g/dL (calc) (ref 1.9–3.7)
Glucose, Bld: 90 mg/dL (ref 65–99)
Potassium: 4.2 mmol/L (ref 3.5–5.3)
Sodium: 142 mmol/L (ref 135–146)
Total Bilirubin: 0.3 mg/dL (ref 0.2–1.2)
Total Protein: 6.3 g/dL (ref 6.1–8.1)

## 2019-07-14 LAB — CBC WITH DIFFERENTIAL/PLATELET
Absolute Monocytes: 450 cells/uL (ref 200–950)
Basophils Absolute: 40 cells/uL (ref 0–200)
Basophils Relative: 0.7 %
Eosinophils Absolute: 51 cells/uL (ref 15–500)
Eosinophils Relative: 0.9 %
HCT: 41.3 % (ref 35.0–45.0)
Hemoglobin: 13.9 g/dL (ref 11.7–15.5)
Lymphs Abs: 1664 cells/uL (ref 850–3900)
MCH: 31 pg (ref 27.0–33.0)
MCHC: 33.7 g/dL (ref 32.0–36.0)
MCV: 92 fL (ref 80.0–100.0)
MPV: 10.9 fL (ref 7.5–12.5)
Monocytes Relative: 7.9 %
Neutro Abs: 3494 cells/uL (ref 1500–7800)
Neutrophils Relative %: 61.3 %
Platelets: 257 10*3/uL (ref 140–400)
RBC: 4.49 10*6/uL (ref 3.80–5.10)
RDW: 11.7 % (ref 11.0–15.0)
Total Lymphocyte: 29.2 %
WBC: 5.7 10*3/uL (ref 3.8–10.8)

## 2019-08-02 DIAGNOSIS — M6281 Muscle weakness (generalized): Secondary | ICD-10-CM | POA: Insufficient documentation

## 2019-08-02 DIAGNOSIS — R030 Elevated blood-pressure reading, without diagnosis of hypertension: Secondary | ICD-10-CM | POA: Insufficient documentation

## 2019-09-20 ENCOUNTER — Other Ambulatory Visit: Payer: Self-pay | Admitting: *Deleted

## 2019-09-20 MED ORDER — RISEDRONATE SODIUM 150 MG PO TABS: ORAL_TABLET | ORAL | 0 refills | Status: DC

## 2019-09-20 NOTE — Telephone Encounter (Signed)
Refill request received via fax  Last Visit: 07/13/2019 Next Visit: 01/10/2020 Labs: 07/09/2019 CBC and CMP WNL  Okay to refill per Dr. Estanislado Pandy

## 2019-12-28 NOTE — Progress Notes (Signed)
Office Visit Note  Patient: Anna Navarro             Date of Birth: [birth year]           MRN: [id number]             PCP: Maury Dus, MD Referring: Maury Dus, MD Visit Date: 01/10/2020 Occupation: @GUAROCC @  Subjective:  Discuss discontinuing actonel   History of Present Illness: Anna Navarro is a 64 y.o. female with history of osteopenia, osteoarthritis, and DDD.  She is no longer taking Actonel. She took actonel for about 1 year but discontinued due to experiencing arthralgias. She states the arthralgias have improved significantly since stopping actonel. She continues to take vitamin D 5000 units 1 capsule by mouth daily.  She denies any recent falls or fractures.   She is scheduled for a left knee replacement in December 2021.    Activities of Daily Living:  Patient reports morning stiffness for a few minutes.   Patient Denies nocturnal pain.  Difficulty dressing/grooming: Denies Difficulty climbing stairs: Reports Difficulty getting out of chair: Reports Difficulty using hands for taps, buttons, cutlery, and/or writing: Reports  Review of Systems  Constitutional: Negative for fatigue.  HENT: Negative for mouth sores, mouth dryness and nose dryness.   Eyes: Negative for pain, itching, visual disturbance and dryness.  Respiratory: Negative for cough, hemoptysis, shortness of breath and difficulty breathing.   Cardiovascular: Negative for chest pain, palpitations, hypertension and swelling in legs/feet.  Gastrointestinal: Negative for blood in stool, constipation and diarrhea.  Endocrine: Negative for increased urination.  Genitourinary: Negative for difficulty urinating and painful urination.  Musculoskeletal: Positive for arthralgias, joint pain and morning stiffness. Negative for joint swelling, myalgias, muscle weakness, muscle tenderness and myalgias.  Skin: Negative for color change, pallor, rash, hair loss, nodules/bumps, redness, skin  tightness, ulcers and sensitivity to sunlight.  Allergic/Immunologic: Negative for susceptible to infections.  Neurological: Negative for dizziness, numbness, headaches, memory loss and weakness.  Hematological: Negative for bruising/bleeding tendency and swollen glands.  Psychiatric/Behavioral: Negative for depressed mood, confusion and sleep disturbance. The patient is not nervous/anxious.     PMFS History:  Patient Active Problem List   Diagnosis Date Noted  . Osteopenia of multiple sites 06/29/2018  . Primary osteoarthritis of both hands 06/29/2018  . Primary osteoarthritis of both knees 06/29/2018  . DDD (degenerative disc disease), lumbar 06/29/2018  . Status post gastric bypass for obesity 06/29/2018  . Genetic testing 01/28/2015  . Rhinitis, allergic 11/02/2014  . Hyperlipemia 11/02/2014  . 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 History of breast cancer 08/18/2012  . GERD 11/12/2006    Past Medical History:  Diagnosis Date  . Allergic rhinitis 02/03/2007  . Breast cancer ([place]) 2009   IDC of UOQ of right breast; ER/PR+, Her2-, Ki67 10%; s/p lumpectomy and radiation 2009  . Chronic eustachian tube dysfunction 05/23/2010  . DDD (degenerative disc disease), lumbar    seeing Dr. Ellene Route  . Depression 02/03/2007  . GERD 11/12/2006  . H/O iron deficiency anemia 05/09/2007   per pt from grastic bypass  . Hyperlipemia 11/12/2006  . Low back pain 02/03/2007  . Neck mass 01/03/2007  . Osteoporosis   . Personal history of radiation therapy 2009  . UTI (lower urinary tract infection) 11/12/2006  . Vestibular neuritis   . Warts 01/07/2010   hands    Family History  Problem Relation Age of Onset  . Heart attack Father   . Other Mother  bilateral mastectomies at 45y due to precancerous tumors  . Breast cancer Mother   . Healthy Mother   . Mental illness Sister   . Healthy Son   . Healthy Son   . Healthy Daughter   . Breast cancer Maternal Aunt        dx. early 85s  . Heart  Problems Maternal Aunt   . Lung cancer Maternal Uncle        dx. 62-62; former smoker  . Lung cancer Maternal Grandmother        dx. 36s; not a smoker; "metastasis to colon"  . Brain cancer Other        dx. >50s  . Cancer Other        dx. <50s; unknown cancer/dx. 33s; unknown type   Past Surgical History:  Procedure Laterality Date  . BREAST BIOPSY Right 2009   High Risk Stereo Biopsy   . BREAST LUMPECTOMY Right 2009  . BREAST LUMPECTOMY WITH AXILLARY LYMPH NODE BIOPSY Right 08/2007   RT, ER/PR+, T1N0  . 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 KNEE SURGERY Left   . 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 PFIZER SARS-COV-2 Vaccination 07/14/2019, 08/04/2019  . Td 05/18/1993     Objective: Vital Signs: BP 124/73 (BP Location: Left Arm, Patient Position: Sitting, Cuff Size: Normal)   Pulse 73   Resp 14   Ht 5' 0.5" (1.537 m)   Wt 153 lb 12.8 oz (69.8 kg)   BMI 29.54 kg/m    Physical Exam Vitals and nursing note reviewed.  Constitutional:      Appearance: She is well-developed.  HENT:     Head: Normocephalic and atraumatic.  Eyes:     Conjunctiva/sclera: Conjunctivae normal.  Pulmonary:     Effort: Pulmonary effort is normal.  Abdominal:     Palpations: Abdomen is soft.  Musculoskeletal:     Cervical back: Normal range of motion.  Skin:    General: Skin is warm and dry.     Capillary Refill: Capillary refill takes less than 2 seconds.  Neurological:     Mental Status: She is alert and oriented to person, place, and time.  Psychiatric:        Behavior: Behavior normal.      Musculoskeletal Exam: C-spine, thoracic spine, and lumbar spine good  ROM.  Shoulder joints, elbow joints, wrist joints, MCPs, PIPs, and DIPs good ROM with no synovitis.  PIP and DIP thickening consistent with osteoarthritis of both hands.  Hip joints good ROM with no discomfort.  Limited extension of the left knee joint.  Right knee has good ROM with no warmth or effusion.  Ankle joints good ROM with no tenderness or inflammation.  No tenderness of MTP joints.  PIP and DIP thickening consistent with osteoarthritis of both feet.   CDAI Exam: CDAI Score: - Patient Global: -; Provider Global: - Swollen: -; Tender: - Joint Exam 01/10/2020   No joint exam has been documented for this visit   There is currently no information documented on the homunculus. Go to the Rheumatology activity and complete the homunculus joint  exam.  Investigation: No additional findings.  Imaging: MM 3D SCREEN BREAST BILATERAL  Result Date: 01/09/2020 CLINICAL DATA:  Screening. EXAM: DIGITAL SCREENING BILATERAL MAMMOGRAM WITH TOMO AND CAD COMPARISON:  Previous exam(s). ACR Breast Density Category b: There are scattered areas of fibroglandular density. FINDINGS: There are no findings suspicious for malignancy. Images were processed with CAD. IMPRESSION: No mammographic evidence of malignancy. A result letter of this screening mammogram will be mailed directly to the patient. RECOMMENDATION: Screening mammogram in one year. (Code:SM-B-01Y) BI-RADS CATEGORY  1: Negative. Electronically Signed   By: Valentino Saxon MD   On: 01/09/2020 13:40    Recent Labs: Lab Results  Component Value Date   WBC 5.7 07/13/2019   HGB 13.9 07/13/2019   PLT 257 07/13/2019   NA 142 07/13/2019   K 4.2 07/13/2019   CL 108 07/13/2019   CO2 29 07/13/2019   GLUCOSE 90 07/13/2019   BUN 21 07/13/2019   CREATININE 0.73 07/13/2019   BILITOT 0.3 07/13/2019   ALKPHOS 51 07/18/2013   AST 18 07/13/2019   ALT 20 07/13/2019   PROT 6.3 07/13/2019   ALBUMIN 3.8 07/18/2013   CALCIUM 9.4 07/13/2019   GFRAA 101  07/13/2019    Speciality Comments: No specialty comments available.  Procedures:  No procedures performed Allergies: Patient has no known allergies.    Assessment / Plan:     Visit Diagnoses: Osteopenia of multiple sites - Oct 01, 2017 T score of -2.4 with significant decrease in bone mass on comparison to previous bone density. She is due to update her DEXA (due May 2021).  She was previously taking Actonel 150 mg monthly (for about 1 year), but she discontinued about 3-4 months due to experiencing severe arthralgias.  Her arthralgias have improved since discontinuing Actonel.  She has continued taking vitamin D 5,000 units daily.  She has not had any recent falls or fractures.  We discussed the importance of resistive exercises.  She is planning on having her left knee replaced in December 2021, and she hopes to be able to be more active after surgery. She would like to hold off on starting a new medication until after she has updated her DEXA and has had her left knee replaced.  We discussed different treatment options. She will not be a good candidate for bisphonates due to the severity of her arthralgias.  We discussed starting her on Prolia 60 mg sq injections every 6 months.  Indications, contraindications, and potential side effects of Prolia were discussed today. She was advised to notify us once she has updated her DEXA.   She will follow up in 6 months.   Medication monitoring encounter -  CBC and CMP WNL on 07/13/19. Her vitamin D was 53 on 06/15/18.  PTH WNL and SPEP WNL on 06/15/18. She continues to take vitamin D 5,000 units daily.    Primary osteoarthritis of both hands: She has PIP and DIP thickening consistent with osteoarthritis of both hands.  No tenderness or inflammation was noted.  She has complete fist formation bilaterally.  Primary osteoarthritis of both knees - She has been experiencing severe pain in her left knee joint.  She is scheduled for a knee replacement on  04/17/2020 by Dr. Noemi Chapel.  She has limited extension of the left knee joint on exam.  Right knee has good range of motion with no warmth or effusion.  She plans on trying to increase her activity level after she has her left knee joint replaced.  DDD (degenerative disc disease), lumbar: She is not experiencing any discomfort at this time.  No symptoms of radiculopathy.   Other medical conditions are listed as follows:   Vestibular migraine  Mixed hyperlipidemia  History of breast cancer  History of gastroesophageal reflux (GERD)  Status post gastric bypass for obesity  Orders: No orders of the defined types were placed in this encounter.  No orders of the defined types were placed in this encounter.     Follow-Up Instructions: Return in about 6 months (around 07/12/2020) for Osteoarthritis, DDD.   Bo Merino, MD   Scribed by-  Hazel Sams, PA-C  Note - This record has been created using Dragon software.  Chart creation errors have been sought, but may not always  have been located. Such creation errors do not reflect on  the standard of medical care.

## 2020-01-01 ENCOUNTER — Other Ambulatory Visit: Payer: Self-pay | Admitting: Obstetrics and Gynecology

## 2020-01-01 DIAGNOSIS — Z1231 Encounter for screening mammogram for malignant neoplasm of breast: Secondary | ICD-10-CM

## 2020-01-08 ENCOUNTER — Other Ambulatory Visit: Payer: Self-pay

## 2020-01-08 ENCOUNTER — Ambulatory Visit
Admission: RE | Admit: 2020-01-08 | Discharge: 2020-01-08 | Disposition: A | Discharge status: Home / Self Care | Payer: BC Managed Care – PPO | Source: Ambulatory Visit | Attending: Obstetrics and Gynecology | Admitting: Obstetrics and Gynecology

## 2020-01-08 DIAGNOSIS — Z1231 Encounter for screening mammogram for malignant neoplasm of breast: Secondary | ICD-10-CM

## 2020-01-10 ENCOUNTER — Ambulatory Visit: Payer: BC Managed Care – PPO | Admitting: Rheumatology

## 2020-01-10 ENCOUNTER — Other Ambulatory Visit: Payer: Self-pay

## 2020-01-10 ENCOUNTER — Encounter: Payer: Self-pay | Admitting: Rheumatology

## 2020-01-10 VITALS — BP 124/73 | HR 73 | Resp 14 | Ht 60.5 in | Wt 153.8 lb

## 2020-01-10 DIAGNOSIS — Z9884 Bariatric surgery status: Secondary | ICD-10-CM

## 2020-01-10 DIAGNOSIS — M8589 Other specified disorders of bone density and structure, multiple sites: Secondary | ICD-10-CM

## 2020-01-10 DIAGNOSIS — M170 Bilateral primary osteoarthritis of knee: Secondary | ICD-10-CM

## 2020-01-10 DIAGNOSIS — M19042 Primary osteoarthritis, left hand: Secondary | ICD-10-CM

## 2020-01-10 DIAGNOSIS — M5136 Other intervertebral disc degeneration, lumbar region: Secondary | ICD-10-CM

## 2020-01-10 DIAGNOSIS — E782 Mixed hyperlipidemia: Secondary | ICD-10-CM

## 2020-01-10 DIAGNOSIS — M51369 Other intervertebral disc degeneration, lumbar region without mention of lumbar back pain or lower extremity pain: Secondary | ICD-10-CM

## 2020-01-10 DIAGNOSIS — Z853 Personal history of malignant neoplasm of breast: Secondary | ICD-10-CM

## 2020-01-10 DIAGNOSIS — Z8719 Personal history of other diseases of the digestive system: Secondary | ICD-10-CM

## 2020-01-10 DIAGNOSIS — M19041 Primary osteoarthritis, right hand: Secondary | ICD-10-CM | POA: Diagnosis not present

## 2020-01-10 DIAGNOSIS — Z5181 Encounter for therapeutic drug level monitoring: Secondary | ICD-10-CM

## 2020-01-10 DIAGNOSIS — G43809 Other migraine, not intractable, without status migrainosus: Secondary | ICD-10-CM

## 2020-01-10 NOTE — Patient Instructions (Signed)
Denosumab injection What is this medicine? DENOSUMAB (den oh sue mab) slows bone breakdown. Prolia is used to treat osteoporosis in women after menopause and in men, and in people who are taking corticosteroids for 6 months or more. Xgeva is used to treat a high calcium level due to cancer and to prevent bone fractures and other bone problems caused by multiple myeloma or cancer bone metastases. Xgeva is also used to treat giant cell tumor of the bone. This medicine may be used for other purposes; ask your health care provider or pharmacist if you have questions. COMMON BRAND NAME(S): Prolia, XGEVA What should I tell my health care provider before I take this medicine? They need to know if you have any of these conditions:  dental disease  having surgery or tooth extraction  infection  kidney disease  low levels of calcium or Vitamin D in the blood  malnutrition  on hemodialysis  skin conditions or sensitivity  thyroid or parathyroid disease  an unusual reaction to denosumab, other medicines, foods, dyes, or preservatives  pregnant or trying to get pregnant  breast-feeding How should I use this medicine? This medicine is for injection under the skin. It is given by a health care professional in a hospital or clinic setting. A special MedGuide will be given to you before each treatment. Be sure to read this information carefully each time. For Prolia, talk to your pediatrician regarding the use of this medicine in children. Special care may be needed. For Xgeva, talk to your pediatrician regarding the use of this medicine in children. While this drug may be prescribed for children as young as 13 years for selected conditions, precautions do apply. Overdosage: If you think you have taken too much of this medicine contact a poison control center or emergency room at once. NOTE: This medicine is only for you. Do not share this medicine with others. What if I miss a dose? It is  important not to miss your dose. Call your doctor or health care professional if you are unable to keep an appointment. What may interact with this medicine? Do not take this medicine with any of the following medications:  other medicines containing denosumab This medicine may also interact with the following medications:  medicines that lower your chance of fighting infection  steroid medicines like prednisone or cortisone This list may not describe all possible interactions. Give your health care provider a list of all the medicines, herbs, non-prescription drugs, or dietary supplements you use. Also tell them if you smoke, drink alcohol, or use illegal drugs. Some items may interact with your medicine. What should I watch for while using this medicine? Visit your doctor or health care professional for regular checks on your progress. Your doctor or health care professional may order blood tests and other tests to see how you are doing. Call your doctor or health care professional for advice if you get a fever, chills or sore throat, or other symptoms of a cold or flu. Do not treat yourself. This drug may decrease your body's ability to fight infection. Try to avoid being around people who are sick. You should make sure you get enough calcium and vitamin D while you are taking this medicine, unless your doctor tells you not to. Discuss the foods you eat and the vitamins you take with your health care professional. See your dentist regularly. Brush and floss your teeth as directed. Before you have any dental work done, tell your dentist you are   receiving this medicine. Do not become pregnant while taking this medicine or for 5 months after stopping it. Talk with your doctor or health care professional about your birth control options while taking this medicine. Women should inform their doctor if they wish to become pregnant or think they might be pregnant. There is a potential for serious side  effects to an unborn child. Talk to your health care professional or pharmacist for more information. What side effects may I notice from receiving this medicine? Side effects that you should report to your doctor or health care professional as soon as possible:  allergic reactions like skin rash, itching or hives, swelling of the face, lips, or tongue  bone pain  breathing problems  dizziness  jaw pain, especially after dental work  redness, blistering, peeling of the skin  signs and symptoms of infection like fever or chills; cough; sore throat; pain or trouble passing urine  signs of low calcium like fast heartbeat, muscle cramps or muscle pain; pain, tingling, numbness in the hands or feet; seizures  unusual bleeding or bruising  unusually weak or tired Side effects that usually do not require medical attention (report to your doctor or health care professional if they continue or are bothersome):  constipation  diarrhea  headache  joint pain  loss of appetite  muscle pain  runny nose  tiredness  upset stomach This list may not describe all possible side effects. Call your doctor for medical advice about side effects. You may report side effects to FDA at 1-800-FDA-1088. Where should I keep my medicine? This medicine is only given in a clinic, doctor's office, or other health care setting and will not be stored at home. NOTE: This sheet is a summary. It may not cover all possible information. If you have questions about this medicine, talk to your doctor, pharmacist, or health care provider.  2020 Elsevier/Gold Standard (2017-09-10 16:10:44)

## 2020-01-11 ENCOUNTER — Encounter: Payer: Self-pay | Admitting: Obstetrics and Gynecology

## 2020-01-11 ENCOUNTER — Telehealth: Payer: Self-pay

## 2020-01-11 DIAGNOSIS — M8580 Other specified disorders of bone density and structure, unspecified site: Secondary | ICD-10-CM

## 2020-01-11 NOTE — Telephone Encounter (Signed)
Left message for pt to return call to triage RN. 

## 2020-01-11 NOTE — Telephone Encounter (Signed)
Pt sent following mychart message:   Mariell, Nester, MD 6 hours ago (8:39 AM)   Good morning  I saw the Dr. Delphina Cahill my rheumatologist yesterday and she asked that you request that I get a bone density appointment  Thank you Samule Dry

## 2020-01-12 NOTE — Telephone Encounter (Signed)
Spoke with pt. Pt states needing BMD order placed by Dr Talbert Nan. Pt states Dr Estanislado Pandy advised this due to pt's osteopenia in multiple sites. Last BMD 09/2017.  Orders placed at [facility] for BMD order. Pt aware and will call today to schedule. Pt had last MMG 01/09/20- Birads 1, neg  Routing to Dr Talbert Nan for update and review.  Encounter closed.

## 2020-01-12 NOTE — Telephone Encounter (Signed)
Patient returned a call to Stephanie. 

## 2020-02-22 ENCOUNTER — Ambulatory Visit: Payer: BC Managed Care – PPO | Admitting: Neurology

## 2020-02-22 ENCOUNTER — Other Ambulatory Visit: Payer: Self-pay

## 2020-02-22 ENCOUNTER — Encounter: Payer: Self-pay | Admitting: Neurology

## 2020-02-22 VITALS — BP 130/80 | HR 70 | Ht 61.0 in | Wt 151.5 lb

## 2020-02-22 DIAGNOSIS — G43809 Other migraine, not intractable, without status migrainosus: Secondary | ICD-10-CM

## 2020-02-22 MED ORDER — TOPIRAMATE 100 MG PO TABS: 200.0000 mg | ORAL_TABLET | Freq: Every day | ORAL | 3 refills | Status: DC

## 2020-02-22 NOTE — Progress Notes (Signed)
 NEUROLOGY FOLLOW UP OFFICE NOTE  Anna Navarro [id number]  HISTORY OF PRESENT ILLNESS: Anna Navarro is a 64 year old right-handed woman with depression and history of breast cancer status post lumpectomy and radiation in 2009 who follows up for vestibular migraine.  UPDATE: When she is stressed, she may have episode of dizziness but only one severe episode over the past year.  Lasts a few hours. Rescue protocol: Diazepam 2.5mg (less if taken at work due to concerns of drowsiness) and sometimes meclizine Current NSAIDS:Naproxen 500mg Current analgesics:no Current triptans:no Current ergotamine:no Current anti-emetic:no Current muscle relaxants:no Current anti-anxiolytic:Diazepam 2.5mg (for acute attacks of vertigo) Current sleep aide:no Current Antihypertensive medications:no Current Antidepressant medications:Pristiq Current Anticonvulsant medications:topiramate 200mg at bedtime; gabapentin 300mg at bedtime Current anti-CGRP:no Current Vitamins/Herbal/Supplements:B12 Current Antihistamines/Decongestants:meclizine Other therapy:no Hormone/birth control:  Estradiol  Caffeine:Very little. No coffee Alcohol:2 glasses of wine per week Smoker:no Diet:A few glasses of water Exercise:yes Depression:no; Anxiety:no Other pain:left knee.  Needs TKR. Sleep hygiene:Good  12/28/2019 LABS:  CBC with WBC 5, HGB 14.2, HCT 41.6, PLT 231; CMP with Na 144, K 4.2, Cl 108, CO2 29, BUN 18, Cr 0.89, glucose 101.  HISTORY: She began having recurrent episodes of dizziness in March 2015.She describes it as a sensation of movement, like during a drop on a roller coaster, as well as spinning.It occurs spontaneously and at rest, but can be aggravated with movement.Sometimes it is so severe, she was unable to drive and needed her husband to come and pick her up.She may feel a little quesy, but she denies any significant nausea and no  vomiting.She endorses photophobia, phonophobia and osmophobia.She denies lightheadedness, vision loss, focal numbness or weakness, or slurred speech. It is associated with a dull, 6/10 non-throbbing holocephalic ache.There are no known triggers.Only rest and sleep help.Initially, at time of her first visit, t typically lasts 2-3 days and occurred once a week to once every other week.There are no specific relieving factors.She has taken meclizine, which is not too effective.Even though the spells are episodic, she didn't quite feel right in between spells.  She was evaluated by Dr. Kraus at the [facility].An ENG showed central findings.She had an MRI of the brain with and without contrast, which was reportedly normal.  She denies preceding viral illness or head trauma.She denies prior history of migraine.She denies family history of migraine.  PAST MEDICAL HISTORY: Past Medical History:  Diagnosis Date  . Allergic rhinitis 02/03/2007  . Breast cancer ([facility]) 2009   IDC of UOQ of right breast; ER/PR+, Her2-, Ki67 10%; s/p lumpectomy and radiation 2009  . Chronic eustachian tube dysfunction 05/23/2010  . DDD (degenerative disc disease), lumbar    seeing Dr. Elsner  . Depression 02/03/2007  . GERD 11/12/2006  . H/O iron deficiency anemia 05/09/2007   per pt from grastic bypass  . Hyperlipemia 11/12/2006  . Low back pain 02/03/2007  . Neck mass 01/03/2007  . Osteoporosis   . Personal history of radiation therapy 2009  . UTI (lower urinary tract infection) 11/12/2006  . Vestibular neuritis   . Warts 01/07/2010   hands    MEDICATIONS: Current Outpatient Medications on File Prior to Visit  Medication Sig Dispense Refill  . Cholecalciferol (VITAMIN D3) 5000 UNITS TABS Take 1 capsule by mouth daily. 30 tablet   . desvenlafaxine (PRISTIQ) 50 MG 24 hr tablet Take 1 tablet (50 mg total) by mouth daily. 90 tablet 3  . Estradiol 10 MCG TABS vaginal tablet Place 1  tablet (  10 mcg total) vaginally 2 (two) times a week. 24 tablet 3  . gabapentin (NEURONTIN) 100 MG capsule take 1 capsule at night (Patient taking differently: Take 300 mg by mouth daily. take 1 capsule at night) 90 capsule 3  . meclizine (ANTIVERT) 25 MG tablet Take 1 tablet (25 mg total) by mouth 3 (three) times daily as needed for dizziness. 30 tablet 0  . omeprazole (PRILOSEC) 20 MG capsule Take 20 mg by mouth daily.    Marland Kitchen topiramate (TOPAMAX) 100 MG tablet Take 2 tablets (200 mg total) by mouth at bedtime. 180 tablet 3  . vitamin B-12 (CYANOCOBALAMIN) 250 MCG tablet Take 250 mcg by mouth daily.     No current facility-administered medications on file prior to visit.    ALLERGIES: No Known Allergies  FAMILY HISTORY: Family History  Problem Relation Age of Onset  . Heart attack Father   . Other Mother        bilateral mastectomies at 45y due to precancerous tumors  . Breast cancer Mother   . Healthy Mother   . Mental illness Sister   . Healthy Son   . Healthy Son   . Healthy Daughter   . Breast cancer Maternal Aunt        dx. early 70s  . Heart Problems Maternal Aunt   . Lung cancer Maternal Uncle        dx. 62-62; former smoker  . Lung cancer Maternal Grandmother        dx. 63s; not a smoker; "metastasis to colon"  . Brain cancer Other        dx. >50s  . Cancer Other        dx. <50s; unknown cancer/dx. 68s; unknown type    SOCIAL HISTORY: Social History   Socioeconomic History  . Marital status: Married    Spouse name: Not on file  . Number of children: Not on file  . Years of education: Not on file  . Highest education level: Not on file  Occupational History  . Not on file  Tobacco Use  . Smoking status: Never Smoker  . Smokeless tobacco: Never Used  Vaping Use  . Vaping Use: Never used  Substance and Sexual Activity  . Alcohol use: Yes    Alcohol/week: 2.0 standard drinks    Types: 2 Glasses of wine per week    Comment: occ  . Drug use: No  . Sexual  activity: Yes    Partners: Male    Birth control/protection: Surgical  Other Topics Concern  . 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 Difficulty of Paying Living Expenses: Not on file  Food Insecurity:   . Worried About Charity fundraiser in the Last Year: Not on file  . Ran Out of Food in the Last Year: Not on file  Transportation Needs:   . Lack of Transportation (Medical): Not on file  . Lack of Transportation (Non-Medical): Not on file  Physical Activity:   . Days of Exercise per Week: Not on file  . Minutes of Exercise per Session: Not on file  Stress:   . Feeling of Stress : Not  on file  Social Connections:   . Frequency of Communication with Friends and Family: Not on file  . Frequency of Social Gatherings with Friends and Family: Not on file  . Attends Religious Services: Not on file  . Active Member of Clubs or Organizations: Not on file  . Attends Archivist Meetings: Not on file  . Marital Status: Not on file  Intimate Partner Violence:   . Fear of Current or Ex-Partner: Not on file  . Emotionally Abused: Not on file  . Physically Abused: Not on file  . Sexually Abused: Not on file    PHYSICAL EXAM: Blood pressure 130/80, pulse 70, height 5' 1" (1.549 m), weight 151 lb 8 oz (68.7 kg), SpO2 100 %. General: No acute distress.  Patient appears well-groomed.   Head:  Normocephalic/atraumatic Eyes:  Fundi examined but not visualized Neck: supple, no paraspinal tenderness, full range of motion Heart:  Regular rate and rhythm Lungs:  Clear to auscultation bilaterally Back: No paraspinal tenderness Neurological Exam: alert and oriented to person, place, and time. Attention span and  concentration intact, recent and remote memory intact, fund of knowledge intact.  Speech fluent and not dysarthric, language intact.  CN II-XII intact. Bulk and tone normal, muscle strength 5/5 throughout.  Sensation to light touch intact.  Deep tendon reflexes 2+ throughout (left patellar not checked).  Finger to nose and heel to shin testing intact.  Gait normal, Romberg negative.  IMPRESSION: Vestibular migraine  PLAN: 1.  Topiramate 276m at bedtime 2.  Diazepam and/or meclizine for abortive therapy for vertigo, naproxen for headache 3.  Limit use of pain relievers to no more than 2 days out of week to prevent risk of rebound or medication-overuse headache. 4.  Keep headache diary 5.  Follow up in one year  AMetta Clines DO  CC: RMaury Dus MD

## 2020-02-22 NOTE — Patient Instructions (Signed)
Topiramate Diazepam and meclizine

## 2020-03-21 ENCOUNTER — Ambulatory Visit: Payer: BC Managed Care – PPO | Admitting: Obstetrics and Gynecology

## 2020-03-28 ENCOUNTER — Ambulatory Visit
Admission: RE | Admit: 2020-03-28 | Discharge: 2020-03-28 | Disposition: A | Discharge status: Home / Self Care | Payer: BC Managed Care – PPO | Source: Ambulatory Visit | Attending: Obstetrics and Gynecology | Admitting: Obstetrics and Gynecology

## 2020-03-28 ENCOUNTER — Other Ambulatory Visit: Payer: Self-pay

## 2020-03-28 DIAGNOSIS — M8580 Other specified disorders of bone density and structure, unspecified site: Secondary | ICD-10-CM

## 2020-03-29 NOTE — Progress Notes (Signed)
DEXA reveals osteopenia.  There was no comparison study available.  I plan to repeat DEXA in 2 years.  Please advise total intake of calcium between dietary and supplement 1200 mg p.o. daily along with vitamin D.  Please advise resistive exercises.

## 2020-04-17 HISTORY — PX: TOTAL KNEE ARTHROPLASTY: SHX125

## 2020-06-26 NOTE — Progress Notes (Signed)
Office Visit Note  Patient: Anna Navarro             Date of Birth: 07-16-1955           MRN: [id number]             PCP: Maury Dus, MD Referring: Maury Dus, MD Visit Date: 07/10/2020 Occupation: @GUAROCC @  Subjective:  Left knee replacement stiffness   History of Present Illness: Anna Navarro is a 65 y.o. female with history of osteopenia, osteoarthritis, and DDD.  Patient reports that she had a left knee replacement performed by Dr. Noemi Chapel in December 2021.  She reports that she has been going to physical therapy as directed but continues to have pain and stiffness with flexion and extension of the left knee.  She has been trying to use the elliptical on a regular basis to strengthen and stretch her quad and hamstrings. She is scheduled for manipulation with Dr. Noemi Chapel on Monday.  She states that she has had increased discomfort on the lateral aspect of her left hip which she attributes to gait changes since having surgery.  She has taken 2-3 prednisone tapers since her surgery which has been helpful but she does not want to have to remain on prednisone in the future.  She denies any other joint pain or joint swelling at this time.  She has not had any recent falls or fractures.  She continues to take a calcium and vitamin D supplement on a daily basis.    Activities of Daily Living:  Patient reports morning stiffness for 0 minutes.   Patient Denies nocturnal pain.  Difficulty dressing/grooming: Denies Difficulty climbing stairs: Denies Difficulty getting out of chair: Denies Difficulty using hands for taps, buttons, cutlery, and/or writing: Denies  Review of Systems  Constitutional: Negative for fatigue.  HENT: Negative for mouth sores, mouth dryness and nose dryness.   Eyes: Negative for pain, itching and dryness.  Respiratory: Negative for shortness of breath and difficulty breathing.   Cardiovascular: Negative for chest pain and palpitations.   Gastrointestinal: Positive for constipation. Negative for blood in stool and diarrhea.  Endocrine: Negative for increased urination.  Genitourinary: Negative for difficulty urinating.  Musculoskeletal: Negative for arthralgias, joint pain, joint swelling, myalgias, morning stiffness, muscle tenderness and myalgias.  Skin: Negative for color change, rash and redness.  Allergic/Immunologic: Negative for susceptible to infections.  Neurological: Negative for dizziness, numbness, headaches, memory loss and weakness.  Hematological: Negative for bruising/bleeding tendency.  Psychiatric/Behavioral: Positive for sleep disturbance. Negative for confusion.    PMFS History:  Patient Active Problem List   Diagnosis Date Noted  . Osteopenia of multiple sites 06/29/2018  . Primary osteoarthritis of both hands 06/29/2018  . Primary osteoarthritis of both knees 06/29/2018  . DDD (degenerative disc disease), lumbar 06/29/2018  . Status post gastric bypass for obesity 06/29/2018  . Genetic testing 01/28/2015  . Rhinitis, allergic 11/02/2014  . Hyperlipemia 11/02/2014  . 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 History of breast cancer 08/18/2012  . GERD 11/12/2006    Past Medical History:  Diagnosis Date  . Allergic rhinitis 02/03/2007  . Breast cancer ([facility]) 2009   IDC of UOQ of right breast; ER/PR+, Her2-, Ki67 10%; s/p lumpectomy and radiation 2009  . Chronic eustachian tube dysfunction 05/23/2010  . DDD (degenerative disc disease), lumbar    seeing Dr. Ellene Route  . Depression 02/03/2007  . GERD 11/12/2006  . H/O iron deficiency anemia 05/09/2007   per pt from grastic bypass  .  Hyperlipemia 11/12/2006  . Low back pain 02/03/2007  . Neck mass 01/03/2007  . Osteoporosis   . Personal history of radiation therapy 2009  . UTI (lower urinary tract infection) 11/12/2006  . Vestibular neuritis   . Warts 01/07/2010   hands    Family History  Problem Relation Age of Onset  . Heart attack Father   . Other  Mother        bilateral mastectomies at 45y due to precancerous tumors  . Breast cancer Mother   . Healthy Mother   . Mental illness Sister   . Healthy Son   . Healthy Son   . Healthy Daughter   . Breast cancer Maternal Aunt        dx. early 46s  . Heart Problems Maternal Aunt   . Lung cancer Maternal Uncle        dx. 62-62; former smoker  . Lung cancer Maternal Grandmother        dx. 20s; not a smoker; "metastasis to colon"  . Brain cancer Other        dx. >50s  . Cancer Other        dx. <50s; unknown cancer/dx. 67s; unknown type   Past Surgical History:  Procedure Laterality Date  . BREAST BIOPSY Right 2009   High Risk Stereo Biopsy   . BREAST LUMPECTOMY Right 2009  . BREAST LUMPECTOMY WITH AXILLARY LYMPH NODE BIOPSY Right 08/2007   RT, ER/PR+, T1N0  . 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 KNEE SURGERY Left   . TOTAL ABDOMINAL HYSTERECTOMY    . 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 PFIZER(Purple Top)SARS-COV-2 Vaccination 07/14/2019, 08/04/2019, 03/01/2020  . Td 05/18/1993     Objective: Vital Signs: BP 128/72 (BP Location: Left Arm, Patient Position: Sitting, Cuff Size: Normal)   Pulse 62   Resp 15   Ht 5' 1.5" (1.562 m)   Wt 153 lb 9.6 oz (69.7 kg)   BMI 28.55 kg/m    Physical Exam Vitals and nursing note reviewed.  Constitutional:      Appearance: She is well-developed and well-nourished.  HENT:     Head: Normocephalic and atraumatic.  Eyes:     Extraocular Movements: EOM normal.     Conjunctiva/sclera: Conjunctivae normal.  Cardiovascular:     Pulses: Intact distal pulses.  Pulmonary:     Effort: Pulmonary effort is  normal.  Abdominal:     Palpations: Abdomen is soft.  Musculoskeletal:     Cervical back: Normal range of motion.  Skin:    General: Skin is warm and dry.     Capillary Refill: Capillary refill takes less than 2 seconds.  Neurological:     Mental Status: She is alert and oriented to person, place, and time.  Psychiatric:        Mood and Affect: Mood and affect normal.        Behavior: Behavior normal.     Musculoskeletal Exam: C-spine, thoracic spine, lumbar spine have good range of motion with no discomfort.  No midline spinal tenderness.  No SI joint tenderness.  Shoulder joints, elbow  joints, wrist joints, MCPs, PIPs, DIPs have good range of motion with no synovitis.  She is able to make a complete fist bilaterally.  Mild PIP and DIP thickening consistent with osteoarthritis of both hands noted.  Hip joints have good range of motion with no discomfort.  Tenderness over the left trochanteric bursa.  Left knee replacement has limited flexion and extension with warmth but no effusion.  Right knee has full range of motion with no warmth or effusion.  Ankle joints have good range of motion with no tenderness or inflammation.  CDAI Exam: CDAI Score: -- Patient Global: --; Provider Global: -- Swollen: --; Tender: -- Joint Exam 07/10/2020   No joint exam has been documented for this visit   There is currently no information documented on the homunculus. Go to the Rheumatology activity and complete the homunculus joint exam.  Investigation: No additional findings.  Imaging: No results found.  Recent Labs: Lab Results  Component Value Date   WBC 5.7 07/13/2019   HGB 13.9 07/13/2019   PLT 257 07/13/2019   NA 142 07/13/2019   K 4.2 07/13/2019   CL 108 07/13/2019   CO2 29 07/13/2019   GLUCOSE 90 07/13/2019   BUN 21 07/13/2019   CREATININE 0.73 07/13/2019   BILITOT 0.3 07/13/2019   ALKPHOS 51 07/18/2013   AST 18 07/13/2019   ALT 20 07/13/2019   PROT 6.3 07/13/2019   ALBUMIN  3.8 07/18/2013   CALCIUM 9.4 07/13/2019   GFRAA 101 07/13/2019    Speciality Comments: No specialty comments available.  Procedures:  No procedures performed Allergies: Patient has no known allergies.   Assessment / Plan:     Visit Diagnoses: Osteopenia of multiple sites - 03/28/20 DEXA:  The BMD measured at Femur Total Left is 0.734 g/cm2 with a T-score of -2.2. Previous DEXA on Oct 01, 2017 T score of -2.4 with significant decrease in bone mass: Discussed DEXA results from 03/28/2020 in detail and all questions were addressed.  She has not had any recent falls or fractures.  She has no midline spinal tenderness.  She has been taking vitamin D 5000 units daily and trying to take calcium 600 mg twice daily.  We discussed natural dietary sources of calcium which she can start taking.  We also discussed the importance of resistive exercises.  She has been using the elliptical for exercise on a regular basis recently.  She will be due to repeat DEXA in November 2023. She will follow up in the office in 6 months.   Medication monitoring encounter - Previously taking Actonel 150 mg monthly (for about 1 year), but she discontinued due to experiencing severe arthralgias.  She is not taking any medications for management of osteopenia at this time.  Primary osteoarthritis of both hands: She has mild PIP and DIP thickening consistent with osteoarthritis of both hands.  No joint tenderness or inflammation was noted.  She is able to make a complete fist bilaterally.  Joint protection and muscle strengthening were discussed.  Primary osteoarthritis of right knee: Doing well.  She has good range of motion with no discomfort at this time.  No warmth or effusion was noted.  History of total knee arthroplasty, left - Performed by Dr. Noemi Chapel 12/21.  She has been going to physical therapy as directed.  She has limited flexion extension of the left knee replacement.  She has been experiencing increased discomfort  in the lateral aspect of her left hip which she attributes to gait changes  since surgery.  She is scheduled for manipulation on 07/15/20 with Dr. Noemi Chapel.  She plans on continuing to use to elliptical for exercise to stretch and strengthen her quad and hamstrings.   DDD (degenerative disc disease), lumbar: She is not experiencing any discomfort in her lower back at this time.  No midline spinal tenderness.  No SI joint tenderness.  Other medical conditions are listed as follows:  Vestibular migraine  Mixed hyperlipidemia  History of breast cancer  History of gastroesophageal reflux (GERD)  Status post gastric bypass for obesity  Orders: No orders of the defined types were placed in this encounter.  No orders of the defined types were placed in this encounter.    Follow-Up Instructions: Return in about 6 months (around 01/07/2021) for Osteoarthritis, DDD.   Ofilia Neas, PA-C  Note - This record has been created using Dragon software.  Chart creation errors have been sought, but may not always  have been located. Such creation errors do not reflect on  the standard of medical care.

## 2020-07-10 ENCOUNTER — Ambulatory Visit: Payer: BC Managed Care – PPO | Admitting: Physician Assistant

## 2020-07-10 ENCOUNTER — Encounter: Payer: Self-pay | Admitting: Physician Assistant

## 2020-07-10 ENCOUNTER — Other Ambulatory Visit: Payer: Self-pay

## 2020-07-10 VITALS — BP 128/72 | HR 62 | Resp 15 | Ht 61.5 in | Wt 153.6 lb

## 2020-07-10 DIAGNOSIS — E782 Mixed hyperlipidemia: Secondary | ICD-10-CM

## 2020-07-10 DIAGNOSIS — M170 Bilateral primary osteoarthritis of knee: Secondary | ICD-10-CM

## 2020-07-10 DIAGNOSIS — Z5181 Encounter for therapeutic drug level monitoring: Secondary | ICD-10-CM

## 2020-07-10 DIAGNOSIS — M8589 Other specified disorders of bone density and structure, multiple sites: Secondary | ICD-10-CM | POA: Diagnosis not present

## 2020-07-10 DIAGNOSIS — Z9884 Bariatric surgery status: Secondary | ICD-10-CM

## 2020-07-10 DIAGNOSIS — M19041 Primary osteoarthritis, right hand: Secondary | ICD-10-CM | POA: Diagnosis not present

## 2020-07-10 DIAGNOSIS — G43809 Other migraine, not intractable, without status migrainosus: Secondary | ICD-10-CM

## 2020-07-10 DIAGNOSIS — M5136 Other intervertebral disc degeneration, lumbar region: Secondary | ICD-10-CM

## 2020-07-10 DIAGNOSIS — M19042 Primary osteoarthritis, left hand: Secondary | ICD-10-CM

## 2020-07-10 DIAGNOSIS — M1711 Unilateral primary osteoarthritis, right knee: Secondary | ICD-10-CM | POA: Diagnosis not present

## 2020-07-10 DIAGNOSIS — Z96652 Presence of left artificial knee joint: Secondary | ICD-10-CM

## 2020-07-10 DIAGNOSIS — Z8719 Personal history of other diseases of the digestive system: Secondary | ICD-10-CM

## 2020-07-10 DIAGNOSIS — Z853 Personal history of malignant neoplasm of breast: Secondary | ICD-10-CM

## 2020-09-02 ENCOUNTER — Other Ambulatory Visit: Payer: Self-pay

## 2020-09-02 ENCOUNTER — Encounter: Payer: Self-pay | Admitting: Obstetrics and Gynecology

## 2020-09-02 ENCOUNTER — Ambulatory Visit: Payer: BC Managed Care – PPO | Admitting: Obstetrics and Gynecology

## 2020-09-02 VITALS — BP 122/68 | HR 75 | Ht 61.5 in | Wt 149.0 lb

## 2020-09-02 DIAGNOSIS — Z01419 Encounter for gynecological examination (general) (routine) without abnormal findings: Secondary | ICD-10-CM | POA: Diagnosis not present

## 2020-09-02 DIAGNOSIS — R6882 Decreased libido: Secondary | ICD-10-CM | POA: Diagnosis not present

## 2020-09-02 DIAGNOSIS — R400 Somnolence: Secondary | ICD-10-CM | POA: Insufficient documentation

## 2020-09-02 DIAGNOSIS — G479 Sleep disorder, unspecified: Secondary | ICD-10-CM | POA: Insufficient documentation

## 2020-09-02 DIAGNOSIS — G4733 Obstructive sleep apnea (adult) (pediatric): Secondary | ICD-10-CM | POA: Insufficient documentation

## 2020-09-02 DIAGNOSIS — F324 Major depressive disorder, single episode, in partial remission: Secondary | ICD-10-CM | POA: Insufficient documentation

## 2020-09-02 DIAGNOSIS — G629 Polyneuropathy, unspecified: Secondary | ICD-10-CM | POA: Insufficient documentation

## 2020-09-02 DIAGNOSIS — E559 Vitamin D deficiency, unspecified: Secondary | ICD-10-CM | POA: Insufficient documentation

## 2020-09-02 NOTE — Patient Instructions (Signed)

## 2020-09-02 NOTE — Progress Notes (Signed)
65 y.o. [id number] Married Declined Declined female here for annual exam.    Patient states that she forgets to use her estradiol vaginal tablet. She hasn't used them in a long time. Infrequently sexually active, helped with lubrication. Will not refill her estrogen.  She c/o absent libido, doesn't enjoy, not having orgasm.     H/O right breast cancer in 2009  H/O hysterectomy  H/O osteopenia with elevated risk of fracture. She hasn't tolerated biphosphonates or Evista.   She had a total knee replacement on the left in 12/21.     No LMP recorded. Patient has had a hysterectomy.          Sexually active: Yes.    The current method of family planning is status post hysterectomy.    Exercising: Yes.    Walking and gym  Smoker:  no  Health Maintenance: Pap:  2006  History of abnormal Pap:  no MMG:  01/09/20 density B Bi-rads 1 Neg  BMD:  11/11/21osteopenic T-score -2.2, FRAX not calculated secondary to prior use of Evista. Dr Estanislado Pandy is following.  Colonoscopy:12/19 follow up in 3 years, polyps.   TDaP:  02/2019 Gardasil:  NA   reports that she has never smoked. She has never used smokeless tobacco. She reports current alcohol use of about 2.0 standard drinks of alcohol per week. She reports that she does not use drugs. Her children are local. No grandchildren. 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   Past Medical History:  Diagnosis Date  . Allergic rhinitis 02/03/2007  . Breast cancer ([place]) 2009   IDC of UOQ of right breast; ER/PR+, Her2-, Ki67 10%; s/p lumpectomy and radiation 2009  . Chronic eustachian tube dysfunction 05/23/2010  . DDD (degenerative disc disease), lumbar    seeing Dr. Ellene Route  . Depression 02/03/2007  . GERD 11/12/2006  . H/O iron deficiency anemia 05/09/2007   per pt from grastic bypass  . Hyperlipemia 11/12/2006  . Low back pain 02/03/2007  . Neck mass 01/03/2007  . Osteoporosis   . Personal history of radiation therapy 2009  . UTI (lower urinary tract  infection) 11/12/2006  . Vestibular neuritis   . Warts 01/07/2010   hands    Past Surgical History:  Procedure Laterality Date  . BREAST BIOPSY Right 2009   High Risk Stereo Biopsy   . BREAST LUMPECTOMY Right 2009  . BREAST LUMPECTOMY WITH AXILLARY LYMPH NODE BIOPSY Right 08/2007   RT, ER/PR+, T1N0  . 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 KNEE SURGERY Left   . TOTAL ABDOMINAL HYSTERECTOMY    . TOTAL KNEE ARTHROPLASTY Left 04/17/2020    Current Outpatient Medications  Medication Sig Dispense Refill  . Cholecalciferol (VITAMIN D3) 5000 UNITS TABS Take 1 capsule by mouth daily. 30 tablet   . desvenlafaxine (PRISTIQ) 50 MG 24 hr tablet Take 1 tablet (50 mg total) by mouth daily. 90 tablet 3  . gabapentin (NEURONTIN) 100 MG capsule take 1 capsule at night (Patient taking differently: Take 300 mg by mouth daily.) 90 capsule 3  . meclizine (ANTIVERT) 25 MG tablet Take 1 tablet (25 mg total) by mouth 3 (three) times daily as needed for dizziness. 30 tablet 0  . omeprazole (PRILOSEC) 20 MG capsule Take 20 mg by mouth daily.    Marland Kitchen topiramate (TOPAMAX) 100 MG tablet Take 2 tablets (200 mg total) by mouth at bedtime. 180 tablet 3  . vitamin B-12 (CYANOCOBALAMIN) 250 MCG tablet Take 250 mcg by  mouth daily.     No current facility-administered medications for this visit.    Family History  Problem Relation Age of Onset  . Heart attack Father   . Other Mother        bilateral mastectomies at 45y due to precancerous tumors  . Breast cancer Mother   . Healthy Mother   . Mental illness Sister   . Healthy Son   . Healthy Son   . Healthy Daughter   . Breast cancer Maternal Aunt        dx. early 57s  . Heart Problems Maternal Aunt   . Lung cancer Maternal Uncle        dx. 62-62; former smoker  . Lung cancer Maternal Grandmother        dx. 52s; not a smoker; "metastasis to colon"  . Brain cancer Other        dx. >50s  . Cancer Other        dx. <50s; unknown cancer/dx.  33s; unknown type    Review of Systems  All other systems reviewed and are negative.   Exam:   BP 122/68   Pulse 75   Ht 5' 1.5" (1.562 m)   Wt 149 lb (67.6 kg)   SpO2 98%   BMI 27.70 kg/m   Weight change: @WEIGHTCHANGE @ Height:   Height: 5' 1.5" (156.2 cm)  Ht Readings from Last 3 Encounters:  09/02/20 5' 1.5" (1.562 m)  07/10/20 5' 1.5" (1.562 m)  02/22/20 5' 1"  (1.549 m)    General appearance: alert, cooperative and appears stated age Head: Normocephalic, without obvious abnormality, atraumatic Neck: no adenopathy, supple, symmetrical, trachea midline and thyroid normal to inspection and palpation Lungs: clear to auscultation bilaterally Cardiovascular: regular rate and rhythm Breasts: normal appearance, no masses or tenderness. Evidence of right lumpectomy. Abdomen: soft, non-tender; non distended,  no masses,  no organomegaly Extremities: extremities normal, atraumatic, no cyanosis or edema Skin: Skin color, texture, turgor normal. No rashes or lesions Lymph nodes: Cervical, supraclavicular, and axillary nodes normal. No abnormal inguinal nodes palpated Neurologic: Grossly normal   Pelvic: External genitalia:  no lesions              Urethra:  normal appearing urethra with no masses, tenderness or lesions              Bartholins and Skenes: normal                 Vagina: atrophic appearing vagina with normal color and discharge, no lesions              Cervix: absent               Bimanual Exam:  Uterus:  uterus absent              Adnexa: no mass, fullness, tenderness               Rectovaginal: Confirms               Anus:  normal sphincter tone, no lesions  Gae Dry chaperoned for the exam.  1. Well woman exam Discussed breast self exam Discussed calcium and vit D intake Labs with primary Mammogram in 8/22 Colonoscopy in 12/22  2. Low libido Information given

## 2020-09-30 IMAGING — MG DIGITAL SCREENING BILAT W/ TOMO W/ CAD
8 series · 9 of 24 positions shown · non-contrast
Comparison: Previous exam(s).

CLINICAL DATA: Screening.

EXAM:
DIGITAL SCREENING BILATERAL MAMMOGRAM WITH TOMO AND CAD

[R MLO synth-2D]
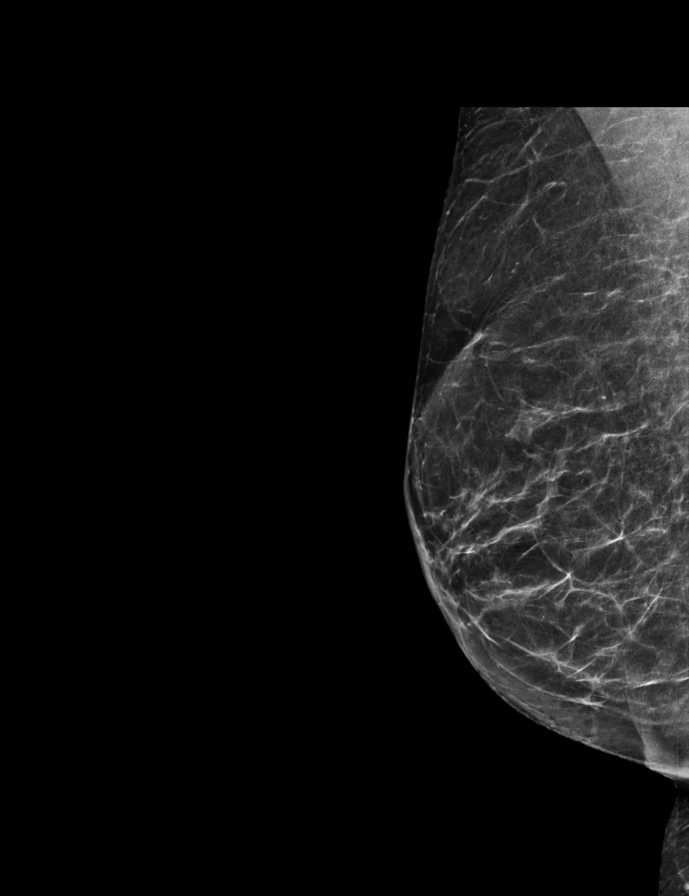

[L CC synth-2D]
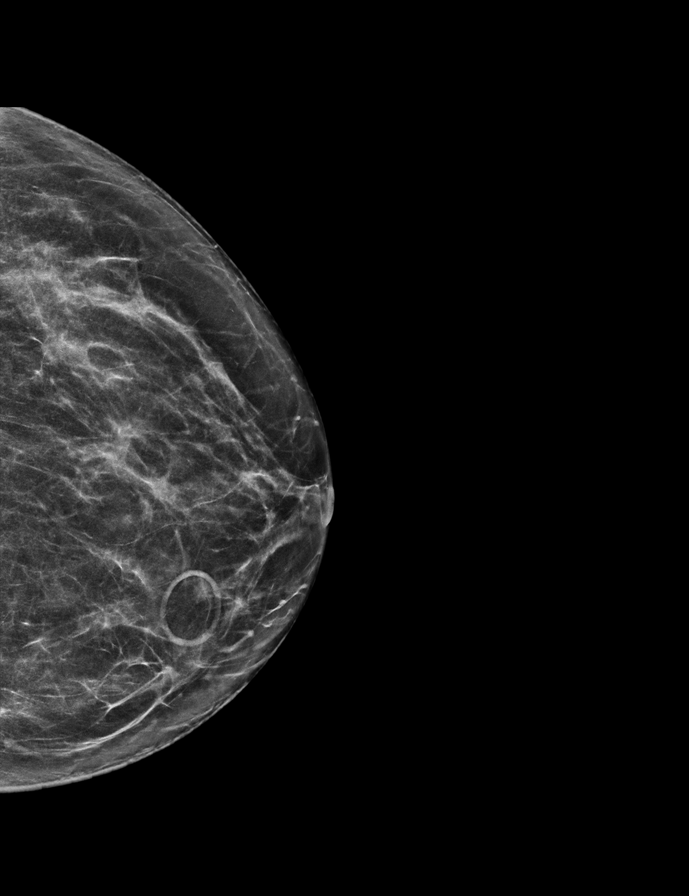

[L MLO synth-2D]
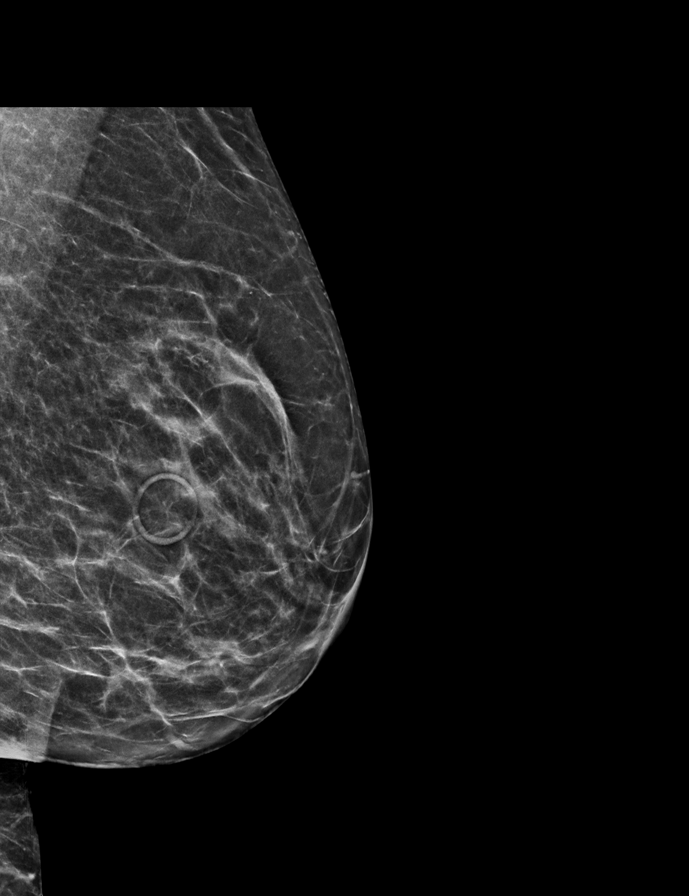

[R CC synth-2D]
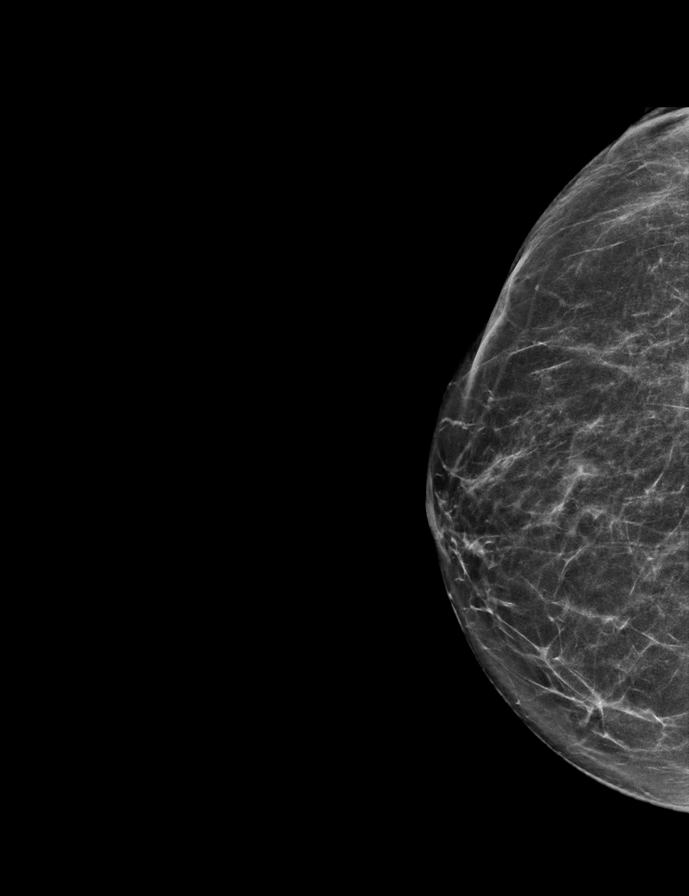

[R CC tomo · 2 of 61 frames shown]
[frame 20/61]
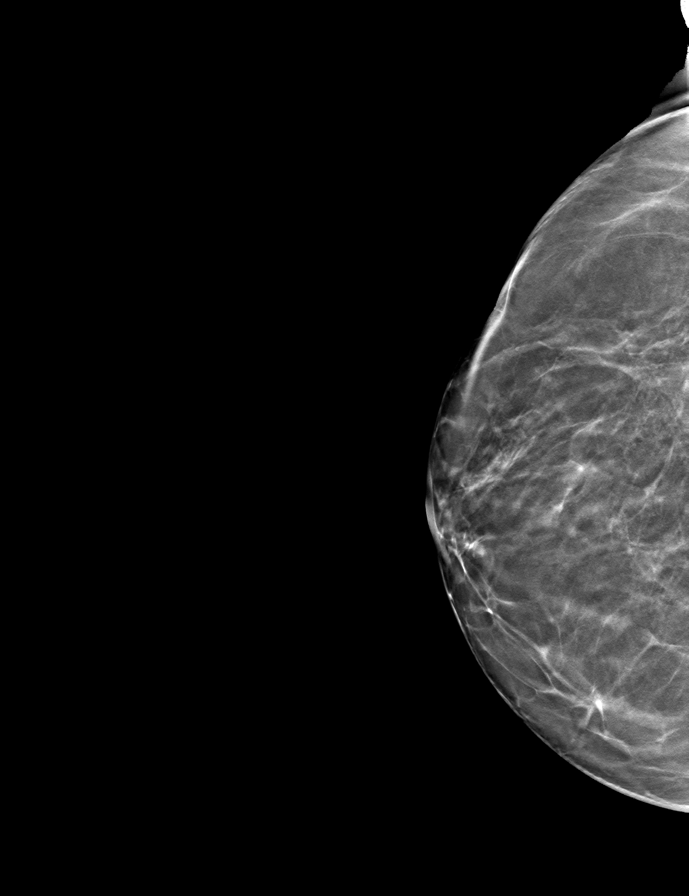
[frame 31/61]
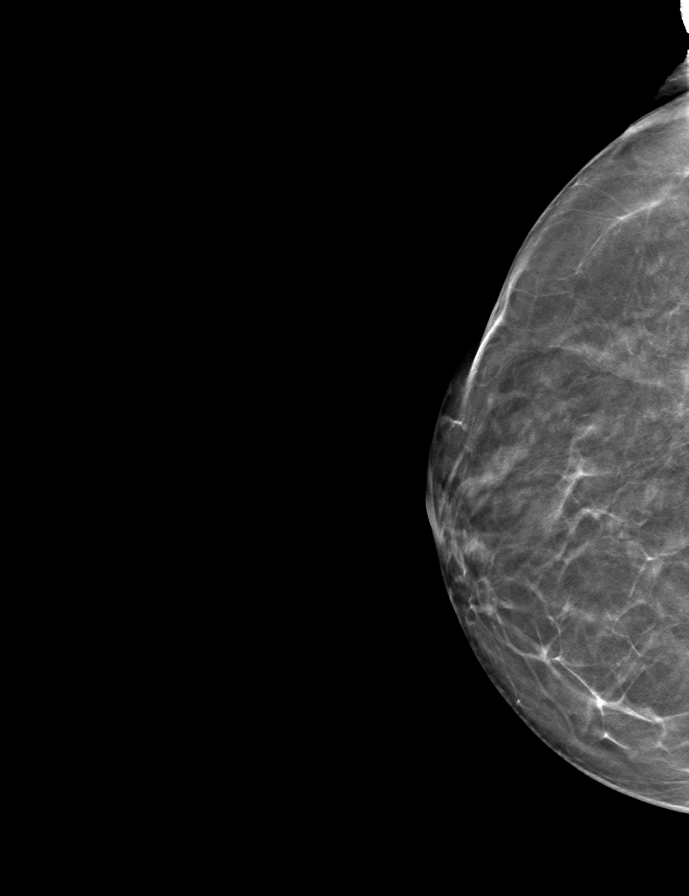

[R MLO tomo · tomo slice 31/60.0]
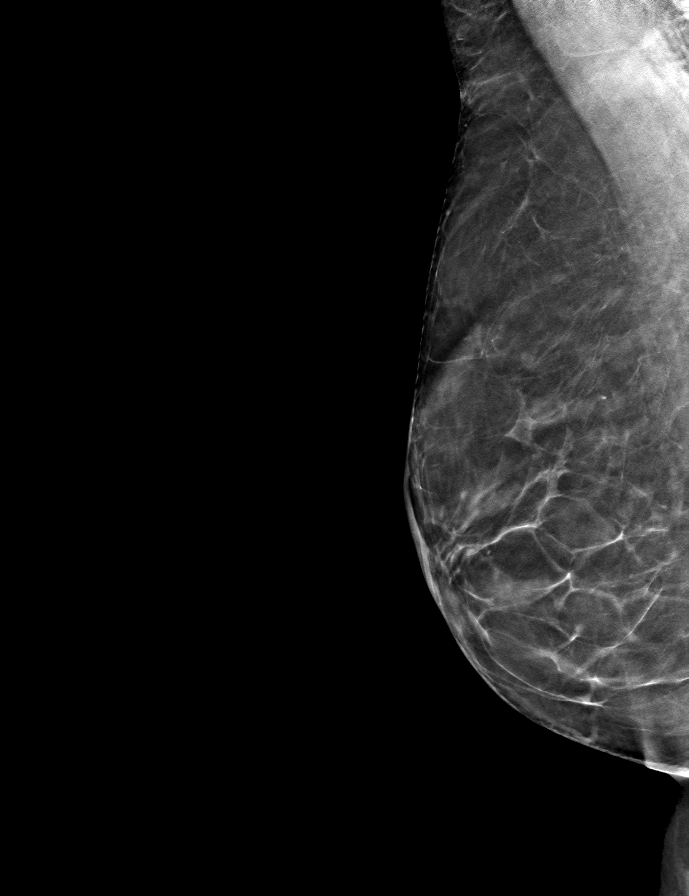

[L CC tomo · tomo slice 30/59.0]
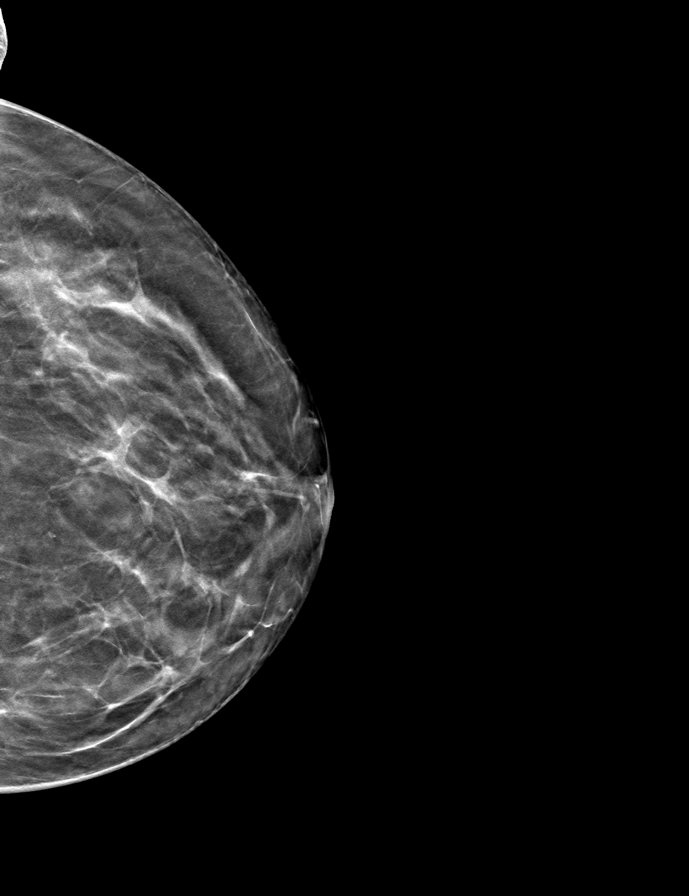

[L MLO tomo · tomo slice 29/57.0]
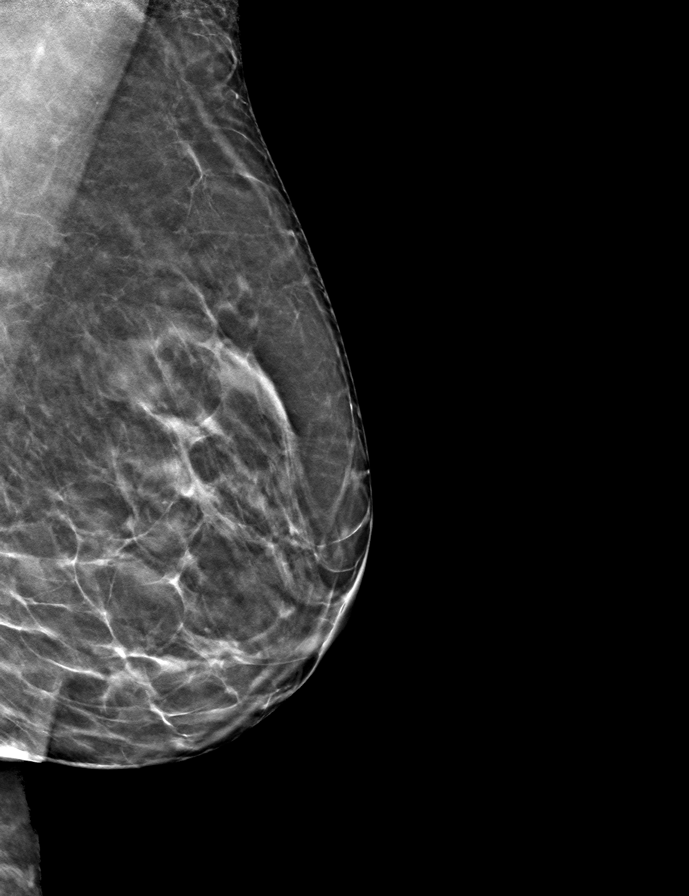

[9 of 24 positions shown; findings below may reference images not displayed]

ACR Breast Density Category b: There are scattered areas of
fibroglandular density.
FINDINGS: There are no findings suspicious for malignancy. Images were
processed with CAD.
IMPRESSION: No mammographic evidence of malignancy. A result letter of this
screening mammogram will be mailed directly to the patient.

RECOMMENDATION:
Screening mammogram in one year. (Code:[id number])

BI-RADS CATEGORY  1: Negative.

## 2020-11-29 ENCOUNTER — Other Ambulatory Visit: Payer: Self-pay | Admitting: Obstetrics and Gynecology

## 2020-11-29 DIAGNOSIS — Z1231 Encounter for screening mammogram for malignant neoplasm of breast: Secondary | ICD-10-CM

## 2020-12-24 NOTE — Progress Notes (Signed)
Office Visit Note  Patient: Anna Navarro             Date of Birth: 13-Jan-1956           MRN: [id number]             PCP: Maury Dus, MD Referring: Maury Dus, MD Visit Date: 01/07/2021 Occupation: @GUAROCC @  Subjective:  Joint stiffness.   History of Present Illness: Elya Tarquinio is a 65 y.o. female with history of osteoarthritis.  She states she gets some stiffness in her joints after prolonged sitting or standing.  She continues to have some discomfort in the left knee joint which has been replaced.  Right knee joint is off-and-on discomfort but not on a regular basis.  She is some stiffness in her hands but she has not seen any joint swelling.  She has been taking calcium and vitamin D and also exercising on a regular basis for the treatment of osteopenia.  Activities of Daily Living:  Patient reports morning stiffness for 0 minutes.   Patient Denies nocturnal pain.  Difficulty dressing/grooming: Denies Difficulty climbing stairs: Denies Difficulty getting out of chair: Denies Difficulty using hands for taps, buttons, cutlery, and/or writing: Denies  Review of Systems  Constitutional:  Negative for fatigue.  HENT:  Negative for mouth sores, mouth dryness and nose dryness.   Eyes:  Negative for pain, itching, visual disturbance and dryness.  Respiratory:  Negative for cough, hemoptysis, shortness of breath and difficulty breathing.   Cardiovascular:  Negative for chest pain, palpitations and swelling in legs/feet.  Gastrointestinal:  Negative for abdominal pain, blood in stool, constipation and diarrhea.  Endocrine: Negative for increased urination.  Genitourinary:  Negative for painful urination.  Musculoskeletal:  Negative for joint pain, joint pain, joint swelling, myalgias, muscle weakness, morning stiffness, muscle tenderness and myalgias.  Skin:  Negative for color change, rash and redness.  Allergic/Immunologic: Negative for susceptible to  infections.  Neurological:  Negative for dizziness, numbness, headaches, memory loss and weakness.  Hematological:  Negative for swollen glands.  Psychiatric/Behavioral:  Positive for sleep disturbance. Negative for confusion.    PMFS History:  Patient Active Problem List   Diagnosis Date Noted   Daytime somnolence 09/02/2020   Major depression in partial remission ([place]) 09/02/2020   Obstructive sleep apnea syndrome 09/02/2020   Peripheral neuropathy 09/02/2020   Sleep disorder 09/02/2020   Vitamin D deficiency 09/02/2020   Elevated blood-pressure reading, without diagnosis of hypertension 08/02/2019   Muscle weakness of upper extremity 08/02/2019   Osteopenia of multiple sites 06/29/2018   Primary osteoarthritis of both hands 06/29/2018   Primary osteoarthritis of both knees 06/29/2018   DDD (degenerative disc disease), lumbar 06/29/2018   Status post gastric bypass for obesity 06/29/2018   Genetic testing 01/28/2015   Rhinitis, allergic 11/02/2014   Hyperlipemia 11/02/2014   Vestibular migraine 10/29/2014   Spondylolisthesis at L5-S1 level 09/06/2014   History of breast cancer 08/18/2012    Past Medical History:  Diagnosis Date   Allergic rhinitis 02/03/2007   Breast cancer ([place]) 2009   IDC of UOQ of right breast; ER/PR+, Her2-, Ki67 10%; s/p lumpectomy and radiation 2009   Chronic eustachian tube dysfunction 05/23/2010   DDD (degenerative disc disease), lumbar    seeing Dr. Ellene Route   Depression 02/03/2007   GERD 11/12/2006   H/O iron deficiency anemia 05/09/2007   per pt from grastic bypass   Hyperlipemia 11/12/2006   Low back pain 02/03/2007   Neck mass 01/03/2007  Osteoporosis    Personal history of radiation therapy 2009   UTI (lower urinary tract infection) 11/12/2006   Vestibular neuritis    Warts 01/07/2010   hands    Family History  Problem Relation Age of Onset   Heart attack Father    Other Mother        bilateral mastectomies at [facility] due to precancerous tumors    Breast cancer Mother    Healthy Mother    Mental illness Sister    Healthy Son    Healthy Son    Healthy Daughter    Breast cancer Maternal Aunt        dx. early 69s   Heart Problems Maternal Aunt    Lung cancer Maternal Uncle        dx. 62-62; former smoker   Lung cancer Maternal Grandmother        dx. 1s; not a smoker; "metastasis to colon"   Brain cancer Other        dx. >50s   Cancer Other        dx. <50s; unknown cancer/dx. 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      Home Situation: lives with husband      Spiritual Beliefs: Jewish      Lifestyle: regular exercise 2-4 days per week; trying to eat healthy      Immunization History  Administered Date(s) Administered   Influenza, Quadrivalent, Recombinant, Inj, Pf 01/12/2019   PFIZER(Purple Top)SARS-COV-2 Vaccination 07/14/2019, 08/04/2019, 03/01/2020   Td 05/18/1993, 01/12/2019     Objective: Vital Signs: BP 126/83 (BP Location: Left Arm, Patient Position: Sitting, Cuff Size: Normal)   Pulse 69   Ht 5' 1.5" (1.562 m)   Wt 146 lb 12.8 oz (66.6 kg)   BMI 27.29 kg/m    Physical Exam Vitals and nursing note reviewed.  Constitutional:      Appearance: She is well-developed.  HENT:     Head: Normocephalic and atraumatic.  Eyes:     Conjunctiva/sclera: Conjunctivae normal.  Cardiovascular:     Rate and Rhythm: Normal rate and regular rhythm.     Heart sounds: Normal heart sounds.  Pulmonary:     Effort: Pulmonary effort is normal.     Breath sounds:  Normal breath sounds.  Abdominal:     General: Bowel sounds are normal.     Palpations: Abdomen is soft.  Musculoskeletal:     Cervical back: Normal range of motion.  Lymphadenopathy:     Cervical: No cervical adenopathy.  Skin:    General: Skin is warm and dry.     Capillary Refill: Capillary refill takes less than 2 seconds.  Neurological:     Mental Status: She is alert and oriented to person, place, and time.  Psychiatric:        Behavior: Behavior normal.     Musculoskeletal Exam: C-spine was in good range of motion.  Shoulder joints, elbow joints, wrist joints, MCPs PIPs and DIPs with good  range of motion.  She had bilateral PIP and DIP thickening with no synovitis.  Hip joints were in good range of motion.  Left knee joint is replaced with limited extension.  Right knee joint was in good range of motion without any warmth swelling or effusion.  There was no tenderness over ankles or MTPs.  CDAI Exam: CDAI Score: -- Patient Global: --; Provider Global: -- Swollen: --; Tender: -- Joint Exam 01/07/2021   No joint exam has been documented for this visit   There is currently no information documented on the homunculus. Go to the Rheumatology activity and complete the homunculus joint exam.  Investigation: No additional findings.  Imaging: No results found.  Recent Labs: Lab Results  Component Value Date   WBC 5.7 07/13/2019   HGB 13.9 07/13/2019   PLT 257 07/13/2019   NA 142 07/13/2019   K 4.2 07/13/2019   CL 108 07/13/2019   CO2 29 07/13/2019   GLUCOSE 90 07/13/2019   BUN 21 07/13/2019   CREATININE 0.73 07/13/2019   BILITOT 0.3 07/13/2019   ALKPHOS 51 07/18/2013   AST 18 07/13/2019   ALT 20 07/13/2019   PROT 6.3 07/13/2019   ALBUMIN 3.8 07/18/2013   CALCIUM 9.4 07/13/2019   GFRAA 101 07/13/2019    Speciality Comments: No specialty comments available.  Procedures:  No procedures performed Allergies: Patient has no known allergies.   Assessment / Plan:      Visit Diagnoses: Osteopenia of multiple sites - 03/28/20 DEXA:  The BMD measured at Femur Total Left is 0.734 g/cm2 with a T-score of -2.2. Previous DEXA on Oct 01, 2017 T score of -2.4.  Use of calcium, vitamin D and resistive exercises were discussed.  We will repeat DEXA scan in November 2023.  Medication monitoring encounter - Previously taking Actonel 150 mg monthly (for about 1 year), but she discontinued due to experiencing severe arthralgias.  Primary osteoarthritis of both hands-joint protection muscle strengthening was discussed.  A handout on exercises was given.  Use of natural anti-inflammatories was discussed.  Primary osteoarthritis of right knee-a handout on lower extremity muscle strengthening exercises was given.  History of total knee arthroplasty, left - Performed by Dr. Noemi Chapel 12/21.  She has chronic discomfort and limited extension.  DDD (degenerative disc disease), lumbar-she denies any discomfort today.  Mixed hyperlipidemia  Vestibular migraine  History of gastroesophageal reflux (GERD)  History of breast cancer  Status post gastric bypass for obesity  Orders: No orders of the defined types were placed in this encounter.  No orders of the defined types were placed in this encounter.    Follow-Up Instructions: Return in about 1 year (around 01/07/2022) for Osteoarthritis, OPenia.   Bo Merino, MD  Note - This record has been created using Editor, commissioning.  Chart creation errors have been sought, but may not always  have been located. Such creation errors do not reflect on  the standard of medical care.

## 2021-01-07 ENCOUNTER — Ambulatory Visit: Payer: BC Managed Care – PPO | Admitting: Rheumatology

## 2021-01-07 ENCOUNTER — Other Ambulatory Visit: Payer: Self-pay

## 2021-01-07 ENCOUNTER — Encounter: Payer: Self-pay | Admitting: Rheumatology

## 2021-01-07 VITALS — BP 126/83 | HR 69 | Ht 61.5 in | Wt 146.8 lb

## 2021-01-07 DIAGNOSIS — Z5181 Encounter for therapeutic drug level monitoring: Secondary | ICD-10-CM

## 2021-01-07 DIAGNOSIS — M19041 Primary osteoarthritis, right hand: Secondary | ICD-10-CM | POA: Diagnosis not present

## 2021-01-07 DIAGNOSIS — M1711 Unilateral primary osteoarthritis, right knee: Secondary | ICD-10-CM

## 2021-01-07 DIAGNOSIS — M51369 Other intervertebral disc degeneration, lumbar region without mention of lumbar back pain or lower extremity pain: Secondary | ICD-10-CM

## 2021-01-07 DIAGNOSIS — M19042 Primary osteoarthritis, left hand: Secondary | ICD-10-CM

## 2021-01-07 DIAGNOSIS — M5136 Other intervertebral disc degeneration, lumbar region: Secondary | ICD-10-CM

## 2021-01-07 DIAGNOSIS — Z96652 Presence of left artificial knee joint: Secondary | ICD-10-CM

## 2021-01-07 DIAGNOSIS — Z8719 Personal history of other diseases of the digestive system: Secondary | ICD-10-CM

## 2021-01-07 DIAGNOSIS — G43809 Other migraine, not intractable, without status migrainosus: Secondary | ICD-10-CM

## 2021-01-07 DIAGNOSIS — M8589 Other specified disorders of bone density and structure, multiple sites: Secondary | ICD-10-CM | POA: Diagnosis not present

## 2021-01-07 DIAGNOSIS — Z853 Personal history of malignant neoplasm of breast: Secondary | ICD-10-CM

## 2021-01-07 DIAGNOSIS — Z9884 Bariatric surgery status: Secondary | ICD-10-CM

## 2021-01-07 DIAGNOSIS — E782 Mixed hyperlipidemia: Secondary | ICD-10-CM

## 2021-01-07 NOTE — Patient Instructions (Signed)
Journal for Nurse Practitioners, 15(4), 263-267. Retrieved February 21, 2018 from [email or web address].com/[email or web address]">  Knee Exercises Ask your health care provider which exercises are safe for you. Do exercises exactly as told by your health care provider and adjust them as directed. It is normal to feel mild stretching, pulling, tightness, or discomfort as you do these exercises. Stop right away if you feel sudden pain or your pain gets worse. Do not begin these exercises until told by your health care provider. Stretching and range-of-motion exercises These exercises warm up your muscles and joints and improve the movement and flexibility of your knee. These exercises also help to relieve pain andswelling. Knee extension, prone Lie on your abdomen (prone position) on a bed. Place your left / right knee just beyond the edge of the surface so your knee is not on the bed. You can put a towel under your left / right thigh just above your kneecap for comfort. Relax your leg muscles and allow gravity to straighten your knee (extension). You should feel a stretch behind your left / right knee. Hold this position for __________ seconds. Scoot up so your knee is supported between repetitions. Repeat __________ times. Complete this exercise __________ times a day. Knee flexion, active  Lie on your back with both legs straight. If this causes back discomfort, bend your left / right knee so your foot is flat on the floor. Slowly slide your left / right heel back toward your buttocks. Stop when you feel a gentle stretch in the front of your knee or thigh (flexion). Hold this position for __________ seconds. Slowly slide your left / right heel back to the starting position. Repeat __________ times. Complete this exercise __________ times a day. Quadriceps stretch, prone  Lie on your abdomen on a firm surface, such as a bed or padded floor. Bend your left / right knee and hold your ankle. If you cannot reach  your ankle or pant leg, loop a belt around your foot and grab the belt instead. Gently pull your heel toward your buttocks. Your knee should not slide out to the side. You should feel a stretch in the front of your thigh and knee (quadriceps). Hold this position for __________ seconds. Repeat __________ times. Complete this exercise __________ times a day. Hamstring, supine Lie on your back (supine position). Loop a belt or towel over the ball of your left / right foot. The ball of your foot is on the walking surface, right under your toes. Straighten your left / right knee and slowly pull on the belt to raise your leg until you feel a gentle stretch behind your knee (hamstring). Do not let your knee bend while you do this. Keep your other leg flat on the floor. Hold this position for __________ seconds. Repeat __________ times. Complete this exercise __________ times a day. Strengthening exercises These exercises build strength and endurance in your knee. Endurance is theability to use your muscles for a long time, even after they get tired. Quadriceps, isometric This exercise stretches the muscles in front of your thigh (quadriceps) without moving your knee joint (isometric). Lie on your back with your left / right leg extended and your other knee bent. Put a rolled towel or small pillow under your knee if told by your health care provider. Slowly tense the muscles in the front of your left / right thigh. You should see your kneecap slide up toward your hip or see increased dimpling just above the knee. This motion will   push the back of the knee toward the floor. For __________ seconds, hold the muscle as tight as you can without increasing your pain. Relax the muscles slowly and completely. Repeat __________ times. Complete this exercise __________ times a day. Straight leg raises This exercise stretches the muscles in front of your thigh (quadriceps) and the muscles that move your hips (hip  flexors). Lie on your back with your left / right leg extended and your other knee bent. Tense the muscles in the front of your left / right thigh. You should see your kneecap slide up or see increased dimpling just above the knee. Your thigh may even shake a bit. Keep these muscles tight as you raise your leg 4-6 inches (10-15 cm) off the floor. Do not let your knee bend. Hold this position for __________ seconds. Keep these muscles tense as you lower your leg. Relax your muscles slowly and completely after each repetition. Repeat __________ times. Complete this exercise __________ times a day. Hamstring, isometric Lie on your back on a firm surface. Bend your left / right knee about __________ degrees. Dig your left / right heel into the surface as if you are trying to pull it toward your buttocks. Tighten the muscles in the back of your thighs (hamstring) to "dig" as hard as you can without increasing any pain. Hold this position for __________ seconds. Release the tension gradually and allow your muscles to relax completely for __________ seconds after each repetition. Repeat __________ times. Complete this exercise __________ times a day. Hamstring curls If told by your health care provider, do this exercise while wearing ankle weights. Begin with __________ lb weights. Then increase the weight by 1 lb (0.5 kg) increments. Do not wear ankle weights that are more than __________ lb. Lie on your abdomen with your legs straight. Bend your left / right knee as far as you can without feeling pain. Keep your hips flat against the floor. Hold this position for __________ seconds. Slowly lower your leg to the starting position. Repeat __________ times. Complete this exercise __________ times a day. Squats This exercise strengthens the muscles in front of your thigh and knee (quadriceps). Stand in front of a table, with your feet and knees pointing straight ahead. You may rest your hands on the  table for balance but not for support. Slowly bend your knees and lower your hips like you are going to sit in a chair. Keep your weight over your heels, not over your toes. Keep your lower legs upright so they are parallel with the table legs. Do not let your hips go lower than your knees. Do not bend lower than told by your health care provider. If your knee pain increases, do not bend as low. Hold the squat position for __________ seconds. Slowly push with your legs to return to standing. Do not use your hands to pull yourself to standing. Repeat __________ times. Complete this exercise __________ times a day. Wall slides This exercise strengthens the muscles in front of your thigh and knee (quadriceps). Lean your back against a smooth wall or door, and walk your feet out 18-24 inches (46-61 cm) from it. Place your feet hip-width apart. Slowly slide down the wall or door until your knees bend __________ degrees. Keep your knees over your heels, not over your toes. Keep your knees in line with your hips. Hold this position for __________ seconds. Repeat __________ times. Complete this exercise __________ times a day. Straight leg raises This exercise   strengthens the muscles that rotate the leg at the hip and move it away from your body (hip abductors). Lie on your side with your left / right leg in the top position. Lie so your head, shoulder, knee, and hip line up. You may bend your bottom knee to help you keep your balance. Roll your hips slightly forward so your hips are stacked directly over each other and your left / right knee is facing forward. Leading with your heel, lift your top leg 4-6 inches (10-15 cm). You should feel the muscles in your outer hip lifting. Do not let your foot drift forward. Do not let your knee roll toward the ceiling. Hold this position for __________ seconds. Slowly return your leg to the starting position. Let your muscles relax completely after each  repetition. Repeat __________ times. Complete this exercise __________ times a day. Straight leg raises This exercise stretches the muscles that move your hips away from the front of the pelvis (hip extensors). Lie on your abdomen on a firm surface. You can put a pillow under your hips if that is more comfortable. Tense the muscles in your buttocks and lift your left / right leg about 4-6 inches (10-15 cm). Keep your knee straight as you lift your leg. Hold this position for __________ seconds. Slowly lower your leg to the starting position. Let your leg relax completely after each repetition. Repeat __________ times. Complete this exercise __________ times a day. This information is not intended to replace advice given to you by your health care provider. Make sure you discuss any questions you have with your healthcare provider. Document Revised: 02/22/2018 Document Reviewed: 02/22/2018 Else[employer] Patient Education  2022 [employer] Hand Exercises Hand exercises can be helpful for almost anyone. These exercises can strengthen the hands, improve flexibility and movement, and increase blood flow to the hands. These results can make work and daily tasks easier. Hand exercises can be especially helpful for people who have joint pain from arthritis or have nerve damage from overuse (carpal tunnel syndrome). These exercises can also help people who have injured a hand. Exercises Most of these hand exercises are gentle stretching and motion exercises. It is usually safe to do them often throughout the day. Warming up your hands before exercise may help to reduce stiffness. You can do this with gentle massage orby placing your hands in warm water for 10-15 minutes. It is normal to feel some stretching, pulling, tightness, or mild discomfort as you begin new exercises. This will gradually improve. Stop an exercise right away if you feel sudden, severe pain or your pain gets worse. Ask your healthcare  provider which exercises are best for you. Knuckle bend or "claw" fist Stand or sit with your arm, hand, and all five fingers pointed straight up. Make sure to keep your wrist straight during the exercise. Gently bend your fingers down toward your palm until the tips of your fingers are touching the top of your palm. Keep your big knuckle straight and just bend the small knuckles in your fingers. Hold this position for __________ seconds. Straighten (extend) your fingers back to the starting position. Repeat this exercise 5-10 times with each hand. Full finger fist Stand or sit with your arm, hand, and all five fingers pointed straight up. Make sure to keep your wrist straight during the exercise. Gently bend your fingers into your palm until the tips of your fingers are touching the middle of your palm. Hold this position for __________ seconds.   Extend your fingers back to the starting position, stretching every joint fully. Repeat this exercise 5-10 times with each hand. Straight fist Stand or sit with your arm, hand, and all five fingers pointed straight up. Make sure to keep your wrist straight during the exercise. Gently bend your fingers at the big knuckle, where your fingers meet your hand, and the middle knuckle. Keep the knuckle at the tips of your fingers straight and try to touch the bottom of your palm. Hold this position for __________ seconds. Extend your fingers back to the starting position, stretching every joint fully. Repeat this exercise 5-10 times with each hand. Tabletop Stand or sit with your arm, hand, and all five fingers pointed straight up. Make sure to keep your wrist straight during the exercise. Gently bend your fingers at the big knuckle, where your fingers meet your hand, as far down as you can while keeping the small knuckles in your fingers straight. Think of forming a tabletop with your fingers. Hold this position for __________ seconds. Extend your fingers  back to the starting position, stretching every joint fully. Repeat this exercise 5-10 times with each hand. Finger spread Place your hand flat on a table with your palm facing down. Make sure your wrist stays straight as you do this exercise. Spread your fingers and thumb apart from each other as far as you can until you feel a gentle stretch. Hold this position for __________ seconds. Bring your fingers and thumb tight together again. Hold this position for __________ seconds. Repeat this exercise 5-10 times with each hand. Making circles Stand or sit with your arm, hand, and all five fingers pointed straight up. Make sure to keep your wrist straight during the exercise. Make a circle by touching the tip of your thumb to the tip of your index finger. Hold for __________ seconds. Then open your hand wide. Repeat this motion with your thumb and each finger on your hand. Repeat this exercise 5-10 times with each hand. Thumb motion Sit with your forearm resting on a table and your wrist straight. Your thumb should be facing up toward the ceiling. Keep your fingers relaxed as you move your thumb. Lift your thumb up as high as you can toward the ceiling. Hold for __________ seconds. Bend your thumb across your palm as far as you can, reaching the tip of your thumb for the small finger (pinkie) side of your palm. Hold for __________ seconds. Repeat this exercise 5-10 times with each hand. Grip strengthening  Hold a stress ball or other soft ball in the middle of your hand. Slowly increase the pressure, squeezing the ball as much as you can without causing pain. Think of bringing the tips of your fingers into the middle of your palm. All of your finger joints should bend when doing this exercise. Hold your squeeze for __________ seconds, then relax. Repeat this exercise 5-10 times with each hand. Contact a health care provider if: Your hand pain or discomfort gets much worse when you do an  exercise. Your hand pain or discomfort does not improve within 2 hours after you exercise. If you have any of these problems, stop doing these exercises right away. Do not do them again unless your health care provider says that you can. Get help right away if: You develop sudden, severe hand pain or swelling. If this happens, stop doing these exercises right away. Do not do them again unless your health care provider says that you can. This   information is not intended to replace advice given to you by your health care provider. Make sure you discuss any questions you have with your healthcare provider. Document Revised: 08/25/2018 Document Reviewed: 05/05/2018 Elsevier Patient Education  2022 [employer]  

## 2021-01-23 ENCOUNTER — Ambulatory Visit: Payer: BC Managed Care – PPO

## 2021-02-21 NOTE — Progress Notes (Signed)
NEUROLOGY FOLLOW UP OFFICE NOTE  Anna Navarro [id number]  Assessment/Plan:   Vestibular migraine  Migraine prevention:  topiramate 243m at bedtime Migraine rescue:  diazepam and/or meclizine for vertigo, naproxen for headache Limit use of pain relievers to no more than 2 days out of week to prevent risk of rebound or medication-overuse headache. Keep headache diary Follow up one year   Subjective:  CMadgeline Rayois a 65year old right-handed woman with depression and history of breast cancer status post lumpectomy and radiation in 2009 who follows up for vestibular migraine.   UPDATE: Infrequent.  Only a couple of severe episodes a year, usually when she first begins the school year.  Lasts a few hours. Rescue protocol:  Diazepam 2.575m(less if taken at work due to concerns of drowsiness) and sometimes meclizine Current NSAIDS:  Naproxen 50026murrent analgesics:  no Current triptans:  no Current ergotamine:  no Current anti-emetic:  no Current muscle relaxants:  no Current anti-anxiolytic:  Diazepam 2.5mg34mor acute attacks of vertigo) Current sleep aide:  no Current Antihypertensive medications:  no Current Antidepressant medications:  Pristiq Current Anticonvulsant medications:  topiramate 200mg67mbedtime; gabapentin 300mg 1medtime Current anti-CGRP:  no Current Vitamins/Herbal/Supplements:  B12 Current Antihistamines/Decongestants:  meclizine Other therapy:  no Hormone/birth control:  Estradiol   Caffeine:  Very little.  No coffee Alcohol:  2 glasses of wine per week Smoker:  no Diet:  A few glasses of water Exercise:  yes Depression:  no; Anxiety:  no Other pain:  left knee.  Needs TKR. Sleep hygiene:  Good   12/28/2019 LABS:  CBC with WBC 5, HGB 14.2, HCT 41.6, PLT 231; CMP with Na 144, K 4.2, Cl 108, CO2 29, BUN 18, Cr 0.89, glucose 101.   HISTORY: She began having recurrent episodes of dizziness in March 2015.  She describes it as a  sensation of movement, like during a drop on a roller coaster, as well as spinning.  It occurs spontaneously and at rest, but can be aggravated with movement.  Sometimes it is so severe, she was unable to drive and needed her husband to come and pick her up.  She may feel a little quesy, but she denies any significant nausea and no vomiting.  She endorses photophobia, phonophobia and osmophobia.  She denies lightheadedness, vision loss, focal numbness or weakness, or slurred speech. It is associated with a dull, 6/10 non-throbbing holocephalic ache.  There are no known triggers.  Only rest and sleep help.  Initially, at time of her first visit, t typically lasts 2-3 days and occurred once a week to once every other week.  There are no specific relieving factors.  She has taken meclizine, which is not too effective.  Even though the spells are episodic, she didn't quite feel right in between spells.   She was evaluated by Dr. Kraus Thornell [facility] showed central findings.  She had an MRI of the brain with and without contrast, which was reportedly normal.   She denies preceding viral illness or head trauma.  She denies prior history of migraine.  She denies family history of migraine.  PAST MEDICAL HISTORY: Past Medical History:  Diagnosis Date   Allergic rhinitis 02/03/2007   [place]   IDC of UOQ of right breast; ER/PR+, Her2-, Ki67 10%; s/p lumpectomy and radiation 2009   Chronic eustachian tube dysfunction 05/23/2010   DDD (degenerative disc disease), lumbar  seeing Dr. 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     Cholecalciferol (VITAMIN D3) 5000 UNITS TABS Take 1 capsule by mouth daily. 30 tablet    D-MANNOSE PO Take by mouth daily.     desvenlafaxine (PRISTIQ) 50 MG 24 hr tablet Take 1 tablet (50 mg total) by mouth daily. 90 tablet 3   gabapentin (NEURONTIN) 100 MG capsule take 1 capsule at night (Patient taking differently: Take 300 mg by mouth daily.) 90 capsule 3   meclizine (ANTIVERT) 25 MG tablet Take 1 tablet (25 mg total) by mouth 3 (three) times daily as needed for dizziness. 30 tablet 0   omeprazole (PRILOSEC) 20 MG capsule Take 20 mg by mouth daily.     topiramate (TOPAMAX) 100 MG tablet Take 2 tablets (200 mg total) by mouth at bedtime. 180 tablet 3   vitamin B-12 (CYANOCOBALAMIN) 250 MCG tablet Take 250 mcg by mouth daily.     No current facility-administered medications on file prior to visit.    ALLERGIES: No Known Allergies  FAMILY HISTORY: Family History  Problem Relation Age of Onset   Heart attack Father    Other Mother        bilateral mastectomies at [place] due to precancerous tumors   Breast cancer Mother    Healthy Mother    Mental illness Sister    Healthy Son    Healthy Son    Healthy Daughter    Breast cancer Maternal Aunt        dx. early 78s   Heart Problems Maternal Aunt    Lung cancer Maternal Uncle        dx. 62-62; former smoker   Lung cancer Maternal Grandmother        dx. 56s; not a smoker; "metastasis to colon"   Brain cancer Other        dx. >50s   Cancer Other        dx. <50s; unknown cancer/dx. 30s; unknown type      Objective:  Blood pressure 117/81, pulse 77, resp. rate 18, height 5' 1"  (1.549 m), weight 145 lb (65.8 kg), SpO2 96 %. General: No acute distress.  Patient appears well-groomed.   Head:  Normocephalic/atraumatic Eyes:  Fundi examined but not visualized Neck: supple, no paraspinal tenderness, full range of  motion Heart:  Regular rate and rhythm Lungs:  Clear to auscultation bilaterally Back: No paraspinal tenderness Neurological Exam: alert and oriented to person, place, and time.  Speech fluent and not dysarthric, language intact.  CN II-XII intact. Bulk and tone normal, muscle strength 5/5 throughout.  Sensation to light touch intact.  Deep tendon reflexes 2+ throughout, toes downgoing.  Finger to nose testing intact.  Gait normal, Romberg negative.   Metta Clines, DO  CC: Maury Dus, MD

## 2021-02-24 ENCOUNTER — Ambulatory Visit: Payer: BC Managed Care – PPO | Admitting: Neurology

## 2021-02-24 ENCOUNTER — Other Ambulatory Visit: Payer: Self-pay

## 2021-02-24 ENCOUNTER — Encounter: Payer: Self-pay | Admitting: Neurology

## 2021-02-24 VITALS — BP 117/81 | HR 77 | Resp 18 | Ht 61.0 in | Wt 145.0 lb

## 2021-02-24 DIAGNOSIS — G43809 Other migraine, not intractable, without status migrainosus: Secondary | ICD-10-CM

## 2021-02-24 MED ORDER — DIAZEPAM 5 MG PO TABS
ORAL_TABLET | ORAL | 2 refills | Status: DC
Start: 2021-02-24 — End: 2023-02-25

## 2021-02-24 MED ORDER — TOPIRAMATE 100 MG PO TABS
200.0000 mg | ORAL_TABLET | Freq: Every day | ORAL | 3 refills | Status: DC
Start: 2021-02-24 — End: 2022-02-24

## 2021-02-24 MED ORDER — MECLIZINE HCL 25 MG PO TABS: 25.0000 mg | ORAL_TABLET | Freq: Three times a day (TID) | ORAL | 5 refills | Status: DC | PRN

## 2021-02-24 NOTE — Patient Instructions (Signed)
Refilled topiramate, diazepam and meclizine Follow up one year

## 2021-02-26 ENCOUNTER — Ambulatory Visit
Admission: RE | Admit: 2021-02-26 | Discharge: 2021-02-26 | Disposition: A | Discharge status: Home / Self Care | Payer: BC Managed Care – PPO | Source: Ambulatory Visit | Attending: Obstetrics and Gynecology | Admitting: Obstetrics and Gynecology

## 2021-02-26 ENCOUNTER — Other Ambulatory Visit: Payer: Self-pay

## 2021-02-26 DIAGNOSIS — Z1231 Encounter for screening mammogram for malignant neoplasm of breast: Secondary | ICD-10-CM

## 2021-09-03 ENCOUNTER — Ambulatory Visit: Payer: BC Managed Care – PPO | Admitting: Obstetrics and Gynecology

## 2021-10-24 NOTE — Progress Notes (Signed)
66 y.o. G28P1102 Married Declined  female here for annual exam.   Husband had prostate cancer in 2/23, not sexually active at the moment. No h/o dyspareunia with lubrication.   H/O right breast cancer in 2009   H/O hysterectomy   H/O osteopenia with elevated risk of fracture. She hasn't tolerated biphosphonates or Evista. Followed by her Rheumatology.  No bowel or bladder c/o.   No LMP recorded. Patient has had a hysterectomy.          Sexually active: Yes.    The current method of family planning is status post hysterectomy.    Exercising: Yes.     Walking  Smoker:  no  Health Maintenance: Pap:  2006  History of abnormal Pap:  no MMG:  03/04/21 density B Bi-rads 1 neg  BMD:   03/28/20 osteopenic  T-score -2.2, FRAX not calculated secondary to prior use of Evista. Dr Estanislado Pandy is following.  Colonoscopy: 06/23/21 colonoscopy normal. F/U 5 years, prior h/o polyp. TDaP:  02/2019 Gardasil: n/a   reports that she has never smoked. She has never used smokeless tobacco. She reports current alcohol use of about 2.0 standard drinks of alcohol per week. She reports that she does not use drugs. 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 Her 3 children are local, 2 sons and one daughter. Oldest son is married and has a 36 month old son. 68 year old mom is local, lives independently.   Past Medical History:  Diagnosis Date   Allergic rhinitis 02/03/2007   Breast cancer ([place]) 2009   IDC of UOQ of right breast; ER/PR+, Her2-, Ki67 10%; s/p lumpectomy and radiation 2009   Chronic eustachian tube dysfunction 05/23/2010   DDD (degenerative disc disease), lumbar    seeing Dr. Ellene Route   Depression 02/03/2007   GERD 11/12/2006   H/O iron deficiency anemia 05/09/2007   per pt from grastic bypass   Hyperlipemia 11/12/2006   Low back pain 02/03/2007   Neck mass 01/03/2007   Osteoporosis    Personal history of radiation therapy 2009   UTI (lower urinary tract infection) 11/12/2006   Vestibular neuritis     Warts 01/07/2010   hands    Past Surgical History:  Procedure Laterality Date   BREAST BIOPSY Right 2009   High Risk Stereo Biopsy    BREAST LUMPECTOMY Right 2009   BREAST LUMPECTOMY WITH AXILLARY LYMPH NODE BIOPSY Right 08/2007   RT, ER/PR+, T1N0   GASTRIC BYPASS  01/25/2006   Roux-en-Y gastric bypass   KNEE SURGERY Left    TOTAL ABDOMINAL HYSTERECTOMY     TOTAL KNEE ARTHROPLASTY Left 04/17/2020    Current Outpatient Medications  Medication Sig Dispense Refill   AZO-CRANBERRY PO Take by mouth daily.     Cholecalciferol (VITAMIN D3) 5000 UNITS TABS Take 1 capsule by mouth daily. 30 tablet    D-MANNOSE PO Take by mouth daily.     desvenlafaxine (PRISTIQ) 50 MG 24 hr tablet Take 1 tablet (50 mg total) by mouth daily. 90 tablet 3   diazepam (VALIUM) 5 MG tablet Take 0.5 tab every 6 hours as needed for dizziness.  Caution for drowsiness. 15 tablet 2   gabapentin (NEURONTIN) 100 MG capsule take 1 capsule at night (Patient taking differently: Take 300 mg by mouth daily.) 90 capsule 3   meclizine (ANTIVERT) 25 MG tablet Take 1 tablet (25 mg total) by mouth 3 (three) times daily as needed for dizziness. 30 tablet 5   omeprazole (PRILOSEC) 20 MG capsule Take  20 mg by mouth daily.     topiramate (TOPAMAX) 100 MG tablet Take 2 tablets (200 mg total) by mouth at bedtime. 180 tablet 3   vitamin B-12 (CYANOCOBALAMIN) 250 MCG tablet Take 250 mcg by mouth daily.     No current facility-administered medications for this visit.    Family History  Problem Relation Age of Onset   Heart attack Father    Other Mother        bilateral mastectomies at [facility] due to precancerous tumors   Breast cancer Mother    Healthy Mother    Mental illness Sister    Healthy Son    Healthy Son    Healthy Daughter    Breast cancer Maternal Aunt        dx. early 9s   Heart Problems Maternal Aunt    Lung cancer Maternal Uncle        dx. 62-62; former smoker   Lung cancer Maternal Grandmother        dx. 25s;  not a smoker; "metastasis to colon"   Brain cancer Other        dx. >50s   Cancer Other        dx. <50s; unknown cancer/dx. 52s; unknown type    Review of Systems  All other systems reviewed and are negative.   Exam:   BP 122/80   Pulse 83   Ht 5' 1.5" (1.562 m)   Wt 137 lb (62.1 kg)   SpO2 100%   BMI 25.47 kg/m   Weight change: @WEIGHTCHANGE @ Height:   Height: 5' 1.5" (156.2 cm)  Ht Readings from Last 3 Encounters:  11/05/21 5' 1.5" (1.562 m)  02/24/21 5' 1"  (1.549 m)  01/07/21 5' 1.5" (1.562 m)    General appearance: alert, cooperative and appears stated age Head: Normocephalic, without obvious abnormality, atraumatic Neck: no adenopathy, supple, symmetrical, trachea midline and thyroid normal to inspection and palpation Lungs: clear to auscultation bilaterally Cardiovascular: regular rate and rhythm Breasts: normal appearance, no masses or tenderness, evidence of right lumpectomy Abdomen: soft, non-tender; non distended,  no masses,  no organomegaly Extremities: extremities normal, atraumatic, no cyanosis or edema Skin: Skin color, texture, turgor normal. No rashes or lesions Lymph nodes: Cervical, supraclavicular, and axillary nodes normal. No abnormal inguinal nodes palpated Neurologic: Grossly normal   Pelvic: External genitalia:  no lesions              Urethra:  normal appearing urethra with no masses, tenderness or lesions              Bartholins and Skenes: normal                 Vagina: normal appearing vagina with normal color and discharge, no lesions              Cervix: absent               Bimanual Exam:  Uterus:  uterus absent              Adnexa: no mass, fullness, tenderness               Rectovaginal: Confirms               Anus:  normal sphincter tone, no lesions  Gae Dry chaperoned for the exam.  1. Well woman exam Discussed breast self exam Discussed calcium and vit D intake Labs with primary No pap needed Mammogram in  10/23 Colonoscopy is UTD

## 2021-11-05 ENCOUNTER — Encounter: Payer: Self-pay | Admitting: Obstetrics and Gynecology

## 2021-11-05 ENCOUNTER — Ambulatory Visit (INDEPENDENT_AMBULATORY_CARE_PROVIDER_SITE_OTHER): Payer: BC Managed Care – PPO | Admitting: Obstetrics and Gynecology

## 2021-11-05 VITALS — BP 122/80 | HR 83 | Ht 61.5 in | Wt 137.0 lb

## 2021-11-05 DIAGNOSIS — Z01419 Encounter for gynecological examination (general) (routine) without abnormal findings: Secondary | ICD-10-CM

## 2021-11-05 NOTE — Patient Instructions (Signed)

## 2021-12-25 NOTE — Progress Notes (Signed)
Office Visit Note  Patient: Anna Navarro             Date of Birth: [birth year]           MRN: [id number]             PCP: Maury Dus, MD Referring: Maury Dus, MD Visit Date: 01/07/2022 Occupation: @GUAROCC @  Subjective:  Other (Right thumb pain and swelling )   History of Present Illness: Anna Navarro is a 66 y.o. female history of osteoarthritis and osteopenia.  She states that she has been having pain and discomfort in her right thumb.  She has been massaging her thumb which gives her some relief.  She has some stiffness in her other fingers but not as much discomfort.  Right knee joint pain is manageable.  She has no discomfort in her left knee joint which is replaced.  She has off-and-on discomfort in her lower back which is doing quite well currently.  She is off Actonel and will be getting repeat DEXA scan this year.  Activities of Daily Living:  Patient reports morning stiffness for 0 minutes.   Patient Denies nocturnal pain.  Difficulty dressing/grooming: Denies Difficulty climbing stairs: Denies Difficulty getting out of chair: Denies Difficulty using hands for taps, buttons, cutlery, and/or writing: Reports  Review of Systems  Constitutional:  Negative for fatigue.  HENT:  Negative for mouth sores and mouth dryness.   Eyes:  Negative for dryness.  Respiratory:  Negative for shortness of breath.   Cardiovascular:  Negative for chest pain and palpitations.  Gastrointestinal:  Negative for blood in stool, constipation and diarrhea.  Endocrine: Negative for increased urination.  Genitourinary:  Negative for involuntary urination.  Musculoskeletal:  Positive for joint pain, joint pain and joint swelling. Negative for gait problem, myalgias, muscle weakness, morning stiffness, muscle tenderness and myalgias.  Skin:  Negative for color change, rash, hair loss and sensitivity to sunlight.  Allergic/Immunologic: Negative for susceptible to infections.   Neurological:  Negative for dizziness and headaches.  Hematological:  Negative for swollen glands.  Psychiatric/Behavioral:  Positive for sleep disturbance. Negative for depressed mood. The patient is not nervous/anxious.     PMFS History:  Patient Active Problem List   Diagnosis Date Noted   Daytime somnolence 09/02/2020   Major depression in partial remission ([place]) 09/02/2020   Obstructive sleep apnea syndrome 09/02/2020   Peripheral neuropathy 09/02/2020   Sleep disorder 09/02/2020   Vitamin D deficiency 09/02/2020   Elevated blood-pressure reading, without diagnosis of hypertension 08/02/2019   Muscle weakness of upper extremity 08/02/2019   Osteopenia of multiple sites 06/29/2018   Primary osteoarthritis of both hands 06/29/2018   Primary osteoarthritis of both knees 06/29/2018   DDD (degenerative disc disease), lumbar 06/29/2018   Status post gastric bypass for obesity 06/29/2018   Genetic testing 01/28/2015   Rhinitis, allergic 11/02/2014   Hyperlipemia 11/02/2014   Vestibular migraine 10/29/2014   Spondylolisthesis at L5-S1 level 09/06/2014   History of breast cancer 08/18/2012    Past Medical History:  Diagnosis Date   Allergic rhinitis 02/03/2007   Breast cancer ([place]) 2009   IDC of UOQ of right breast; ER/PR+, Her2-, Ki67 10%; s/p lumpectomy and radiation 2009   Chronic eustachian tube dysfunction 05/23/2010   DDD (degenerative disc disease), lumbar    seeing Dr. Ellene Route   Depression 02/03/2007   GERD 11/12/2006   H/O iron deficiency anemia 05/09/2007   per pt from grastic bypass   Hyperlipemia 11/12/2006  Low back pain 02/03/2007   Neck mass 01/03/2007   Osteoporosis    Personal history of radiation therapy 2009   UTI (lower urinary tract infection) 11/12/2006   Vestibular neuritis    Warts 01/07/2010   hands    Family History  Problem Relation Age of Onset   Other Mother        bilateral mastectomies at [facility] due to precancerous tumors   Breast cancer Mother     Healthy Mother    Heart attack Father    Mental illness Sister    Lung cancer Sister    Breast cancer Maternal Aunt        dx. early 36s   Heart Problems Maternal Aunt    Lung cancer Maternal Uncle        dx. 62-62; former smoker   Lung cancer Maternal Grandmother        dx. 52s; not a smoker; "metastasis to colon"   Healthy Daughter    Healthy Son    Healthy Son    Brain cancer Other        dx. >50s   Cancer Other        dx. <50s; unknown cancer/dx. 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      Home Situation: lives with husband      Spiritual Beliefs: Jewish      Lifestyle: regular exercise 2-4 days per week; trying to eat healthy   Right handed      Immunization History  Administered Date(s) Administered   Influenza, Quadrivalent, Recombinant, Inj, Pf 01/12/2019   PFIZER(Purple Top)SARS-COV-2 Vaccination 07/14/2019, 08/04/2019, 03/01/2020   Td 05/18/1993, 01/12/2019     Objective: Vital Signs: BP 127/74 (BP Location: Left Arm, Patient Position: Sitting, Cuff Size: Normal)   Pulse 68   Resp 15   Ht 5' 1.5" (1.562 m)   Wt 137 lb (62.1 kg)   BMI 25.47 kg/m    Physical Exam Vitals and nursing note reviewed.  Constitutional:      Appearance: She is well-developed.  HENT:     Head: Normocephalic and atraumatic.  Eyes:     Conjunctiva/sclera: Conjunctivae normal.  Cardiovascular:     Rate and Rhythm: Normal rate and regular rhythm.     Heart sounds: Normal heart sounds.  Pulmonary:     Effort: Pulmonary  effort is normal.     Breath sounds: Normal breath sounds.  Abdominal:     General: Bowel sounds are normal.     Palpations: Abdomen is soft.  Musculoskeletal:     Cervical back: Normal range of motion.  Lymphadenopathy:     Cervical: No cervical adenopathy.  Skin:    General: Skin is warm and dry.     Capillary Refill: Capillary refill takes less than 2 seconds.  Neurological:     Mental Status: She is alert and oriented to person, place, and time.  Psychiatric:        Behavior: Behavior normal.  Musculoskeletal Exam: Cervical spine, thoracic and lumbar spine were in good range of motion.  Shoulder joints, elbow joints, wrist joints, MCPs PIPs and DIPs with good range of motion with no synovitis.  She had subluxation of her right thumb PIP joint which causes discomfort.  No swelling was noted.  Hip joints and knee joints with good range of motion.  Left knee joint was replaced.  There was no tenderness over ankles or MTPs.  CDAI Exam: CDAI Score: -- Patient Global: --; Provider Global: -- Swollen: --; Tender: -- Joint Exam 01/07/2022   No joint exam has been documented for this visit   There is currently no information documented on the homunculus. Go to the Rheumatology activity and complete the homunculus joint exam.  Investigation: No additional findings.  Imaging: No results found.  Recent Labs: Lab Results  Component Value Date   WBC 5.7 07/13/2019   HGB 13.9 07/13/2019   PLT 257 07/13/2019   NA 142 07/13/2019   K 4.2 07/13/2019   CL 108 07/13/2019   CO2 29 07/13/2019   GLUCOSE 90 07/13/2019   BUN 21 07/13/2019   CREATININE 0.73 07/13/2019   BILITOT 0.3 07/13/2019   ALKPHOS 51 07/18/2013   AST 18 07/13/2019   ALT 20 07/13/2019   PROT 6.3 07/13/2019   ALBUMIN 3.8 07/18/2013   CALCIUM 9.4 07/13/2019   GFRAA 101 07/13/2019    Speciality Comments: No specialty comments available.  Procedures:  No procedures performed Allergies: Patient has no  known allergies.   Assessment / Plan:     Visit Diagnoses: Osteopenia of multiple sites - 03/28/20 DEXA:  The BMD measured at Femur Total Left is 0.734 g/cm2 with a T-score of -2.2. Previous DEXA on Oct 01, 2017 T score of -2.4.  She is on drug holiday.  She was on Actonel in the past.  She will get DEXA scan later this year.She is currently not taking any medications.  Use of calcium rich diet with vitamin D was discussed.  Resistive exercises were discussed.  Medication monitoring encounter - Previously taking Actonel 150 mg monthly (for about 1 year), but she discontinued due to experiencing severe arthralgias.    Primary osteoarthritis of both hands-right thumb subluxation was noted.  No swelling or synovitis was noted.  Joint protection muscle strengthening was discussed.  A handout on exercises was given.  She will be referred to hand rehab center.  I will also refer her for ring splint.  I advised her to contact me if she does not get any relief.  Use of topical Voltaren gel was also discussed.  Primary osteoarthritis of right knee-she good range of motion without any warmth swelling or effusion.  History of total knee arthroplasty, left - Performed by Dr. Noemi Chapel 12/21.  Doing well.  DDD (degenerative disc disease), lumbar-she had mobility without discomfort.  Other medical problems are listed as follows:  Vestibular migraine  Mixed hyperlipidemia  History of gastroesophageal reflux (GERD)  History of breast cancer  Status post gastric bypass for obesity  Orders: Orders Placed This Encounter  Procedures   Ambulatory referral to Hand Surgery   No orders of the defined types were placed in this encounter.    Follow-Up Instructions: Return in about 1 year (around 01/08/2023) for Osteoarthritis.   Bo Merino, MD  Note - This record has been created using Editor, commissioning.  Chart creation errors have been sought, but may not always  have been located. Such creation  errors do not reflect  on  the standard of medical care.

## 2022-01-02 DIAGNOSIS — Z0000 Encounter for general adult medical examination without abnormal findings: Secondary | ICD-10-CM | POA: Diagnosis not present

## 2022-01-02 DIAGNOSIS — Z7185 Encounter for immunization safety counseling: Secondary | ICD-10-CM | POA: Diagnosis not present

## 2022-01-02 DIAGNOSIS — Z79899 Other long term (current) drug therapy: Secondary | ICD-10-CM | POA: Diagnosis not present

## 2022-01-02 DIAGNOSIS — G629 Polyneuropathy, unspecified: Secondary | ICD-10-CM | POA: Diagnosis not present

## 2022-01-02 DIAGNOSIS — E785 Hyperlipidemia, unspecified: Secondary | ICD-10-CM | POA: Diagnosis not present

## 2022-01-02 DIAGNOSIS — F324 Major depressive disorder, single episode, in partial remission: Secondary | ICD-10-CM | POA: Diagnosis not present

## 2022-01-02 DIAGNOSIS — Z9884 Bariatric surgery status: Secondary | ICD-10-CM | POA: Diagnosis not present

## 2022-01-02 DIAGNOSIS — E559 Vitamin D deficiency, unspecified: Secondary | ICD-10-CM | POA: Diagnosis not present

## 2022-01-07 ENCOUNTER — Encounter: Payer: Self-pay | Admitting: Rheumatology

## 2022-01-07 ENCOUNTER — Ambulatory Visit: Payer: BC Managed Care – PPO | Attending: Rheumatology | Admitting: Rheumatology

## 2022-01-07 VITALS — BP 127/74 | HR 68 | Resp 15 | Ht 61.5 in | Wt 137.0 lb

## 2022-01-07 DIAGNOSIS — Z5181 Encounter for therapeutic drug level monitoring: Secondary | ICD-10-CM | POA: Diagnosis not present

## 2022-01-07 DIAGNOSIS — M8589 Other specified disorders of bone density and structure, multiple sites: Secondary | ICD-10-CM | POA: Diagnosis not present

## 2022-01-07 DIAGNOSIS — G43809 Other migraine, not intractable, without status migrainosus: Secondary | ICD-10-CM

## 2022-01-07 DIAGNOSIS — Z96652 Presence of left artificial knee joint: Secondary | ICD-10-CM

## 2022-01-07 DIAGNOSIS — E782 Mixed hyperlipidemia: Secondary | ICD-10-CM

## 2022-01-07 DIAGNOSIS — Z9884 Bariatric surgery status: Secondary | ICD-10-CM | POA: Diagnosis not present

## 2022-01-07 DIAGNOSIS — M5136 Other intervertebral disc degeneration, lumbar region: Secondary | ICD-10-CM | POA: Diagnosis not present

## 2022-01-07 DIAGNOSIS — M19042 Primary osteoarthritis, left hand: Secondary | ICD-10-CM | POA: Diagnosis not present

## 2022-01-07 DIAGNOSIS — Z8719 Personal history of other diseases of the digestive system: Secondary | ICD-10-CM

## 2022-01-07 DIAGNOSIS — Z853 Personal history of malignant neoplasm of breast: Secondary | ICD-10-CM | POA: Diagnosis not present

## 2022-01-07 DIAGNOSIS — M19041 Primary osteoarthritis, right hand: Secondary | ICD-10-CM

## 2022-01-07 DIAGNOSIS — M1711 Unilateral primary osteoarthritis, right knee: Secondary | ICD-10-CM

## 2022-01-07 DIAGNOSIS — M51369 Other intervertebral disc degeneration, lumbar region without mention of lumbar back pain or lower extremity pain: Secondary | ICD-10-CM

## 2022-01-07 NOTE — Patient Instructions (Signed)
Hand Exercises Hand exercises can be helpful for almost anyone. These exercises can strengthen the hands, improve flexibility and movement, and increase blood flow to the hands. These results can make work and daily tasks easier. Hand exercises can be especially helpful for people who have joint pain from arthritis or have nerve damage from overuse (carpal tunnel syndrome). These exercises can also help people who have injured a hand. Exercises Most of these hand exercises are gentle stretching and motion exercises. It is usually safe to do them often throughout the day. Warming up your hands before exercise may help to reduce stiffness. You can do this with gentle massage or by placing your hands in warm water for 10-15 minutes. It is normal to feel some stretching, pulling, tightness, or mild discomfort as you begin new exercises. This will gradually improve. Stop an exercise right away if you feel sudden, severe pain or your pain gets worse. Ask your health care provider which exercises are best for you. Knuckle bend or "claw" fist  Stand or sit with your arm, hand, and all five fingers pointed straight up. Make sure to keep your wrist straight during the exercise. Gently bend your fingers down toward your palm until the tips of your fingers are touching the top of your palm. Keep your big knuckle straight and just bend the small knuckles in your fingers. Hold this position for __________ seconds. Straighten (extend) your fingers back to the starting position. Repeat this exercise 5-10 times with each hand. Full finger fist  Stand or sit with your arm, hand, and all five fingers pointed straight up. Make sure to keep your wrist straight during the exercise. Gently bend your fingers into your palm until the tips of your fingers are touching the middle of your palm. Hold this position for __________ seconds. Extend your fingers back to the starting position, stretching every joint fully. Repeat  this exercise 5-10 times with each hand. Straight fist Stand or sit with your arm, hand, and all five fingers pointed straight up. Make sure to keep your wrist straight during the exercise. Gently bend your fingers at the big knuckle, where your fingers meet your hand, and the middle knuckle. Keep the knuckle at the tips of your fingers straight and try to touch the bottom of your palm. Hold this position for __________ seconds. Extend your fingers back to the starting position, stretching every joint fully. Repeat this exercise 5-10 times with each hand. Tabletop  Stand or sit with your arm, hand, and all five fingers pointed straight up. Make sure to keep your wrist straight during the exercise. Gently bend your fingers at the big knuckle, where your fingers meet your hand, as far down as you can while keeping the small knuckles in your fingers straight. Think of forming a tabletop with your fingers. Hold this position for __________ seconds. Extend your fingers back to the starting position, stretching every joint fully. Repeat this exercise 5-10 times with each hand. Finger spread  Place your hand flat on a table with your palm facing down. Make sure your wrist stays straight as you do this exercise. Spread your fingers and thumb apart from each other as far as you can until you feel a gentle stretch. Hold this position for __________ seconds. Bring your fingers and thumb tight together again. Hold this position for __________ seconds. Repeat this exercise 5-10 times with each hand. Making circles  Stand or sit with your arm, hand, and all five fingers pointed   straight up. Make sure to keep your wrist straight during the exercise. Make a circle by touching the tip of your thumb to the tip of your index finger. Hold for __________ seconds. Then open your hand wide. Repeat this motion with your thumb and each finger on your hand. Repeat this exercise 5-10 times with each hand. Thumb  motion  Sit with your forearm resting on a table and your wrist straight. Your thumb should be facing up toward the ceiling. Keep your fingers relaxed as you move your thumb. Lift your thumb up as high as you can toward the ceiling. Hold for __________ seconds. Bend your thumb across your palm as far as you can, reaching the tip of your thumb for the small finger (pinkie) side of your palm. Hold for __________ seconds. Repeat this exercise 5-10 times with each hand. Grip strengthening  Hold a stress ball or other soft ball in the middle of your hand. Slowly increase the pressure, squeezing the ball as much as you can without causing pain. Think of bringing the tips of your fingers into the middle of your palm. All of your finger joints should bend when doing this exercise. Hold your squeeze for __________ seconds, then relax. Repeat this exercise 5-10 times with each hand. Contact a health care provider if: Your hand pain or discomfort gets much worse when you do an exercise. Your hand pain or discomfort does not improve within 2 hours after you exercise. If you have any of these problems, stop doing these exercises right away. Do not do them again unless your health care provider says that you can. Get help right away if: You develop sudden, severe hand pain or swelling. If this happens, stop doing these exercises right away. Do not do them again unless your health care provider says that you can. This information is not intended to replace advice given to you by your health care provider. Make sure you discuss any questions you have with your health care provider. Document Revised: 08/22/2020 Document Reviewed: 08/22/2020 Elsevier Patient Education  2023 [employer]  

## 2022-01-13 DIAGNOSIS — M180 Bilateral primary osteoarthritis of first carpometacarpal joints: Secondary | ICD-10-CM | POA: Diagnosis not present

## 2022-01-14 ENCOUNTER — Other Ambulatory Visit: Payer: Self-pay | Admitting: Family Medicine

## 2022-01-14 DIAGNOSIS — Z1231 Encounter for screening mammogram for malignant neoplasm of breast: Secondary | ICD-10-CM

## 2022-01-21 DIAGNOSIS — M180 Bilateral primary osteoarthritis of first carpometacarpal joints: Secondary | ICD-10-CM | POA: Diagnosis not present

## 2022-01-21 DIAGNOSIS — M25541 Pain in joints of right hand: Secondary | ICD-10-CM | POA: Diagnosis not present

## 2022-01-21 DIAGNOSIS — M25641 Stiffness of right hand, not elsewhere classified: Secondary | ICD-10-CM | POA: Diagnosis not present

## 2022-01-26 DIAGNOSIS — M25541 Pain in joints of right hand: Secondary | ICD-10-CM | POA: Diagnosis not present

## 2022-01-26 DIAGNOSIS — M25641 Stiffness of right hand, not elsewhere classified: Secondary | ICD-10-CM | POA: Diagnosis not present

## 2022-01-26 DIAGNOSIS — M180 Bilateral primary osteoarthritis of first carpometacarpal joints: Secondary | ICD-10-CM | POA: Diagnosis not present

## 2022-02-02 DIAGNOSIS — M180 Bilateral primary osteoarthritis of first carpometacarpal joints: Secondary | ICD-10-CM | POA: Diagnosis not present

## 2022-02-02 DIAGNOSIS — M25641 Stiffness of right hand, not elsewhere classified: Secondary | ICD-10-CM | POA: Diagnosis not present

## 2022-02-02 DIAGNOSIS — M25541 Pain in joints of right hand: Secondary | ICD-10-CM | POA: Diagnosis not present

## 2022-02-12 DIAGNOSIS — H25013 Cortical age-related cataract, bilateral: Secondary | ICD-10-CM | POA: Diagnosis not present

## 2022-02-12 DIAGNOSIS — H2513 Age-related nuclear cataract, bilateral: Secondary | ICD-10-CM | POA: Diagnosis not present

## 2022-02-12 DIAGNOSIS — H2512 Age-related nuclear cataract, left eye: Secondary | ICD-10-CM | POA: Diagnosis not present

## 2022-02-12 DIAGNOSIS — H18413 Arcus senilis, bilateral: Secondary | ICD-10-CM | POA: Diagnosis not present

## 2022-02-12 DIAGNOSIS — H25043 Posterior subcapsular polar age-related cataract, bilateral: Secondary | ICD-10-CM | POA: Diagnosis not present

## 2022-02-16 DIAGNOSIS — M25641 Stiffness of right hand, not elsewhere classified: Secondary | ICD-10-CM | POA: Diagnosis not present

## 2022-02-16 DIAGNOSIS — M25541 Pain in joints of right hand: Secondary | ICD-10-CM | POA: Diagnosis not present

## 2022-02-16 DIAGNOSIS — M180 Bilateral primary osteoarthritis of first carpometacarpal joints: Secondary | ICD-10-CM | POA: Diagnosis not present

## 2022-02-20 NOTE — Progress Notes (Signed)
NEUROLOGY FOLLOW UP OFFICE NOTE  Elisavet Buehrer [id number]  Assessment/Plan:   Vestibular migraine   Migraine prevention:  topiramate 228m at bedtime Migraine rescue:  diazepam and/or meclizine for vertigo, naproxen for headache Limit use of pain relievers to no more than 2 days out of week to prevent risk of rebound or medication-overuse headache. Keep headache diary Follow up one year     Subjective:  CDoloris Servantesis a 66year old right-handed woman with depression and history of breast cancer status post lumpectomy and radiation in 2009 who follows up for vestibular migraine.   UPDATE:  Infrequent but slight increase frequency over past year due to increased stress..  On average, 1 every other month.  Lasts a few hours. Rescue protocol:  Diazepam 2.55m(less if taken at work due to concerns of drowsiness) and sometimes meclizine Current NSAIDS:  Naproxen 50066murrent analgesics:  no Current triptans:  no Current ergotamine:  no Current anti-emetic:  no Current muscle relaxants:  no Current anti-anxiolytic:  Diazepam 2.5mg54mor acute attacks of vertigo) Current sleep aide:  no Current Antihypertensive medications:  no Current Antidepressant medications:  Pristiq Current Anticonvulsant medications:  topiramate 200mg74mbedtime; gabapentin 300mg 69medtime Current anti-CGRP:  no Current Vitamins/Herbal/Supplements:  B12 Current Antihistamines/Decongestants:  meclizine Other therapy:  no Hormone/birth control:  Estradiol   Caffeine:  Very little.  No coffee Alcohol:  2 glasses of wine per week Smoker:  no Diet:  A few glasses of water Exercise:  yes Depression:  no; Anxiety:  no Other pain:  left knee.  Needs TKR. Sleep hygiene:  Good   12/28/2019 LABS:  CBC with WBC 5, HGB 14.2, HCT 41.6, PLT 231; CMP with Na 144, K 4.2, Cl 108, CO2 29, BUN 18, Cr 0.89, glucose 101.   HISTORY: She began having recurrent episodes of dizziness in March 2015.  She  describes it as a sensation of movement, like during a drop on a roller coaster, as well as spinning.  It occurs spontaneously and at rest, but can be aggravated with movement.  Sometimes it is so severe, she was unable to drive and needed her husband to come and pick her up.  She may feel a little quesy, but she denies any significant nausea and no vomiting.  She endorses photophobia, phonophobia and osmophobia.  She denies lightheadedness, vision loss, focal numbness or weakness, or slurred speech. It is associated with a dull, 6/10 non-throbbing holocephalic ache.  There are no known triggers.  Only rest and sleep help.  Initially, at time of her first visit, t typically lasts 2-3 days and occurred once a week to once every other week.  There are no specific relieving factors.  She has taken meclizine, which is not too effective.  Even though the spells are episodic, she didn't quite feel right in between spells.   She was evaluated by Dr. Kraus Thornell [facility] showed central findings.  She had an MRI of the brain with and without contrast, which was reportedly normal.   She denies preceding viral illness or head trauma.  She denies prior history of migraine.  She denies family history of migraine.  PAST MEDICAL HISTORY: Past Medical History:  Diagnosis Date   Allergic rhinitis 02/03/2007   Breast cancer (HCC) [place]   IDC of UOQ of right breast; ER/PR+, Her2-, Ki67 10%; s/p lumpectomy and radiation 2009   Chronic eustachian tube dysfunction 05/23/2010   DDD (degenerative  disc disease), lumbar    seeing Dr. Ellene Route   Depression 02/03/2007   GERD 11/12/2006   H/O iron deficiency anemia 05/09/2007   per pt from grastic bypass   Hyperlipemia 11/12/2006   Low back pain 02/03/2007   Neck mass 01/03/2007   Osteoporosis    Personal history of radiation therapy 2009   UTI (lower urinary tract infection) 11/12/2006   Vestibular neuritis    Warts 01/07/2010   hands     MEDICATIONS: Current Outpatient Medications on File Prior to Visit  Medication Sig Dispense Refill   AZO-CRANBERRY PO Take by mouth daily.     Cholecalciferol (VITAMIN D3) 5000 UNITS TABS Take 1 capsule by mouth daily. 30 tablet    D-MANNOSE PO Take by mouth daily.     desvenlafaxine (PRISTIQ) 50 MG 24 hr tablet Take 1 tablet (50 mg total) by mouth daily. 90 tablet 3   diazepam (VALIUM) 5 MG tablet Take 0.5 tab every 6 hours as needed for dizziness.  Caution for drowsiness. (Patient not taking: Reported on 01/07/2022) 15 tablet 2   gabapentin (NEURONTIN) 100 MG capsule take 1 capsule at night (Patient taking differently: Take 300 mg by mouth daily.) 90 capsule 3   meclizine (ANTIVERT) 25 MG tablet Take 1 tablet (25 mg total) by mouth 3 (three) times daily as needed for dizziness. 30 tablet 5   omeprazole (PRILOSEC) 20 MG capsule Take 20 mg by mouth daily.     topiramate (TOPAMAX) 100 MG tablet Take 2 tablets (200 mg total) by mouth at bedtime. 180 tablet 3   vitamin B-12 (CYANOCOBALAMIN) 250 MCG tablet Take 250 mcg by mouth daily.     No current facility-administered medications on file prior to visit.    ALLERGIES: No Known Allergies  FAMILY HISTORY: Family History  Problem Relation Age of Onset   Other Mother        bilateral mastectomies at 55y due to precancerous tumors   Breast cancer Mother    Healthy Mother    Heart attack Father    Mental illness Sister    Lung cancer Sister    Breast cancer Maternal Aunt        dx. early 12s   Heart Problems Maternal Aunt    Lung cancer Maternal Uncle        dx. 62-62; former smoker   Lung cancer Maternal Grandmother        dx. 18s; not a smoker; "metastasis to colon"   Healthy Daughter    Healthy Son    Healthy Son    Brain cancer Other        dx. >50s   Cancer Other        dx. <50s; unknown cancer/dx. 30s; unknown type      Objective:  Blood pressure 128/70, pulse 73, height 5' 1"  (1.549 m), weight 134 lb 12.8 oz (61.1  kg), SpO2 100 %. General: No acute distress.  Patient appears well-groomed.   Head:  Normocephalic/atraumatic Eyes:  Fundi examined but not visualized Neck: supple, no paraspinal tenderness, full range of motion Heart:  Regular rate and rhythm Neurological Exam: alert and oriented to person, place, and time.  Speech fluent and not dysarthric, language intact.  CN II-XII intact. Bulk and tone normal, muscle strength 5/5 throughout.  Sensation to light touch intact.  Deep tendon reflexes 2+ throughout, toes downgoing.  Finger to nose testing intact.  Gait normal, Romberg negative.   Metta Clines, DO  CC: Caren Macadam, MD

## 2022-02-24 ENCOUNTER — Encounter: Payer: Self-pay | Admitting: Neurology

## 2022-02-24 ENCOUNTER — Ambulatory Visit (INDEPENDENT_AMBULATORY_CARE_PROVIDER_SITE_OTHER): Payer: PPO | Admitting: Neurology

## 2022-02-24 VITALS — BP 128/70 | HR 73 | Ht 61.0 in | Wt 134.8 lb

## 2022-02-24 DIAGNOSIS — G43809 Other migraine, not intractable, without status migrainosus: Secondary | ICD-10-CM

## 2022-02-24 MED ORDER — TOPIRAMATE 100 MG PO TABS: 200.0000 mg | ORAL_TABLET | Freq: Every day | ORAL | 3 refills | Status: DC

## 2022-02-27 DIAGNOSIS — M25641 Stiffness of right hand, not elsewhere classified: Secondary | ICD-10-CM | POA: Diagnosis not present

## 2022-02-27 DIAGNOSIS — M25541 Pain in joints of right hand: Secondary | ICD-10-CM | POA: Diagnosis not present

## 2022-02-27 DIAGNOSIS — M180 Bilateral primary osteoarthritis of first carpometacarpal joints: Secondary | ICD-10-CM | POA: Diagnosis not present

## 2022-03-02 ENCOUNTER — Ambulatory Visit
Admission: RE | Admit: 2022-03-02 | Discharge: 2022-03-02 | Disposition: A | Discharge status: Home / Self Care | Payer: PPO | Source: Ambulatory Visit | Attending: Family Medicine | Admitting: Family Medicine

## 2022-03-02 DIAGNOSIS — Z1231 Encounter for screening mammogram for malignant neoplasm of breast: Secondary | ICD-10-CM

## 2022-03-09 DIAGNOSIS — M1712 Unilateral primary osteoarthritis, left knee: Secondary | ICD-10-CM | POA: Diagnosis not present

## 2022-04-13 DIAGNOSIS — J020 Streptococcal pharyngitis: Secondary | ICD-10-CM | POA: Diagnosis not present

## 2022-04-13 DIAGNOSIS — R051 Acute cough: Secondary | ICD-10-CM | POA: Diagnosis not present

## 2022-04-29 DIAGNOSIS — H2512 Age-related nuclear cataract, left eye: Secondary | ICD-10-CM | POA: Diagnosis not present

## 2022-04-30 DIAGNOSIS — H2511 Age-related nuclear cataract, right eye: Secondary | ICD-10-CM | POA: Diagnosis not present

## 2022-05-27 DIAGNOSIS — H2511 Age-related nuclear cataract, right eye: Secondary | ICD-10-CM | POA: Diagnosis not present

## 2022-07-28 DIAGNOSIS — K219 Gastro-esophageal reflux disease without esophagitis: Secondary | ICD-10-CM | POA: Diagnosis not present

## 2022-07-28 DIAGNOSIS — F324 Major depressive disorder, single episode, in partial remission: Secondary | ICD-10-CM | POA: Diagnosis not present

## 2022-07-28 DIAGNOSIS — E559 Vitamin D deficiency, unspecified: Secondary | ICD-10-CM | POA: Diagnosis not present

## 2022-07-28 DIAGNOSIS — Z9884 Bariatric surgery status: Secondary | ICD-10-CM | POA: Diagnosis not present

## 2022-08-31 DIAGNOSIS — C50911 Malignant neoplasm of unspecified site of right female breast: Secondary | ICD-10-CM | POA: Diagnosis not present

## 2022-09-10 DIAGNOSIS — C50911 Malignant neoplasm of unspecified site of right female breast: Secondary | ICD-10-CM | POA: Diagnosis not present

## 2022-09-15 DIAGNOSIS — L578 Other skin changes due to chronic exposure to nonionizing radiation: Secondary | ICD-10-CM | POA: Diagnosis not present

## 2022-09-15 DIAGNOSIS — L821 Other seborrheic keratosis: Secondary | ICD-10-CM | POA: Diagnosis not present

## 2022-09-15 DIAGNOSIS — L814 Other melanin hyperpigmentation: Secondary | ICD-10-CM | POA: Diagnosis not present

## 2022-09-15 DIAGNOSIS — L720 Epidermal cyst: Secondary | ICD-10-CM | POA: Diagnosis not present

## 2022-09-15 DIAGNOSIS — D229 Melanocytic nevi, unspecified: Secondary | ICD-10-CM | POA: Diagnosis not present

## 2022-09-29 DIAGNOSIS — C50911 Malignant neoplasm of unspecified site of right female breast: Secondary | ICD-10-CM | POA: Diagnosis not present

## 2022-11-09 NOTE — Progress Notes (Unsigned)
67 y.o. G68P1102 Married Declined female here for annual exam.   Sexually active, no pain.    H/O right breast cancer in 2009   H/O hysterectomy   H/O osteopenia with elevated risk of fracture. She hasn't tolerated biphosphonates or Evista. Followed by her Rheumatology.  Mom died in Aug 19, 2022.   No LMP recorded. Patient has had a hysterectomy.          Sexually active: Yes.    The current method of family planning is status post hysterectomy.    Exercising: Yes.     Walking  Smoker:  no  Health Maintenance: Pap:  2006 History of abnormal Pap:  no MMG:  03/02/22 density B Bi-rads 1 neg BMD:   03/28/20 osteopenic  T-score -2.2, FRAX not calculated secondary to prior use of Evista. Dr Corliss Skains is following.  Colonoscopy: 06/23/21 colonoscopy normal. F/U 5 years, prior h/o polyp. TDaP:  02/2019 Gardasil: n/a  reports that she has never smoked. She has never been exposed to tobacco smoke. She has never used smokeless tobacco. She reports current alcohol use of about 2.0 standard drinks of alcohol per week. She reports that she does not use drugs. Retired Runner, broadcasting/film/video. 3 children, 1 grandson.   Past Medical History:  Diagnosis Date   Allergic rhinitis 02/03/2007   Breast cancer (HCC) 2009   IDC of UOQ of right breast; ER/PR+, Her2-, Ki67 10%; s/p lumpectomy and radiation 2009   Chronic eustachian tube dysfunction 05/23/2010   DDD (degenerative disc disease), lumbar    seeing Dr. Danielle Dess   Depression 02/03/2007   GERD 11/12/2006   H/O iron deficiency anemia 05/09/2007   per pt from grastic bypass   Hyperlipemia 11/12/2006   Low back pain 02/03/2007   Neck mass 01/03/2007   Osteoporosis    Personal history of radiation therapy 2009   UTI (lower urinary tract infection) 11/12/2006   Vestibular neuritis    Warts 01/07/2010   hands    Past Surgical History:  Procedure Laterality Date   BREAST BIOPSY Right 2009   High Risk Stereo Biopsy    BREAST LUMPECTOMY Right 2009   BREAST LUMPECTOMY WITH  AXILLARY LYMPH NODE BIOPSY Right 08/2007   RT, ER/PR+, T1N0   GASTRIC BYPASS  01/25/2006   Roux-en-Y gastric bypass   KNEE SURGERY Left    TOTAL ABDOMINAL HYSTERECTOMY     TOTAL KNEE ARTHROPLASTY Left 04/17/2020    Current Outpatient Medications  Medication Sig Dispense Refill   AZO-CRANBERRY PO Take by mouth daily.     Cholecalciferol (VITAMIN D3) 5000 UNITS TABS Take 1 capsule by mouth daily. 30 tablet    D-MANNOSE PO Take by mouth daily.     desvenlafaxine (PRISTIQ) 50 MG 24 hr tablet Take 1 tablet (50 mg total) by mouth daily. 90 tablet 3   diazepam (VALIUM) 5 MG tablet Take 0.5 tab every 6 hours as needed for dizziness.  Caution for drowsiness. 15 tablet 2   gabapentin (NEURONTIN) 100 MG capsule take 1 capsule at night (Patient taking differently: Take 300 mg by mouth daily.) 90 capsule 3   meclizine (ANTIVERT) 25 MG tablet Take 1 tablet (25 mg total) by mouth 3 (three) times daily as needed for dizziness. 30 tablet 5   omeprazole (PRILOSEC) 20 MG capsule Take 20 mg by mouth daily.     topiramate (TOPAMAX) 100 MG tablet Take 2 tablets (200 mg total) by mouth at bedtime. 180 tablet 3   vitamin B-12 (CYANOCOBALAMIN) 250 MCG tablet Take 250 mcg by  mouth daily.     No current facility-administered medications for this visit.    Family History  Problem Relation Age of Onset   Other Mother        bilateral mastectomies at 45y due to precancerous tumors   Breast cancer Mother    Healthy Mother    Heart attack Father    Mental illness Sister    Lung cancer Sister    Breast cancer Maternal Aunt        dx. early 74s   Heart Problems Maternal Aunt    Lung cancer Maternal Uncle        dx. 62-62; former smoker   Lung cancer Maternal Grandmother        dx. 103s; not a smoker; "metastasis to colon"   Healthy Daughter    Healthy Son    Healthy Son    Brain cancer Other        dx. >50s   Cancer Other        dx. <50s; unknown cancer/dx. 30s; unknown type    Review of Systems  All  other systems reviewed and are negative.   Exam:   BP 116/74   Pulse 63   Ht 5' 1.5" (1.562 m)   Wt 130 lb (59 kg)   SpO2 100%   BMI 24.17 kg/m   Weight change: @WEIGHTCHANGE @ Height:   Height: 5' 1.5" (156.2 cm)  Ht Readings from Last 3 Encounters:  11/11/22 5' 1.5" (1.562 m)  02/24/22 5\' 1"  (1.549 m)  01/07/22 5' 1.5" (1.562 m)    General appearance: alert, cooperative and appears stated age Head: Normocephalic, without obvious abnormality, atraumatic Neck: no adenopathy, supple, symmetrical, trachea midline and thyroid normal to inspection and palpation Lungs: clear to auscultation bilaterally Cardiovascular: regular rate and rhythm Breasts: normal appearance, no masses or tenderness Abdomen: soft, non-tender; non distended,  no masses,  no organomegaly Extremities: extremities normal, atraumatic, no cyanosis or edema Skin: Skin color, texture, turgor normal. No rashes or lesions Lymph nodes: Cervical, supraclavicular, and axillary nodes normal. No abnormal inguinal nodes palpated Neurologic: Grossly normal   Pelvic: External genitalia:  no lesions              Urethra:  normal appearing urethra with no masses, tenderness or lesions              Bartholins and Skenes: normal                 Vagina: normal appearing vagina with normal color and discharge, no lesions              Cervix: absent               Bimanual Exam:  Uterus:  uterus absent              Adnexa: no mass, fullness, tenderness               Rectovaginal: Confirms               Anus:  normal sphincter tone, no lesions  Carolynn Serve, CMA chaperoned for the exam.  1. Well woman exam Discussed breast self exam Discussed calcium and vit D intake Labs with primary DEXA followed by Endocrinology Mammogram and colonoscopy UTD No pap needed

## 2022-11-11 ENCOUNTER — Ambulatory Visit (INDEPENDENT_AMBULATORY_CARE_PROVIDER_SITE_OTHER): Payer: PPO | Admitting: Obstetrics and Gynecology

## 2022-11-11 ENCOUNTER — Encounter: Payer: Self-pay | Admitting: Obstetrics and Gynecology

## 2022-11-11 VITALS — BP 116/74 | HR 63 | Ht 61.5 in | Wt 130.0 lb

## 2022-11-11 DIAGNOSIS — Z01419 Encounter for gynecological examination (general) (routine) without abnormal findings: Secondary | ICD-10-CM

## 2022-11-11 NOTE — Patient Instructions (Signed)

## 2022-12-27 NOTE — Progress Notes (Deleted)
Office Visit Note  Patient: Anna Navarro             Date of Birth: [birth year]           MRN: [id number]             PCP: Aliene Beams, MD Referring: Elias Else, MD Visit Date: 01/08/2023 Occupation: @GUAROCC @  Subjective:    History of Present Illness: Anna Navarro is a 67 y.o. female who returns today after her last visit on January 07, 2022.  Patient has history of osteoarthritis and DDD.     Activities of Daily Living:  Patient reports morning stiffness for *** {minute/hour:19697}.   Patient {ACTIONS;DENIES/REPORTS:21021675::"Denies"} nocturnal pain.  Difficulty dressing/grooming: {ACTIONS;DENIES/REPORTS:21021675::"Denies"} Difficulty climbing stairs: {ACTIONS;DENIES/REPORTS:21021675::"Denies"} Difficulty getting out of chair: {ACTIONS;DENIES/REPORTS:21021675::"Denies"} Difficulty using hands for taps, buttons, cutlery, and/or writing: {ACTIONS;DENIES/REPORTS:21021675::"Denies"}  No Rheumatology ROS completed.   PMFS History:  Patient Active Problem List   Diagnosis Date Noted  . Daytime somnolence 09/02/2020  . Major depression in partial remission (HCC) 09/02/2020  . Obstructive sleep apnea syndrome 09/02/2020  . Peripheral neuropathy 09/02/2020  . Sleep disorder 09/02/2020  . Vitamin D deficiency 09/02/2020  . Elevated blood-pressure reading, without diagnosis of hypertension 08/02/2019  . Muscle weakness of upper extremity 08/02/2019  . Osteopenia of multiple sites 06/29/2018  . Primary osteoarthritis of both hands 06/29/2018  . Primary osteoarthritis of both knees 06/29/2018  . DDD (degenerative disc disease), lumbar 06/29/2018  . Status post gastric bypass for obesity 06/29/2018  . Genetic testing 01/28/2015  . Rhinitis, allergic 11/02/2014  . Hyperlipemia 11/02/2014  . 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 Spondylolisthesis at L5-S1 level 09/06/2014  . History of breast cancer 08/18/2012    Past Medical History:  Diagnosis Date  .  Allergic rhinitis 02/03/2007  . Breast cancer (HCC) 2009   IDC of UOQ of right breast; ER/PR+, Her2-, Ki67 10%; s/p lumpectomy and radiation 2009  . Chronic eustachian tube dysfunction 05/23/2010  . DDD (degenerative disc disease), lumbar    seeing Dr. Danielle Dess  . Depression 02/03/2007  . GERD 11/12/2006  . H/O iron deficiency anemia 05/09/2007   per pt from grastic bypass  . Hyperlipemia 11/12/2006  . Low back pain 02/03/2007  . Neck mass 01/03/2007  . Osteoporosis   . Personal history of radiation therapy 2009  . UTI (lower urinary tract infection) 11/12/2006  . Vestibular neuritis   . Warts 01/07/2010   hands    Family History  Problem Relation Age of Onset  . Other Mother        bilateral mastectomies at 45y due to precancerous tumors  . Breast cancer Mother   . Healthy Mother   . Heart attack Father   . Mental illness Sister   . Lung cancer Sister   . Breast cancer Maternal Aunt        dx. early 57s  . Heart Problems Maternal Aunt   . Lung cancer Maternal Uncle        dx. 62-62; former smoker  . Lung cancer Maternal Grandmother        dx. 40s; not a smoker; "metastasis to colon"  . Healthy Daughter   . Healthy Son   . Healthy Son   . Brain cancer Other        dx. >50s  . Cancer Other        dx. <50s; unknown cancer/dx. 30s; unknown type   Past Surgical History:  Procedure Laterality Date  . BREAST BIOPSY Right 2009  High Risk Stereo Biopsy   . BREAST LUMPECTOMY Right 2009  . BREAST LUMPECTOMY WITH AXILLARY LYMPH NODE BIOPSY Right 08/2007   RT, ER/PR+, T1N0  . 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 KNEE SURGERY Left   . TOTAL ABDOMINAL HYSTERECTOMY    . 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 Influenza, Quadrivalent, Recombinant, Inj, Pf 01/12/2019  . PFIZER(Purple Top)SARS-COV-2 Vaccination 07/14/2019, 08/04/2019, 03/01/2020  . Td 05/18/1993, 01/12/2019     Objective: Vital Signs: There were no vitals taken for this visit.   Physical Exam Vitals and nursing note reviewed.  Constitutional:      Appearance: She is well-developed.  HENT:     Head: Normocephalic and atraumatic.  Eyes:     Conjunctiva/sclera: Conjunctivae normal.  Cardiovascular:     Rate and Rhythm: Normal rate and regular rhythm.     Heart sounds: Normal heart sounds.  Pulmonary:     Effort: Pulmonary effort is normal.     Breath sounds: Normal breath sounds.  Abdominal:     General: Bowel sounds are normal.     Palpations: Abdomen is soft.  Musculoskeletal:     Cervical back: Normal range of motion.  Lymphadenopathy:     Cervical: No cervical adenopathy.  Skin:    General: Skin is warm and dry.     Capillary Refill: Capillary refill takes less than 2 seconds.  Neurological:     Mental Status: She is alert and oriented to person, place, and time.  Psychiatric:        Behavior: Behavior normal.     Musculoskeletal Exam: ***  CDAI Exam: CDAI Score: -- Patient Global: --; Provider Global: -- Swollen: --; Tender: -- Joint Exam 01/08/2023   No joint exam has been documented for this visit   There is currently no information documented on the homunculus. Go to the Rheumatology activity and complete the homunculus joint exam.  Investigation: No additional findings.  Imaging: No results found.  Recent Labs: Lab Results  Component Value Date   WBC 5.7 07/13/2019   HGB 13.9 07/13/2019   PLT 257 07/13/2019   NA 142 07/13/2019   K 4.2 07/13/2019   CL 108 07/13/2019   CO2 29 07/13/2019   GLUCOSE 90 07/13/2019   BUN 21 07/13/2019   CREATININE 0.73 07/13/2019   BILITOT 0.3 07/13/2019   ALKPHOS 51 07/18/2013    AST 18 07/13/2019   ALT 20 07/13/2019   PROT 6.3 07/13/2019   ALBUMIN 3.8 07/18/2013   CALCIUM 9.4 07/13/2019   GFRAA 101 07/13/2019    Speciality Comments: No specialty comments available.  Procedures:  No procedures performed Allergies: Patient has no known allergies.   Assessment / Plan:     Visit Diagnoses: Osteopenia of multiple sites  Medication monitoring encounter  Primary osteoarthritis of both hands  Primary osteoarthritis of right knee  History of total knee arthroplasty, left  DDD (degenerative disc disease), lumbar  Mixed hyperlipidemia  History of breast cancer  Status post gastric bypass for obesity  History of gastroesophageal reflux (GERD)  Vestibular migraine  Orders: No  orders of the defined types were placed in this encounter.  No orders of the defined types were placed in this encounter.   Face-to-face time spent with patient was *** minutes. Greater than 50% of time was spent in counseling and coordination of care.  Follow-Up Instructions: No follow-ups on file.   Pollyann Savoy, MD  Note - This record has been created using Animal nutritionist.  Chart creation errors have been sought, but may not always  have been located. Such creation errors do not reflect on  the standard of medical care.

## 2023-01-08 ENCOUNTER — Ambulatory Visit: Payer: BC Managed Care – PPO | Admitting: Physician Assistant

## 2023-01-08 ENCOUNTER — Ambulatory Visit: Payer: PPO | Admitting: Physician Assistant

## 2023-01-08 DIAGNOSIS — M8589 Other specified disorders of bone density and structure, multiple sites: Secondary | ICD-10-CM

## 2023-01-08 DIAGNOSIS — M5136 Other intervertebral disc degeneration, lumbar region: Secondary | ICD-10-CM

## 2023-01-08 DIAGNOSIS — M19042 Primary osteoarthritis, left hand: Secondary | ICD-10-CM

## 2023-01-08 DIAGNOSIS — Z96652 Presence of left artificial knee joint: Secondary | ICD-10-CM

## 2023-01-08 DIAGNOSIS — Z8719 Personal history of other diseases of the digestive system: Secondary | ICD-10-CM

## 2023-01-08 DIAGNOSIS — E782 Mixed hyperlipidemia: Secondary | ICD-10-CM

## 2023-01-08 DIAGNOSIS — M1711 Unilateral primary osteoarthritis, right knee: Secondary | ICD-10-CM

## 2023-01-08 DIAGNOSIS — Z853 Personal history of malignant neoplasm of breast: Secondary | ICD-10-CM

## 2023-01-08 DIAGNOSIS — G43809 Other migraine, not intractable, without status migrainosus: Secondary | ICD-10-CM

## 2023-01-08 DIAGNOSIS — Z9884 Bariatric surgery status: Secondary | ICD-10-CM

## 2023-01-08 DIAGNOSIS — Z5181 Encounter for therapeutic drug level monitoring: Secondary | ICD-10-CM

## 2023-01-08 NOTE — Progress Notes (Unsigned)
Office Visit Note  Patient: Anna Navarro             Date of Birth: March 06, 1956           MRN: [id number]             PCP: Aliene Beams, MD Referring: Elias Else, MD Visit Date: 01/14/2023 Occupation: @GUAROCC @  Subjective:  Pain in both hands  History of Present Illness: Erlinda Sarley is a 67 y.o. female who returns today after her last visit on January 07, 2022.  Patient has history of osteoarthritis and DDD.  Patient states has been experiencing increased pain and discomfort in her hands.  She states her right hand is more painful than the left hand.  She notices intermittent swelling in her right hand.  Her right knee joint bothers off-and-on.  Her left total knee replacement is doing well.  She has some discomfort in her lower back.  She has been walking about 2 miles daily.  She recently joined a gym and will be going for muscle strengthening.  She has been taking calcium and vitamin D.  She has not had a DEXA scan.  Activities of Daily Living:  Patient reports morning stiffness for a few minutes.   Patient Reports nocturnal pain.  Difficulty dressing/grooming: Reports Difficulty climbing stairs: Denies Difficulty getting out of chair: Denies Difficulty using hands for taps, buttons, cutlery, and/or writing: Reports  Review of Systems  Constitutional:  Negative for fatigue.  HENT:  Negative for mouth sores and mouth dryness.   Eyes:  Negative for dryness.  Respiratory:  Negative for shortness of breath.   Cardiovascular:  Negative for chest pain and palpitations.  Gastrointestinal:  Negative for blood in stool, constipation and diarrhea.  Endocrine: Negative for increased urination.  Genitourinary:  Negative for involuntary urination.  Musculoskeletal:  Positive for joint pain, joint pain, joint swelling, myalgias, muscle weakness, morning stiffness and myalgias. Negative for gait problem and muscle tenderness.  Skin:  Negative for color change, rash,  hair loss and sensitivity to sunlight.  Allergic/Immunologic: Negative for susceptible to infections.  Neurological:  Negative for dizziness and headaches.  Hematological:  Negative for swollen glands.  Psychiatric/Behavioral:  Positive for sleep disturbance. Negative for depressed mood. The patient is not nervous/anxious.     PMFS History:  Patient Active Problem List   Diagnosis Date Noted   Daytime somnolence 09/02/2020   Major depression in partial remission (HCC) 09/02/2020   Obstructive sleep apnea syndrome 09/02/2020   Peripheral neuropathy 09/02/2020   Sleep disorder 09/02/2020   Vitamin D deficiency 09/02/2020   Elevated blood-pressure reading, without diagnosis of hypertension 08/02/2019   Muscle weakness of upper extremity 08/02/2019   Osteopenia of multiple sites 06/29/2018   Primary osteoarthritis of both hands 06/29/2018   Primary osteoarthritis of both knees 06/29/2018   DDD (degenerative disc disease), lumbar 06/29/2018   Status post gastric bypass for obesity 06/29/2018   Genetic testing 01/28/2015   Rhinitis, allergic 11/02/2014   Hyperlipemia 11/02/2014   Vestibular migraine 10/29/2014   Spondylolisthesis at L5-S1 level 09/06/2014   History of breast cancer 08/18/2012    Past Medical History:  Diagnosis Date   Allergic rhinitis 02/03/2007   Breast cancer (HCC) 2009   IDC of UOQ of right breast; ER/PR+, Her2-, Ki67 10%; s/p lumpectomy and radiation 2009   Chronic eustachian tube dysfunction 05/23/2010   DDD (degenerative disc disease), lumbar    seeing Dr. Danielle Dess   Depression 02/03/2007   GERD 11/12/2006  H/O iron deficiency anemia 05/09/2007   per pt from grastic bypass   Hyperlipemia 11/12/2006   Low back pain 02/03/2007   Neck mass 01/03/2007   Osteoporosis    Personal history of radiation therapy 2009   UTI (lower urinary tract infection) 11/12/2006   Vestibular neuritis    Warts 01/07/2010   hands    Family History  Problem Relation Age of Onset    Other Mother        bilateral mastectomies at 45y due to precancerous tumors   Breast cancer Mother    Healthy Mother    Heart attack Father    Mental illness Sister    Lung cancer Sister    Breast cancer Maternal Aunt        dx. early 55s   Heart Problems Maternal Aunt    Lung cancer Maternal Uncle        dx. 62-62; former smoker   Lung cancer Maternal Grandmother        dx. 33s; not a smoker; "metastasis to colon"   Healthy Daughter    Healthy Son    Healthy Son    Brain cancer Other        dx. >50s   Cancer Other        dx. <50s; unknown cancer/dx. 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      Home Situation: lives with husband      Spiritual Beliefs: Jewish      Lifestyle: regular exercise 2-4 days per week; trying to eat healthy   Right handed      Immunization History  Administered Date(s) Administered   Influenza, Quadrivalent, Recombinant, Inj, Pf 01/12/2019   PFIZER(Purple Top)SARS-COV-2 Vaccination 07/14/2019, 08/04/2019, 03/01/2020   Td 05/18/1993, 01/12/2019     Objective: Vital Signs: BP 122/77 (BP Location: Left Arm, Patient Position: Sitting, Cuff Size: Normal)   Pulse 69   Resp 14   Ht 5\' 1"  (1.549 m)   Wt 132 lb 6.4 oz (60.1 kg)   BMI 25.02 kg/m    Physical Exam Vitals and nursing note reviewed.  Constitutional:      Appearance: She is well-developed.  HENT:     Head: Normocephalic and atraumatic.  Eyes:     Conjunctiva/sclera: Conjunctivae normal.  Cardiovascular:     Rate and Rhythm:  Normal rate and regular rhythm.     Heart sounds: Normal heart sounds.  Pulmonary:     Effort: Pulmonary effort is normal.     Breath sounds: Normal breath sounds.  Abdominal:     General: Bowel sounds are normal.     Palpations: Abdomen is soft.  Musculoskeletal:     Cervical back: Normal range of motion.  Lymphadenopathy:     Cervical: No cervical adenopathy.  Skin:    General: Skin is warm and dry.     Capillary Refill: Capillary refill takes less than 2 seconds.  Neurological:     Mental Status: She is alert and oriented  to person, place, and time.  Psychiatric:        Behavior: Behavior normal.      Musculoskeletal Exam: Cervical spine was in good range of motion.  Thoracic and lumbar spine were in good range of motion without any discomfort.  Shoulder joints, elbow joints, wrist joints with good range of motion.  She had bilateral CMC subluxation, PIP and DIP thickening.  Some inflammation was noted in the right second and fifth MCP joint.  Right hand PIP joints were tender.  Hip joints and knee joints in good range of motion.  Left knee joint was replaced.  There was no tenderness over ankles or MTPs.  CDAI Exam: CDAI Score: -- Patient Global: --; Provider Global: -- Swollen: --; Tender: -- Joint Exam 01/14/2023   No joint exam has been documented for this visit   There is currently no information documented on the homunculus. Go to the Rheumatology activity and complete the homunculus joint exam.  Investigation: No additional findings.  Imaging: No results found.  Recent Labs: Lab Results  Component Value Date   WBC 5.7 07/13/2019   HGB 13.9 07/13/2019   PLT 257 07/13/2019   NA 142 07/13/2019   K 4.2 07/13/2019   CL 108 07/13/2019   CO2 29 07/13/2019   GLUCOSE 90 07/13/2019   BUN 21 07/13/2019   CREATININE 0.73 07/13/2019   BILITOT 0.3 07/13/2019   ALKPHOS 51 07/18/2013   AST 18 07/13/2019   ALT 20 07/13/2019   PROT 6.3 07/13/2019   ALBUMIN 3.8  07/18/2013   CALCIUM 9.4 07/13/2019   GFRAA 101 07/13/2019    Speciality Comments: Actonel-severe arthralgias  Procedures:  No procedures performed Allergies: Patient has no known allergies.   Assessment / Plan:     Visit Diagnoses: Osteopenia of multiple sites - 03/28/20 DEXA:  The BMD measured at Femur Total Left is 0.734 g/cm2 with a T-score of -2.2.  Patient was advised to get repeat DEXA scan.  She will be discussing it with her PCP.  Her previous DEXA was at breast center.  Use of calcium rich diet with total calcium intake of 1200 mg with vitamin D was advised.  Patient has been exercising on a regular basis.  Resistance training was advised.  Medication monitoring encounter - Previously taking Actonel 150 mg monthly (for about 1 year), but she discontinued due to experiencing severe arthralgias.  Pain in both hands -she has been experiencing increased pain and discomfort in her bilateral hands.  Bilateral CMC PIP and DIP thickening was noted.  Some inflammatory changes were also noted in the right hand.  I will obtain labs and x-rays today.  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  Plan: Sedimentation rate, Rheumatoid factor, Cyclic citrul peptide antibody, IgG, Uric acid, XR Hand 2 View Right, XR Hand 2 View Left.  X-rays were suggestive of osteoarthritis.  X-ray findings were reviewed with the patient.  A handout on hand exercises was given.  I also discussed possible physical therapy in the future.  I advised her to contact us if she has ongoing inflammation and we can consider doing ultrasound of her hands.  She wants to try natural anti-inflammatories for now.  Primary osteoarthritis of both hands-she has bilateral CMC PIP and DIP thickening.  Joint protection muscle strengthening was discussed.  Primary osteoarthritis of right knee-she has intermittent discomfort in her right knee joint.  No warmth swelling or effusion was noted.  I do not have any previous x-rays  to review.  History of total knee arthroplasty, left - Performed by Dr. Thurston Hole 12/21  DDD (degenerative disc disease), lumbar-she has chronic intermittent pain in her lower back.  Vestibular migraine  Mixed hyperlipidemia  History of gastroesophageal reflux (GERD)  History of breast cancer  Status post gastric bypass for obesity  Orders: Orders Placed This Encounter  Procedures   XR Hand 2 View Right   XR Hand 2 View Left   Sedimentation rate   Rheumatoid factor   Cyclic citrul peptide antibody, IgG   Uric acid   No orders of the defined types were placed in this encounter.    Follow-Up Instructions: Return for Osteoarthritis.   Pollyann Savoy, MD  Note - This record has been created using Animal nutritionist.  Chart creation errors have been sought, but may not always  have been located. Such creation errors do not reflect on  the standard of medical care.

## 2023-01-14 ENCOUNTER — Encounter: Payer: Self-pay | Admitting: Rheumatology

## 2023-01-14 ENCOUNTER — Ambulatory Visit (INDEPENDENT_AMBULATORY_CARE_PROVIDER_SITE_OTHER): Payer: PPO

## 2023-01-14 ENCOUNTER — Ambulatory Visit: Payer: PPO | Attending: Rheumatology | Admitting: Rheumatology

## 2023-01-14 ENCOUNTER — Ambulatory Visit: Payer: PPO

## 2023-01-14 VITALS — BP 122/77 | HR 69 | Resp 14 | Ht 61.0 in | Wt 132.4 lb

## 2023-01-14 DIAGNOSIS — M79642 Pain in left hand: Secondary | ICD-10-CM | POA: Diagnosis not present

## 2023-01-14 DIAGNOSIS — M8589 Other specified disorders of bone density and structure, multiple sites: Secondary | ICD-10-CM | POA: Diagnosis not present

## 2023-01-14 DIAGNOSIS — M5136 Other intervertebral disc degeneration, lumbar region: Secondary | ICD-10-CM

## 2023-01-14 DIAGNOSIS — M79641 Pain in right hand: Secondary | ICD-10-CM

## 2023-01-14 DIAGNOSIS — Z9884 Bariatric surgery status: Secondary | ICD-10-CM | POA: Diagnosis not present

## 2023-01-14 DIAGNOSIS — M19041 Primary osteoarthritis, right hand: Secondary | ICD-10-CM

## 2023-01-14 DIAGNOSIS — E782 Mixed hyperlipidemia: Secondary | ICD-10-CM

## 2023-01-14 DIAGNOSIS — M1711 Unilateral primary osteoarthritis, right knee: Secondary | ICD-10-CM

## 2023-01-14 DIAGNOSIS — M19042 Primary osteoarthritis, left hand: Secondary | ICD-10-CM

## 2023-01-14 DIAGNOSIS — Z8719 Personal history of other diseases of the digestive system: Secondary | ICD-10-CM

## 2023-01-14 DIAGNOSIS — G43809 Other migraine, not intractable, without status migrainosus: Secondary | ICD-10-CM | POA: Diagnosis not present

## 2023-01-14 DIAGNOSIS — Z96652 Presence of left artificial knee joint: Secondary | ICD-10-CM

## 2023-01-14 DIAGNOSIS — Z853 Personal history of malignant neoplasm of breast: Secondary | ICD-10-CM

## 2023-01-14 DIAGNOSIS — Z5181 Encounter for therapeutic drug level monitoring: Secondary | ICD-10-CM | POA: Diagnosis not present

## 2023-01-14 NOTE — Patient Instructions (Addendum)
Please discuss getting repeat DEXA scan with your PCP.  Hand Exercises Hand exercises can be helpful for almost anyone. They can strengthen your hands and improve flexibility and movement. The exercises can also increase blood flow to the hands. These results can make your work and daily tasks easier for you. Hand exercises can be especially helpful for people who have joint pain from arthritis or nerve damage from using their hands over and over. These exercises can also help people who injure a hand. Exercises Most of these hand exercises are gentle stretching and motion exercises. It is usually safe to do them often throughout the day. Warming up your hands before exercise may help reduce stiffness. You can do this with gentle massage or by placing your hands in warm water for 10-15 minutes. It is normal to feel some stretching, pulling, tightness, or mild discomfort when you begin new exercises. In time, this will improve. Remember to always be careful and stop right away if you feel sudden, very bad pain or your pain gets worse. You want to get better and be safe. Ask your health care provider which exercises are safe for you. Do exercises exactly as told by your provider and adjust them as told. Do not begin these exercises until told by your provider. Knuckle bend or "claw" fist  Stand or sit with your arm, hand, and all five fingers pointed straight up. Make sure to keep your wrist straight. Gently bend your fingers down toward your palm until the tips of your fingers are touching your palm. Keep your big knuckle straight and only bend the small knuckles in your fingers. Hold this position for 10 seconds. Straighten your fingers back to your starting position. Repeat this exercise 5-10 times with each hand. Full finger fist  Stand or sit with your arm, hand, and all five fingers pointed straight up. Make sure to keep your wrist straight. Gently bend your fingers into your palm until the  tips of your fingers are touching the middle of your palm. Hold this position for 10 seconds. Extend your fingers back to your starting position, stretching every joint fully. Repeat this exercise 5-10 times with each hand. Straight fist  Stand or sit with your arm, hand, and all five fingers pointed straight up. Make sure to keep your wrist straight. Gently bend your fingers at the big knuckle, where your fingers meet your hand, and at the middle knuckle. Keep the knuckle at the tips of your fingers straight and try to touch the bottom of your palm. Hold this position for 10 seconds. Extend your fingers back to your starting position, stretching every joint fully. Repeat this exercise 5-10 times with each hand. Tabletop  Stand or sit with your arm, hand, and all five fingers pointed straight up. Make sure to keep your wrist straight. Gently bend your fingers at the big knuckle, where your fingers meet your hand, as far down as you can. Keep the small knuckles in your fingers straight. Think of forming a tabletop with your fingers. Hold this position for 10 seconds. Extend your fingers back to your starting position, stretching every joint fully. Repeat this exercise 5-10 times with each hand. Finger spread  Place your hand flat on a table with your palm facing down. Make sure your wrist stays straight. Spread your fingers and thumb apart from each other as far as you can until you feel a gentle stretch. Hold this position for 10 seconds. Bring your fingers and thumb  tight together again. Hold this position for 10 seconds. Repeat this exercise 5-10 times with each hand. Making circles  Stand or sit with your arm, hand, and all five fingers pointed straight up. Make sure to keep your wrist straight. Make a circle by touching the tip of your thumb to the tip of your index finger. Hold for 10 seconds. Then open your hand wide. Repeat this motion with your thumb and each of your  fingers. Repeat this exercise 5-10 times with each hand. Thumb motion  Sit with your forearm resting on a table and your wrist straight. Your thumb should be facing up toward the ceiling. Keep your fingers relaxed as you move your thumb. Lift your thumb up as high as you can toward the ceiling. Hold for 10 seconds. Bend your thumb across your palm as far as you can, reaching the tip of your thumb for the small finger (pinkie) side of your palm. Hold for 10 seconds. Repeat this exercise 5-10 times with each hand. Grip strengthening  Hold a stress ball or other soft ball in the middle of your hand. Slowly increase the pressure, squeezing the ball as much as you can without causing pain. Think of bringing the tips of your fingers into the middle of your palm. All of your finger joints should bend when doing this exercise. Hold your squeeze for 10 seconds, then relax. Repeat this exercise 5-10 times with each hand. Contact a health care provider if: Your hand pain or discomfort gets much worse when you do an exercise. Your hand pain or discomfort does not improve within 2 hours after you exercise. If you have either of these problems, stop doing these exercises right away. Do not do them again unless your provider says that you can. Get help right away if: You develop sudden, severe hand pain or swelling. If this happens, stop doing these exercises right away. Do not do them again unless your provider says that you can. This information is not intended to replace advice given to you by your health care provider. Make sure you discuss any questions you have with your health care provider. Document Revised: 05/19/2022 Document Reviewed: 05/19/2022 Elsevier Patient Education  2024 [employer].

## 2023-01-15 ENCOUNTER — Ambulatory Visit: Payer: BC Managed Care – PPO | Admitting: Orthopaedic Surgery

## 2023-01-17 LAB — RHEUMATOID FACTOR: Rheumatoid fact SerPl-aCnc: <10 [IU]/mL (ref <14)

## 2023-01-17 LAB — CYCLIC CITRUL PEPTIDE ANTIBODY, IGG: Cyclic Citrullin Peptide Ab: <16 U

## 2023-01-17 LAB — URIC ACID: Uric Acid, Serum: 3.4 mg/dL (ref 2.5–7.0)

## 2023-01-17 LAB — SEDIMENTATION RATE: Sed Rate: 6 mm/h (ref 0–30)

## 2023-01-18 NOTE — Progress Notes (Signed)
Sed rate normal, RF negative, anti-CCP negative, uric acid normal.  Labs for rheumatoid arthritis and gout are negative.  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

## 2023-01-20 ENCOUNTER — Other Ambulatory Visit: Payer: Self-pay | Admitting: Family Medicine

## 2023-01-20 DIAGNOSIS — Z9884 Bariatric surgery status: Secondary | ICD-10-CM | POA: Diagnosis not present

## 2023-01-20 DIAGNOSIS — Z23 Encounter for immunization: Secondary | ICD-10-CM | POA: Diagnosis not present

## 2023-01-20 DIAGNOSIS — Z0000 Encounter for general adult medical examination without abnormal findings: Secondary | ICD-10-CM | POA: Diagnosis not present

## 2023-01-20 DIAGNOSIS — Z7185 Encounter for immunization safety counseling: Secondary | ICD-10-CM | POA: Diagnosis not present

## 2023-01-20 DIAGNOSIS — E559 Vitamin D deficiency, unspecified: Secondary | ICD-10-CM | POA: Diagnosis not present

## 2023-01-20 DIAGNOSIS — K219 Gastro-esophageal reflux disease without esophagitis: Secondary | ICD-10-CM | POA: Diagnosis not present

## 2023-01-20 DIAGNOSIS — Z1231 Encounter for screening mammogram for malignant neoplasm of breast: Secondary | ICD-10-CM

## 2023-01-20 DIAGNOSIS — Z13220 Encounter for screening for lipoid disorders: Secondary | ICD-10-CM | POA: Diagnosis not present

## 2023-01-20 DIAGNOSIS — E538 Deficiency of other specified B group vitamins: Secondary | ICD-10-CM | POA: Diagnosis not present

## 2023-01-20 DIAGNOSIS — F324 Major depressive disorder, single episode, in partial remission: Secondary | ICD-10-CM | POA: Diagnosis not present

## 2023-01-20 DIAGNOSIS — M8589 Other specified disorders of bone density and structure, multiple sites: Secondary | ICD-10-CM | POA: Diagnosis not present

## 2023-01-20 DIAGNOSIS — G629 Polyneuropathy, unspecified: Secondary | ICD-10-CM | POA: Diagnosis not present

## 2023-02-05 ENCOUNTER — Other Ambulatory Visit: Payer: Self-pay | Admitting: Family Medicine

## 2023-02-05 DIAGNOSIS — M8589 Other specified disorders of bone density and structure, multiple sites: Secondary | ICD-10-CM

## 2023-02-24 NOTE — Progress Notes (Unsigned)
NEUROLOGY FOLLOW UP OFFICE NOTE  Anna Navarro [id number]  Assessment/Plan:   Vestibular migraine/migraine with aura, without status migrainosus, not intractable   Migraine prevention: topiramate 200mg  at bedtime *** Migraine rescue:  diazepam and/or meclizine for vertigo, naproxen for headache Limit use of pain relievers to no more than 2 days out of week to prevent risk of rebound or medication-overuse headache. Keep headache diary Follow up one year     Subjective:  Anna Navarro is a 67 year old right-handed woman with depression and history of breast cancer status post lumpectomy and radiation in 2009 who follows up for vestibular migraine.   UPDATE:  Infrequent but slight increase frequency over past year due to increased stress..  On average, 1 every other month.  Lasts a few hours. Rescue protocol:  Diazepam 2.5mg  (less if taken at work due to concerns of drowsiness) and sometimes meclizine Current NSAIDS:  Naproxen 500mg  Current analgesics:  no Current triptans:  no Current ergotamine:  no Current anti-emetic:  no Current muscle relaxants:  no Current anti-anxiolytic:  Diazepam 2.5mg  (for acute attacks of vertigo) Current sleep aide:  no Current Antihypertensive medications:  no Current Antidepressant medications:  Pristiq Current Anticonvulsant medications:  topiramate 200mg  at bedtime Current anti-CGRP:  no Current Vitamins/Herbal/Supplements:  B12 Current Antihistamines/Decongestants:  meclizine Other therapy:  no Hormone/birth control:  Estradiol   Caffeine:  Very little.  No coffee Alcohol:  2 glasses of wine per week Smoker:  no Diet:  A few glasses of water Exercise:  yes Depression:  no; Anxiety:  no Other pain:  left knee.  Needs TKR. Sleep hygiene:  Good   12/28/2019 LABS:  CBC with WBC 5, HGB 14.2, HCT 41.6, PLT 231; CMP with Na 144, K 4.2, Cl 108, CO2 29, BUN 18, Cr 0.89, glucose 101.   HISTORY: She began having recurrent episodes  of dizziness in March 2015.  She describes it as a sensation of movement, like during a drop on a roller coaster, as well as spinning.  It occurs spontaneously and at rest, but can be aggravated with movement.  Sometimes it is so severe, she was unable to drive and needed her husband to come and pick her up.  She may feel a little quesy, but she denies any significant nausea and no vomiting.  She endorses photophobia, phonophobia and osmophobia.  She denies lightheadedness, vision loss, focal numbness or weakness, or slurred speech. It is associated with a dull, 6/10 non-throbbing holocephalic ache.  There are no known triggers.  Only rest and sleep help.  Initially, at time of her first visit, t typically lasts 2-3 days and occurred once a week to once every other week.  There are no specific relieving factors.  She has taken meclizine, which is not too effective.  Even though the spells are episodic, she didn't quite feel right in between spells.   She was evaluated by Dr. Dorma Russell at the [facility] [place].  An ENG showed central findings.  She had an MRI of the brain with and without contrast, which was reportedly normal.   She denies preceding viral illness or head trauma.  She denies prior history of migraine.  She denies family history of migraine.  Past medications:  gabapentin  PAST MEDICAL HISTORY: Past Medical History:  Diagnosis Date   Allergic rhinitis 02/03/2007   Breast cancer (HCC) 2009   IDC of UOQ of right breast; ER/PR+, Her2-, Ki67 10%; s/p lumpectomy and radiation 2009   Chronic  eustachian tube dysfunction 05/23/2010   DDD (degenerative disc disease), lumbar    seeing Dr. Danielle Dess   Depression 02/03/2007   GERD 11/12/2006   H/O iron deficiency anemia 05/09/2007   per pt from grastic bypass   Hyperlipemia 11/12/2006   Low back pain 02/03/2007   Neck mass 01/03/2007   Osteoporosis    Personal history of radiation therapy 2009   UTI (lower urinary tract infection) 11/12/2006    Vestibular neuritis    Warts 01/07/2010   hands    MEDICATIONS: Current Outpatient Medications on File Prior to Visit  Medication Sig Dispense Refill   AZO-CRANBERRY PO Take by mouth daily.     Cholecalciferol (VITAMIN D3) 5000 UNITS TABS Take 1 capsule by mouth daily. 30 tablet    D-MANNOSE PO Take by mouth daily.     desvenlafaxine (PRISTIQ) 50 MG 24 hr tablet Take 1 tablet (50 mg total) by mouth daily. 90 tablet 3   diazepam (VALIUM) 5 MG tablet Take 0.5 tab every 6 hours as needed for dizziness.  Caution for drowsiness. 15 tablet 2   gabapentin (NEURONTIN) 100 MG capsule take 1 capsule at night (Patient not taking: Reported on 01/14/2023) 90 capsule 3   gabapentin (NEURONTIN) 300 MG capsule Take 300 mg by mouth at bedtime.     meclizine (ANTIVERT) 25 MG tablet Take 1 tablet (25 mg total) by mouth 3 (three) times daily as needed for dizziness. 30 tablet 5   omeprazole (PRILOSEC) 20 MG capsule Take 20 mg by mouth daily.     topiramate (TOPAMAX) 100 MG tablet Take 2 tablets (200 mg total) by mouth at bedtime. 180 tablet 3   vitamin B-12 (CYANOCOBALAMIN) 250 MCG tablet Take 250 mcg by mouth daily.     No current facility-administered medications on file prior to visit.    ALLERGIES: No Known Allergies  FAMILY HISTORY: Family History  Problem Relation Age of Onset   Other Mother        bilateral mastectomies at 45y due to precancerous tumors   Breast cancer Mother    Healthy Mother    Heart attack Father    Mental illness Sister    Lung cancer Sister    Breast cancer Maternal Aunt        dx. early 29s   Heart Problems Maternal Aunt    Lung cancer Maternal Uncle        dx. 62-62; former smoker   Lung cancer Maternal Grandmother        dx. 36s; not a smoker; "metastasis to colon"   Healthy Daughter    Healthy Son    Healthy Son    Brain cancer Other        dx. >50s   Cancer Other        dx. <50s; unknown cancer/dx. 30s; unknown type      Objective:  *** General: No  acute distress.  Patient appears well-groomed.   Head:  Normocephalic/atraumatic Eyes:  Fundi examined but not visualized Neck: supple, no paraspinal tenderness, full range of motion Heart:  Regular rate and rhythm Neurological Exam: alert and oriented.  Speech fluent and not dysarthric, language intact.  CN II-XII intact. Bulk and tone normal, muscle strength 5/5 throughout.  Sensation to light touch intact.  Deep tendon reflexes 2+ throughout.  Finger to nose testing intact.  Gait normal, Romberg negative.   Shon Millet, DO  CC: Aliene Beams, MD

## 2023-02-25 ENCOUNTER — Encounter: Payer: Self-pay | Admitting: Neurology

## 2023-02-25 ENCOUNTER — Ambulatory Visit (INDEPENDENT_AMBULATORY_CARE_PROVIDER_SITE_OTHER): Payer: PPO | Admitting: Neurology

## 2023-02-25 VITALS — BP 144/80 | HR 76 | Ht 61.0 in | Wt 131.0 lb

## 2023-02-25 DIAGNOSIS — G43809 Other migraine, not intractable, without status migrainosus: Secondary | ICD-10-CM

## 2023-02-25 MED ORDER — DIAZEPAM 5 MG PO TABS: ORAL_TABLET | ORAL | 2 refills | Status: AC

## 2023-02-25 MED ORDER — TOPIRAMATE 100 MG PO TABS: 200.0000 mg | ORAL_TABLET | Freq: Every day | ORAL | 3 refills | Status: DC

## 2023-02-25 MED ORDER — MECLIZINE HCL 25 MG PO TABS: 25.0000 mg | ORAL_TABLET | Freq: Three times a day (TID) | ORAL | 5 refills | Status: AC | PRN

## 2023-03-05 ENCOUNTER — Ambulatory Visit
Admission: RE | Admit: 2023-03-05 | Discharge: 2023-03-05 | Disposition: A | Discharge status: Home / Self Care | Payer: PPO | Source: Ambulatory Visit | Attending: Family Medicine | Admitting: Family Medicine

## 2023-03-05 DIAGNOSIS — Z1231 Encounter for screening mammogram for malignant neoplasm of breast: Secondary | ICD-10-CM

## 2023-03-18 DIAGNOSIS — H35372 Puckering of macula, left eye: Secondary | ICD-10-CM | POA: Diagnosis not present

## 2023-04-09 DIAGNOSIS — H43813 Vitreous degeneration, bilateral: Secondary | ICD-10-CM | POA: Diagnosis not present

## 2023-04-09 DIAGNOSIS — H5319 Other subjective visual disturbances: Secondary | ICD-10-CM | POA: Diagnosis not present

## 2023-04-12 DIAGNOSIS — C50911 Malignant neoplasm of unspecified site of right female breast: Secondary | ICD-10-CM | POA: Diagnosis not present

## 2023-04-29 DIAGNOSIS — H6121 Impacted cerumen, right ear: Secondary | ICD-10-CM | POA: Diagnosis not present

## 2023-04-29 DIAGNOSIS — H66012 Acute suppurative otitis media with spontaneous rupture of ear drum, left ear: Secondary | ICD-10-CM | POA: Diagnosis not present

## 2023-05-03 DIAGNOSIS — H6982 Other specified disorders of Eustachian tube, left ear: Secondary | ICD-10-CM | POA: Diagnosis not present

## 2023-07-05 NOTE — Progress Notes (Signed)
 Office Visit Note  Patient: Anna Navarro             Date of Birth: [birth year]           MRN: [id number]             PCP: Aliene Beams, MD Referring: Aliene Beams, MD Visit Date: 07/19/2023 Occupation: @GUAROCC @  Subjective:  Joint stiffness  History of Present Illness: Anna Navarro is a 67 y.o. female with osteoarthritis, degenerative disc disease and osteopenia she returns today after her last visit in August 2024.  Patient states she slipped twice on ice during this winter and had some bruising but did not have major injuries.  She has been having some stiffness in her cervical spine for the last few days she relates this to sleeping wrong.  She has been experiencing some dull headache related to the neck pain.  She has not had much discomfort in her lower back.  She continues to have some discomfort in her hands and her knee joints.  The left total knee replacement is doing well.  She is a DEXA scan is scheduled in April 2025.  She has been taking calcium and vitamin D.  She has taken oral bisphosphonates in the past and could not tolerate them.  She is interested in IV bisphosphonate.  She would like to discuss treatment options today.  Patient states that she had a hysterectomy in her 30s.  She does not know exactly at what age she had menopause.  She had lumpectomy in 2009 after the diagnosis of breast cancer since she had been on gabapentin and Topamax since then.  She has not taken long-term prednisone.  She has been on omeprazole for many years    Activities of Daily Living:  Patient reports morning stiffness for 10  minutes.   Patient Denies nocturnal pain.  Difficulty dressing/grooming: Denies Difficulty climbing stairs: Denies Difficulty getting out of chair: Denies Difficulty using hands for taps, buttons, cutlery, and/or writing: Reports  Review of Systems  Constitutional:  Negative for fatigue.  HENT:  Negative for mouth dryness.   Eyes:   Negative for dryness.  Respiratory:  Negative for shortness of breath.   Cardiovascular:  Negative for chest pain and palpitations.  Gastrointestinal:  Negative for blood in stool, constipation and diarrhea.  Endocrine: Negative for increased urination.  Genitourinary:  Negative for decreased urine output.  Musculoskeletal:  Positive for joint pain, joint pain and morning stiffness. Negative for joint swelling, myalgias and myalgias.  Skin:  Negative for color change, rash and sensitivity to sunlight.  Allergic/Immunologic: Negative for susceptible to infections.  Neurological:  Positive for headaches.  Hematological:  Negative for swollen glands.  Psychiatric/Behavioral:  Negative for depressed mood and sleep disturbance. The patient is not nervous/anxious.     PMFS History:  Patient Active Problem List   Diagnosis Date Noted   Daytime somnolence 09/02/2020   Major depression in partial remission (HCC) 09/02/2020   Obstructive sleep apnea syndrome 09/02/2020   Peripheral neuropathy 09/02/2020   Sleep disorder 09/02/2020   Vitamin D deficiency 09/02/2020   Elevated blood-pressure reading, without diagnosis of hypertension 08/02/2019   Muscle weakness of upper extremity 08/02/2019   Osteopenia of multiple sites 06/29/2018   Primary osteoarthritis of both hands 06/29/2018   Primary osteoarthritis of both knees 06/29/2018   DDD (degenerative disc disease), lumbar 06/29/2018   Status post gastric bypass for obesity 06/29/2018   Genetic testing 01/28/2015   Rhinitis, allergic 11/02/2014  Hyperlipemia 11/02/2014   Vestibular migraine 10/29/2014   Spondylolisthesis at L5-S1 level 09/06/2014   History of breast cancer 08/18/2012    Past Medical History:  Diagnosis Date   Allergic rhinitis 02/03/2007   Breast cancer (HCC) 2009   IDC of UOQ of right breast; ER/PR+, Her2-, Ki67 10%; s/p lumpectomy and radiation 2009   Chronic eustachian tube dysfunction 05/23/2010   DDD (degenerative  disc disease), lumbar    seeing Dr. Danielle Dess   Depression 02/03/2007   GERD 11/12/2006   H/O iron deficiency anemia 05/09/2007   per pt from grastic bypass   Hyperlipemia 11/12/2006   Low back pain 02/03/2007   Neck mass 01/03/2007   Osteoporosis    Personal history of radiation therapy 2009   UTI (lower urinary tract infection) 11/12/2006   Vestibular neuritis    Warts 01/07/2010   hands    Family History  Problem Relation Age of Onset   Other Mother        bilateral mastectomies at 45y due to precancerous tumors   Breast cancer Mother    Healthy Mother    Heart attack Father    Mental illness Sister    Lung cancer Sister    Breast cancer Maternal Aunt        dx. early 66s   Heart Problems Maternal Aunt    Lung cancer Maternal Uncle        dx. 62-62; former smoker   Lung cancer Maternal Grandmother        dx. 68s; not a smoker; "metastasis to colon"   Healthy Daughter    Healthy Son    Healthy Son    Brain cancer Other        dx. >50s   Cancer Other        dx. <50s; unknown cancer/dx. 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      Home Situation: lives with husband      Spiritual Beliefs: Jewish      Lifestyle: regular exercise 2-4 days per week; trying to eat healthy   Right handed      Immunization History  Administered Date(s) Administered   Influenza, Quadrivalent, Recombinant, Inj, Pf 01/12/2019   PFIZER(Purple Top)SARS-COV-2 Vaccination 07/14/2019, 08/04/2019, 03/01/2020   Td 05/18/1993, 01/12/2019      Objective: Vital Signs: BP 128/84 (BP Location: Left Arm, Patient Position: Sitting, Cuff Size: Normal)   Pulse 73   Resp 14   Ht 5' 1.5" (1.562 m)   Wt 129 lb 12.8 oz (58.9 kg)   BMI 24.13 kg/m    Physical Exam Vitals and nursing note reviewed.  Constitutional:      Appearance: She is well-developed.  HENT:     Head: Normocephalic and atraumatic.  Eyes:     Conjunctiva/sclera: Conjunctivae normal.  Cardiovascular:     Rate and Rhythm: Normal rate and regular rhythm.     Heart sounds: Normal heart sounds.  Pulmonary:     Effort: Pulmonary effort is  normal.     Breath sounds: Normal breath sounds.  Abdominal:     General: Bowel sounds are normal.     Palpations: Abdomen is soft.  Musculoskeletal:     Cervical back: Normal range of motion.  Lymphadenopathy:     Cervical: No cervical adenopathy.  Skin:    General: Skin is warm and dry.     Capillary Refill: Capillary refill takes less than 2 seconds.  Neurological:     Mental Status: She is alert and oriented to person, place, and time.  Psychiatric:        Behavior: Behavior normal.      Musculoskeletal Exam: Patient has some limitation and stiffness with range of motion of the cervical spine.  There was no tenderness over thoracic or lumbar spine.  Mild lumbar levoscoliosis was noted.  Shoulder joints, elbow joints, wrist joints, MCPs PIPs and DIPs were in good range of motion.  Hip joints were in good range of motion.  Left knee joint had limited extension which was replaced.  Right knee joint was in good range of motion.  There was no tenderness over ankles or MTPs.  CDAI Exam: CDAI Score: -- Patient Global: --; Provider Global: -- Swollen: --; Tender: -- Joint Exam 07/19/2023   No joint exam has been documented for this visit   There is currently no information documented on the homunculus. Go to the Rheumatology activity and complete the homunculus joint exam.  Investigation: No additional  findings.  Imaging: No results found.  Recent Labs: Lab Results  Component Value Date   WBC 5.7 07/13/2019   HGB 13.9 07/13/2019   PLT 257 07/13/2019   NA 142 07/13/2019   K 4.2 07/13/2019   CL 108 07/13/2019   CO2 29 07/13/2019   GLUCOSE 90 07/13/2019   BUN 21 07/13/2019   CREATININE 0.73 07/13/2019   BILITOT 0.3 07/13/2019   ALKPHOS 51 07/18/2013   AST 18 07/13/2019   ALT 20 07/13/2019   PROT 6.3 07/13/2019   ALBUMIN 3.8 07/18/2013   CALCIUM 9.4 07/13/2019   GFRAA 101 07/13/2019    Speciality Comments: Actonel-severe arthralgias  Procedures:  No procedures performed Allergies: Patient has no known allergies.   Assessment / Plan:     Visit Diagnoses: Osteopenia of multiple sites - 03/28/20 DEXA:  The BMD measured at Femur Total Left is 0.734 g/cm2 with a T-score of -2.2.  Patient has tried Fosamax and Actonel in the past and had intolerance to both medications.  Patient had early menopause had a hysterectomy.  She is also taken long-term gabapentin, Topamax and omeprazole.  I did detailed discussion with the patient regarding anabolic agents, bisphosphonates and Prolia.  Patient had to have radiation therapy in the past and would not be a candidate for Tymlos or Forteo.  I discussed the option of Evenity but she did not like the side effects.  She would prefer to go on IV bisphosphonates.  Indications side effects contraindications of IV Reclast including the increased risk of osteonecrosis of the jaw and atypical fracture was discussed.  Patient wants to proceed with IV Reclast after the DEXA scan results if needed.  Her bone density is due in April.  She will return for follow-up visit after that.  Medication monitoring encounter - Previously taking Actonel 150 mg monthly (for about 1 year), but she discontinued due to experiencing severe arthralgias.  Pain in both hands -she cannot use to have some stiffness in her hands.  Joint protection was  discussed.  All autoimmune  workup was negative in the past.  Labs were reviewed.  X-rays were suggestive of osteoarthritis.  January 14, 2023 sed rate 6, RF negative, anti-CCP negative, uric acid 3.4.  Primary osteoarthritis of both hands-she has mild PIP and DIP thickening.  Primary osteoarthritis of right knee-she has intermittent pain.  Lower extremity muscle strength ESIs were discussed.  History of total knee arthroplasty, left - Performed by Dr. Thurston Hole 12/21.  She has limited extension of left knee joint.  Neck stiffness-she has experiencing some stiffness in the neck.  She had bilateral trapezius spasm.  She has a massage therapy scheduled tomorrow.  Spondylosis of lumbar spine-she denies any discomfort in her lumbar spine today.  She was able to reach her toes without any difficulty.  Vestibular migraine  Mixed hyperlipidemia  History of gastroesophageal reflux (GERD)  History of breast cancer - 2009 lumpectomy, RTX , ttd with Tamoxifen  x 5years.  Status post gastric bypass for obesity  Orders: No orders of the defined types were placed in this encounter.  No orders of the defined types were placed in this encounter.  Face-to-face time spent patient was over 30 minutes.  More than 50% time was spent in counseling and coordination of care.  Follow-Up Instructions: Return in about 3 months (around 10/19/2023) for Osteoarthritis.  Osteopenia.   Pollyann Savoy, MD  Note - This record has been created using Animal nutritionist.  Chart creation errors have been sought, but may not always  have been located. Such creation errors do not reflect on  the standard of medical care.

## 2023-07-19 ENCOUNTER — Encounter: Payer: Self-pay | Admitting: Rheumatology

## 2023-07-19 ENCOUNTER — Ambulatory Visit: Payer: BC Managed Care – PPO | Attending: Rheumatology | Admitting: Rheumatology

## 2023-07-19 VITALS — BP 128/84 | HR 73 | Resp 14 | Ht 61.5 in | Wt 129.8 lb

## 2023-07-19 DIAGNOSIS — M8589 Other specified disorders of bone density and structure, multiple sites: Secondary | ICD-10-CM | POA: Diagnosis not present

## 2023-07-19 DIAGNOSIS — M436 Torticollis: Secondary | ICD-10-CM

## 2023-07-19 DIAGNOSIS — Z5181 Encounter for therapeutic drug level monitoring: Secondary | ICD-10-CM | POA: Diagnosis not present

## 2023-07-19 DIAGNOSIS — Z9884 Bariatric surgery status: Secondary | ICD-10-CM

## 2023-07-19 DIAGNOSIS — M19041 Primary osteoarthritis, right hand: Secondary | ICD-10-CM

## 2023-07-19 DIAGNOSIS — Z96652 Presence of left artificial knee joint: Secondary | ICD-10-CM

## 2023-07-19 DIAGNOSIS — M1711 Unilateral primary osteoarthritis, right knee: Secondary | ICD-10-CM

## 2023-07-19 DIAGNOSIS — M79642 Pain in left hand: Secondary | ICD-10-CM

## 2023-07-19 DIAGNOSIS — Z853 Personal history of malignant neoplasm of breast: Secondary | ICD-10-CM

## 2023-07-19 DIAGNOSIS — M79641 Pain in right hand: Secondary | ICD-10-CM | POA: Diagnosis not present

## 2023-07-19 DIAGNOSIS — E782 Mixed hyperlipidemia: Secondary | ICD-10-CM

## 2023-07-19 DIAGNOSIS — M47816 Spondylosis without myelopathy or radiculopathy, lumbar region: Secondary | ICD-10-CM

## 2023-07-19 DIAGNOSIS — M19042 Primary osteoarthritis, left hand: Secondary | ICD-10-CM

## 2023-07-19 DIAGNOSIS — Z8719 Personal history of other diseases of the digestive system: Secondary | ICD-10-CM

## 2023-07-19 DIAGNOSIS — G43809 Other migraine, not intractable, without status migrainosus: Secondary | ICD-10-CM

## 2023-07-19 NOTE — Patient Instructions (Addendum)
 Cervical Strain and Sprain Rehab Ask your health care provider which exercises are safe for you. Do exercises exactly as told by your health care provider and adjust them as directed. It is normal to feel mild stretching, pulling, tightness, or discomfort as you do these exercises. Stop right away if you feel sudden pain or your pain gets worse. Do not begin these exercises until told by your health care provider. Stretching and range-of-motion exercises Cervical side bending  Using good posture, sit on a stable chair or stand up. Without moving your shoulders, slowly tilt your left / right ear to your shoulder until you feel a stretch in the neck muscles on the opposite side. You should be looking straight ahead. Hold for __________ seconds. Repeat with the other side of your neck. Repeat __________ times. Complete this exercise __________ times a day. Cervical rotation  Using good posture, sit on a stable chair or stand up. Slowly turn your head to the side as if you are looking over your left / right shoulder. Keep your eyes level with the ground. Stop when you feel a stretch along the side and the back of your neck. Hold for __________ seconds. Repeat this by turning to your other side. Repeat __________ times. Complete this exercise __________ times a day. Thoracic extension and pectoral stretch  Roll a towel or a small blanket so it is about 4 inches (10 cm) in diameter. Lie down on your back on a firm surface. Put the towel in the middle of your back across your spine. It should not be under your shoulder blades. Put your hands behind your head and let your elbows fall out to your sides. Hold for __________ seconds. Repeat __________ times. Complete this exercise __________ times a day. Strengthening exercises Upper cervical flexion  Lie on your back with a thin pillow behind your head or a small, rolled-up towel under your neck. Gently tuck your chin toward your chest and nod  your head down to look toward your feet. Do not lift your head off the pillow. Hold for __________ seconds. Release the tension slowly. Relax your neck muscles completely before you repeat this exercise. Repeat __________ times. Complete this exercise __________ times a day. Cervical extension  Stand about 6 inches (15 cm) away from a wall, with your back facing the wall. Place a soft object, about 6-8 inches (15-20 cm) in diameter, between the back of your head and the wall. A soft object could be a small pillow, a ball, or a folded towel. Gently tilt your head back and press into the soft object. Keep your jaw and forehead relaxed. Hold for __________ seconds. Release the tension slowly. Relax your neck muscles completely before you repeat this exercise. Repeat __________ times. Complete this exercise __________ times a day. Posture and body mechanics Body mechanics refer to the movements and positions of your body while you do your daily activities. Posture is part of body mechanics. Good posture and healthy body mechanics can help to relieve stress in your body's tissues and joints. Good posture means that your spine is in its natural S-curve position (your spine is neutral), your shoulders are pulled back slightly, and your head is not tipped forward. The following are general guidelines for using improved posture and body mechanics in your everyday activities. Sitting  When sitting, keep your spine neutral and keep your feet flat on the floor. Use a footrest, if needed, and keep your thighs parallel to the floor. Avoid rounding  your shoulders. Avoid tilting your head forward. When working at a desk or a computer, keep your desk at a height where your hands are slightly lower than your elbows. Slide your chair under your desk so you are close enough to maintain good posture. When working at a computer, place your monitor at a height where you are looking straight ahead and you do not have to  tilt your head forward or downward to look at the screen. Standing  When standing, keep your spine neutral and keep your feet about hip-width apart. Keep a slight bend in your knees. Your ears, shoulders, and hips should line up. When you do a task in which you stand in one place for a long time, place one foot up on a stable object that is 2-4 inches (5-10 cm) high, such as a footstool. This helps keep your spine neutral. Resting When lying down and resting, avoid positions that are most painful for you. Try to support your neck in a neutral position. You can use a contour pillow or a small rolled-up towel. Your pillow should support your neck but not push on it. This information is not intended to replace advice given to you by your health care provider. Make sure you discuss any questions you have with your health care provider. Document Revised: 09/07/2022 Document Reviewed: 11/24/2021 Elsevier Patient Education  2024 Elsevier Inc.  Zoledronic Acid Injection (Bone Disorders) What is this medication? ZOLEDRONIC ACID (ZOE le dron ik AS id) prevents and treats osteoporosis. It may also be used to treat Paget's disease of the bone. It works by Interior and spatial designer stronger and less likely to break (fracture). It belongs to a group of medications called bisphosphonates. This medicine may be used for other purposes; ask your health care provider or pharmacist if you have questions. COMMON BRAND NAME(S): Reclast What should I tell my care team before I take this medication? They need to know if you have any of these conditions: Bleeding disorder Cancer Dental disease Kidney disease Low levels of calcium in the blood Low red blood cell counts Lung or breathing disease, such as asthma Receiving steroids, such as dexamethasone or prednisone An unusual or allergic reaction to zoledronic acid, other medications, foods, dyes, or preservatives Pregnant or trying to get pregnant Breast-feeding How  should I use this medication? This medication is injected into a vein. It is given by your care team in a hospital or clinic setting. A special MedGuide will be given to you before each treatment. Be sure to read this information carefully each time. Talk to your care team about the use of this medication in children. Special care may be needed. Overdosage: If you think you have taken too much of this medicine contact a poison control center or emergency room at once. NOTE: This medicine is only for you. Do not share this medicine with others. What if I miss a dose? Keep appointments for follow-up doses. It is important not to miss your dose. Call your care team if you are unable to keep an appointment. What may interact with this medication? Certain antibiotics given by injection Medications for pain and inflammation, such as ibuprofen, naproxen, NSAIDs Some diuretics, such as bumetanide, furosemide Teriparatide This list may not describe all possible interactions. Give your health care provider a list of all the medicines, herbs, non-prescription drugs, or dietary supplements you use. Also tell them if you smoke, drink alcohol, or use illegal drugs. Some items may interact with your medicine.  What should I watch for while using this medication? Visit your care team for regular checks on your progress. It may be some time before you see the benefit from this medication. Some people who take this medication have severe bone, joint, or muscle pain. This medication may also increase your risk for jaw problems or a broken thigh bone. Tell your care team right away if you have severe pain in your jaw, bones, joints, or muscles. Tell your care team if you have any pain that does not go away or that gets worse. You should make sure you get enough calcium and vitamin D while you are taking this medication. Discuss the foods you eat and the vitamins you take with your care team. You may need bloodwork  while taking this medication. Tell your dentist and dental surgeon that you are taking this medication. You should not have major dental surgery while on this medication. See your dentist to have a dental exam and fix any dental problems before starting this medication. Take good care of your teeth while on this medication. Make sure you see your dentist for regular follow-up appointments. What side effects may I notice from receiving this medication? Side effects that you should report to your care team as soon as possible: Allergic reactions--skin rash, itching, hives, swelling of the face, lips, tongue, or throat Kidney injury--decrease in the amount of urine, swelling of the ankles, hands, or feet Low calcium level--muscle pain or cramps, confusion, tingling, or numbness in the hands or feet Osteonecrosis of the jaw--pain, swelling, or redness in the mouth, numbness of the jaw, poor healing after dental work, unusual discharge from the mouth, visible bones in the mouth Severe bone, joint, or muscle pain Side effects that usually do not require medical attention (report to your care team if they continue or are bothersome): Diarrhea Dizziness Headache Nausea Stomach pain Vomiting This list may not describe all possible side effects. Call your doctor for medical advice about side effects. You may report side effects to FDA at 1-800-FDA-1088. Where should I keep my medication? This medication is given in a hospital or clinic. It will not be stored at home. NOTE: This sheet is a summary. It may not cover all possible information. If you have questions about this medicine, talk to your doctor, pharmacist, or health care provider.  2024 Elsevier/Gold Standard (2021-06-20 00:00:00)

## 2023-08-24 ENCOUNTER — Ambulatory Visit
Admission: RE | Admit: 2023-08-24 | Discharge: 2023-08-24 | Disposition: A | Discharge status: Home / Self Care | Payer: BC Managed Care – PPO | Source: Ambulatory Visit | Attending: Family Medicine

## 2023-08-24 DIAGNOSIS — M8589 Other specified disorders of bone density and structure, multiple sites: Secondary | ICD-10-CM

## 2023-08-24 DIAGNOSIS — M810 Age-related osteoporosis without current pathological fracture: Secondary | ICD-10-CM | POA: Diagnosis not present

## 2023-08-27 NOTE — Progress Notes (Signed)
 T-score -2.4 left femoral neck suggestive of osteopenia.  Decrease in T-score was noted when compared to the previous DEXA scan.  Advise calcium plus vitamin D and resistive exercises.  We discussed IV Reclast at the last visit.  We can discuss this further at the follow-up visit in June.

## 2023-10-05 NOTE — Progress Notes (Signed)
 Office Visit Note  Patient: Anna Navarro             Date of Birth: [birth year]           MRN: [id number]             PCP: Dorena Gander, MD Referring: Dorena Gander, MD Visit Date: 10/19/2023 Occupation: @GUAROCC @  Subjective:  Discuss DEXA results   History of Present Illness: Anna Navarro is a 68 y.o. female with history of osteopenia and osteoarthritis.  Patient presents today to discuss DEXA results from 08/24/2023.  Patient has been taking calcium and vitamin D  supplement daily.  She denies any recent falls or fractures.  She has been walking at least 1-1/2 miles on a daily basis and has been going to the gym for weight lifting.  Patient presents today to discuss treatment options for osteoporosis management. She continues to have ongoing discomfort in both thumbs and both great toes unilaterally osteoarthritis.  She has noted some increased thickening and swelling in these joints.  She has gone to hand therapy in the past.    Activities of Daily Living:  Patient denies any morning stiffness  Patient Denies nocturnal pain.  Difficulty dressing/grooming: Denies Difficulty climbing stairs: Denies Difficulty getting out of chair: Denies Difficulty using hands for taps, buttons, cutlery, and/or writing: Reports  Review of Systems  Constitutional:  Positive for fatigue.  HENT:  Negative for mouth sores and mouth dryness.   Eyes:  Negative for dryness.  Respiratory:  Negative for shortness of breath.   Cardiovascular:  Negative for chest pain and palpitations.  Gastrointestinal:  Negative for blood in stool, constipation and diarrhea.  Endocrine: Negative for increased urination.  Genitourinary:  Negative for involuntary urination.  Musculoskeletal:  Positive for joint pain, gait problem, joint pain and joint swelling. Negative for myalgias, muscle weakness, morning stiffness, muscle tenderness and myalgias.  Skin:  Negative for color change, rash, hair loss  and sensitivity to sunlight.  Allergic/Immunologic: Negative for susceptible to infections.  Neurological:  Negative for dizziness and headaches.  Hematological:  Negative for swollen glands.  Psychiatric/Behavioral:  Positive for sleep disturbance. Negative for depressed mood. The patient is not nervous/anxious.     PMFS History:  Patient Active Problem List   Diagnosis Date Noted   Daytime somnolence 09/02/2020   Major depression in partial remission (HCC) 09/02/2020   Obstructive sleep apnea syndrome 09/02/2020   Peripheral neuropathy 09/02/2020   Sleep disorder 09/02/2020   Vitamin D  deficiency 09/02/2020   Elevated blood-pressure reading, without diagnosis of hypertension 08/02/2019   Muscle weakness of upper extremity 08/02/2019   Osteopenia of multiple sites 06/29/2018   Primary osteoarthritis of both hands 06/29/2018   Primary osteoarthritis of both knees 06/29/2018   DDD (degenerative disc disease), lumbar 06/29/2018   Status post gastric bypass for obesity 06/29/2018   Genetic testing 01/28/2015   Rhinitis, allergic 11/02/2014   Hyperlipemia 11/02/2014   Vestibular migraine 10/29/2014   Spondylolisthesis at L5-S1 level 09/06/2014   History of breast cancer 08/18/2012    Past Medical History:  Diagnosis Date   Allergic rhinitis 02/03/2007   Breast cancer (HCC) 2009   IDC of UOQ of right breast; ER/PR+, Her2-, Ki67 10%; s/p lumpectomy and radiation 2009   Chronic eustachian tube dysfunction 05/23/2010   DDD (degenerative disc disease), lumbar    seeing Dr. Ellery Guthrie   Depression 02/03/2007   GERD 11/12/2006   H/O iron deficiency anemia 05/09/2007   per pt from grastic bypass  Hyperlipemia 11/12/2006   Low back pain 02/03/2007   Neck mass 01/03/2007   Osteoporosis    Personal history of radiation therapy 2009   UTI (lower urinary tract infection) 11/12/2006   Vestibular neuritis    Warts 01/07/2010   hands    Family History  Problem Relation Age of Onset   Other  Mother        bilateral mastectomies at 45y due to precancerous tumors   Breast cancer Mother    Healthy Mother    Heart attack Father    Mental illness Sister    Lung cancer Sister    Breast cancer Maternal Aunt        dx. early 53s   Heart Problems Maternal Aunt    Lung cancer Maternal Uncle        dx. 62-62; former smoker   Lung cancer Maternal Grandmother        dx. 66s; not a smoker; "metastasis to colon"   Healthy Daughter    Healthy Son    Healthy Son    Brain cancer Other        dx. >50s   Cancer Other        dx. <50s; unknown cancer/dx. 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      Home Situation: lives with husband      Spiritual Beliefs: Jewish      Lifestyle: regular exercise 2-4 days per week; trying to eat healthy   Right handed      Immunization History  Administered Date(s) Administered   Influenza, Quadrivalent, Recombinant, Inj, Pf 01/12/2019   PFIZER(Purple Top)SARS-COV-2 Vaccination 07/14/2019, 08/04/2019, 03/01/2020   Td 05/18/1993, 01/12/2019     Objective: Vital Signs: BP 124/83 (BP Location: Left Arm, Patient Position: Sitting, Cuff Size: Normal)   Pulse 73   Resp 16   Ht 5' 1.5" (1.562 m)   Wt 129 lb 9.6 oz (58.8 kg)   BMI 24.09 kg/m    Physical Exam Vitals and nursing note reviewed.  Constitutional:      Appearance: She is well-developed.  HENT:     Head: Normocephalic and atraumatic.  Eyes:     Conjunctiva/sclera: Conjunctivae normal.  Cardiovascular:     Rate and Rhythm: Normal  rate and regular rhythm.     Heart sounds: Normal heart sounds.  Pulmonary:     Effort: Pulmonary effort is normal.     Breath sounds: Normal breath sounds.  Abdominal:     General: Bowel sounds are normal.     Palpations: Abdomen is soft.  Musculoskeletal:     Cervical back: Normal range of motion.  Lymphadenopathy:     Cervical: No cervical adenopathy.  Skin:    General: Skin is warm and dry.     Capillary Refill: Capillary refill takes less than 2 seconds.  Neurological:     Mental Status: She is alert and oriented to person, place, and time.  Psychiatric:  Behavior: Behavior normal.      Musculoskeletal Exam: C-spine has slightly limited range of motion.  No midline spinal tenderness.  Shoulder joints, elbow joints, wrist joints have good range of motion with no tenderness or inflammation.  Thickening of the right first PIP joint with tenderness upon palpation.  Tenderness over the right first MCP joint noted.  DIP prominence noted.  Hip joints have good range of motion with no groin pain.  Left knee replacement has limited extension.  Right knee joint has good range of motion no warmth or effusion.  Ankle joints have good range of motion with no tenderness or joint swelling.  Tenderness of bilateral first MTP joints.  No synovitis over MTP joints.  PIP and DIP thickening consistent with osteoarthritis of both feet.  CDAI Exam: CDAI Score: -- Patient Global: --; Provider Global: -- Swollen: --; Tender: -- Joint Exam 10/19/2023   No joint exam has been documented for this visit   There is currently no information documented on the homunculus. Go to the Rheumatology activity and complete the homunculus joint exam.  Investigation: No additional findings.  Imaging: No results found.  Recent Labs: Lab Results  Component Value Date   WBC 5.7 07/13/2019   HGB 13.9 07/13/2019   PLT 257 07/13/2019   NA 142 07/13/2019   K 4.2 07/13/2019   CL 108 07/13/2019   CO2 29  07/13/2019   GLUCOSE 90 07/13/2019   BUN 21 07/13/2019   CREATININE 0.73 07/13/2019   BILITOT 0.3 07/13/2019   ALKPHOS 51 07/18/2013   AST 18 07/13/2019   ALT 20 07/13/2019   PROT 6.3 07/13/2019   ALBUMIN 3.8 07/18/2013   CALCIUM 9.4 07/13/2019   GFRAA 101 07/13/2019    Speciality Comments: Actonel -severe arthralgias  Procedures:  No procedures performed Allergies: Patient has no known allergies.    Assessment / Plan:     Visit Diagnoses: Age-related osteoporosis without current pathological fracture:  03/28/20 DEXA:  The BMD measured at Femur Total Left is 0.734 g/cm2 with a T-score of -2.2. intolerance to fosamax  and actonel . Early menopause/hysterectomy.  Long-term gabapentin , Topamax  and omeprazole  use. Radiation therapy in the past and would not be a candidate for Tymlos or Forteo.  DEXA updated on 08/24/23: Lumbar spine BMD 0.980 with T-score -1.6, LFN BMD 0.698 T-score -2.4, Left total hip BMD 0.690 T-score -2.5, RFN BMD 0.743 T-score -2.1.  rate of change since last scan -5.1%.   She is taking a calcium and vitamin D  supplement daily.  Plan to proceed with IV reclast.   Reviewed indications, contraindications, potential side effects of IV Reclast today in detail.  All questions were addressed.  Plan to apply for IV Reclast through her insurance. No invasive upcoming dental work.  Plan to update vitamin D  and CMP with GFR today.    Next DEXA will be due in April 2027.  She will follow up in 6 months or sooner if needed. Plan: VITAMIN D  25 Hydroxy (Vit-D Deficiency, Fractures)  Medication monitoring encounter - Previously taking Actonel  150 mg monthly (for about 1 year), but she discontinued due to experiencing severe arthralgias.  Plan to proceed with IV reclast.   CMP with GFR and Vitamin D  updated today. - Plan: VITAMIN D  25 Hydroxy (Vit-D Deficiency, Fractures), Comprehensive metabolic panel with GFR  Vitamin D  deficiency - Vitamin D  level obtained today. Plan: VITAMIN D   25 Hydroxy (Vit-D Deficiency, Fractures)  Pain in both hands - X-rays were suggestive of osteoarthritis.  01/14/2023: sed  rate 6, RF negative, anti-CCP negative, uric acid 3.4. No synovitis was noted.   Primary osteoarthritis of both hands: Patient presents today with ongoing pain in both thumbs, right greater than left.  She has tenderness over the right first MCP and PIP joint.  Thickening of the right first PIP joint was noted.  No synovitis was noted.  Complete fist formation bilaterally.  Patient has completed hand therapy in the past.  She uses Voltaren gel topically as needed for pain relief.  Discussed importance of joint protection and muscle strengthening.  Discussed that if her symptoms persist or worsen an ultrasound-guided cortisone injection can be performed in the future if needed.   Primary osteoarthritis of right knee: She has good range of motion of the right knee joint on examination today.  No warmth or effusion noted.  History of total knee arthroplasty, left - Performed by Dr. Jinger Mount 12/21. Limited extension.  No effusion noted. No discomfort at this time. She has been walking on a daily basis for exercise.    Neck stiffness: Slightly limited ROM with lateral rotation.   Spondylosis of lumbar spine: No symptoms of radiculopathy.   Other medical conditions are listed as follows:   Vestibular migraine  Mixed hyperlipidemia  History of gastroesophageal reflux (GERD)  History of breast cancer  Status post gastric bypass for obesity    Orders: Orders Placed This Encounter  Procedures   VITAMIN D  25 Hydroxy (Vit-D Deficiency, Fractures)   Comprehensive metabolic panel with GFR   No orders of the defined types were placed in this encounter.   Follow-Up Instructions: Return in about 6 months (around 04/19/2024) for Osteopenia, Osteoarthritis.   Romayne Clubs, PA-C  Note - This record has been created using Dragon software.  Chart creation errors have been  sought, but may not always  have been located. Such creation errors do not reflect on  the standard of medical care.

## 2023-10-19 ENCOUNTER — Encounter: Payer: Self-pay | Admitting: Physician Assistant

## 2023-10-19 ENCOUNTER — Ambulatory Visit: Attending: Physician Assistant | Admitting: Physician Assistant

## 2023-10-19 VITALS — BP 124/83 | HR 73 | Resp 16 | Ht 61.5 in | Wt 129.6 lb

## 2023-10-19 DIAGNOSIS — E559 Vitamin D deficiency, unspecified: Secondary | ICD-10-CM | POA: Diagnosis not present

## 2023-10-19 DIAGNOSIS — Z8719 Personal history of other diseases of the digestive system: Secondary | ICD-10-CM

## 2023-10-19 DIAGNOSIS — M79642 Pain in left hand: Secondary | ICD-10-CM

## 2023-10-19 DIAGNOSIS — E782 Mixed hyperlipidemia: Secondary | ICD-10-CM | POA: Diagnosis not present

## 2023-10-19 DIAGNOSIS — M47816 Spondylosis without myelopathy or radiculopathy, lumbar region: Secondary | ICD-10-CM

## 2023-10-19 DIAGNOSIS — Z853 Personal history of malignant neoplasm of breast: Secondary | ICD-10-CM

## 2023-10-19 DIAGNOSIS — M810 Age-related osteoporosis without current pathological fracture: Secondary | ICD-10-CM | POA: Diagnosis not present

## 2023-10-19 DIAGNOSIS — M1711 Unilateral primary osteoarthritis, right knee: Secondary | ICD-10-CM

## 2023-10-19 DIAGNOSIS — Z96652 Presence of left artificial knee joint: Secondary | ICD-10-CM | POA: Diagnosis not present

## 2023-10-19 DIAGNOSIS — M19041 Primary osteoarthritis, right hand: Secondary | ICD-10-CM | POA: Diagnosis not present

## 2023-10-19 DIAGNOSIS — M79641 Pain in right hand: Secondary | ICD-10-CM | POA: Diagnosis not present

## 2023-10-19 DIAGNOSIS — Z9884 Bariatric surgery status: Secondary | ICD-10-CM

## 2023-10-19 DIAGNOSIS — M19042 Primary osteoarthritis, left hand: Secondary | ICD-10-CM

## 2023-10-19 DIAGNOSIS — G43809 Other migraine, not intractable, without status migrainosus: Secondary | ICD-10-CM

## 2023-10-19 DIAGNOSIS — Z5181 Encounter for therapeutic drug level monitoring: Secondary | ICD-10-CM

## 2023-10-19 DIAGNOSIS — M436 Torticollis: Secondary | ICD-10-CM

## 2023-10-19 DIAGNOSIS — M8589 Other specified disorders of bone density and structure, multiple sites: Secondary | ICD-10-CM | POA: Diagnosis not present

## 2023-10-19 NOTE — Patient Instructions (Signed)

## 2023-10-19 NOTE — Progress Notes (Signed)
 Pharmacy Note  Subjective: Patient presents today to the [facility] for follow up office visit.   Patient seen by pharmacist for counseling on Reclast therapy for osteoporosis. Prior osteoporosis treatment includes:Intolerance to alendronate  and Actonel . Early menopause/hysterectomy.  Long-term gabapentin , topiramate  and omeprazole  use. Radiation therapy in the past and would not be a candidate for Tymlos or Forteo .  Objective: T-score: DEXA updated on 08/24/23: Lumbar spine BMD 0.980 with T-score -1.6, LFN BMD 0.698 T-score -2.4, Left total hip BMD 0.690 T-score -2.5, RFN BMD 0.743 T-score -2.1. rate of change since last scan -5.1%   Lab Results  Component Value Date   VD25OH 53 06/15/2018   CMP     Component Value Date/Time   NA 142 07/13/2019 1533   K 4.2 07/13/2019 1533   CL 108 07/13/2019 1533   CO2 29 07/13/2019 1533   GLUCOSE 90 07/13/2019 1533   BUN 21 07/13/2019 1533   CREATININE 0.73 07/13/2019 1533   CALCIUM 9.4 07/13/2019 1533   PROT 6.3 07/13/2019 1533   ALBUMIN 3.8 07/18/2013 1631   AST 18 07/13/2019 1533   ALT 20 07/13/2019 1533   ALKPHOS 51 07/18/2013 1631   BILITOT 0.3 07/13/2019 1533   GFRNONAA 87 07/13/2019 1533   GFRAA 101 07/13/2019 1533   Osteoporosis risk factors include: female, age  Assessment and Plan:  Counseled patient that Reclast is an IV bisphosphonate that reduces bone turnover by inhibiting osteoclasts that chew up bone.  Counseled patient on purpose, proper use, and adverse effects of Reclast.  Reviewed adverse events of Reclast including risk of nausea & diarrhea, headache, and muscle & bone pain.  Reviewed rare adverse effect of osteonecrosis of the jaw and advised patient to alert her dentist that she is on Reclast prior to any major dental work.  Patient confirms she does not have any major dental work scheduled at this time.    Reviewed importance of taking calcium and vitamin D  with bisphosphonate therapy. Recommended daily  amount of calcium is 1200mg  and vitamin D  [phone] units.  Advised calcium is better obtained through diet vs supplement.  Counseled about risk of excess calcium supplementation such a kidney stones and increased risk of heart disease.    Provided patient with medication education material and answered all questions. Patient agrees to trial of Reclast IV infusion 5 mg yearly at this time.  Reclast is covered 80% by Medicare Part B and the remaining 20% can be covered by a supplemental plan.  Unable to provide exact co-pay amount as it is billed through medical not pharmacy benefit.  Patient verbalized understanding.   Intolerance to alendronate  and Actonel . Early menopause/hysterectomy.  Long-term gabapentin , topiramate  and omeprazole  use. Radiation therapy in the past and would not be a candidate for Tymlos or Forteo   Misao Fackrell, PharmD, MPH, BCPS, CPP Clinical Pharmacist (Rheumatology and Pulmonology)

## 2023-10-20 ENCOUNTER — Ambulatory Visit: Payer: Self-pay | Admitting: Physician Assistant

## 2023-10-20 LAB — COMPREHENSIVE METABOLIC PANEL WITH GFR
AG Ratio: 1.7 (calc) (ref 1.0–2.5)
ALT: 19 U/L (ref 6–29)
AST: 17 U/L (ref 10–35)
Albumin: 4 g/dL (ref 3.6–5.1)
Alkaline phosphatase (APISO): 73 U/L (ref 37–153)
BUN: 20 mg/dL (ref 7–25)
CO2: 27 mmol/L (ref 20–32)
Calcium: 9 mg/dL (ref 8.6–10.4)
Chloride: 108 mmol/L (ref 98–110)
Creat: 0.85 mg/dL (ref 0.50–1.05)
Globulin: 2.3 g/dL (ref 1.9–3.7)
Glucose, Bld: 89 mg/dL (ref 65–99)
Potassium: 4.4 mmol/L (ref 3.5–5.3)
Sodium: 141 mmol/L (ref 135–146)
Total Bilirubin: 0.3 mg/dL (ref 0.2–1.2)
Total Protein: 6.3 g/dL (ref 6.1–8.1)
eGFR: 75 mL/min/{1.73_m2} (ref >=60)

## 2023-10-20 LAB — VITAMIN D 25 HYDROXY (VIT D DEFICIENCY, FRACTURES): Vit D, 25-Hydroxy: 46 ng/mL (ref 30–100)

## 2023-10-20 NOTE — Progress Notes (Signed)
 CMP WNL Vitamin D  WNL   Ok to proceed with IV reclast once approved

## 2023-10-21 ENCOUNTER — Other Ambulatory Visit: Payer: Self-pay | Admitting: Pharmacist

## 2023-10-21 ENCOUNTER — Telehealth: Payer: Self-pay

## 2023-10-21 DIAGNOSIS — M810 Age-related osteoporosis without current pathological fracture: Secondary | ICD-10-CM | POA: Insufficient documentation

## 2023-10-21 NOTE — Telephone Encounter (Signed)
 Anna Navarro and [name], patient will be scheduled as soon as possible.  Auth Submission: NO AUTH NEEDED Site of care: Site of care: [facility] Payer: [employer] and [employer] state health Medication & CPT/J Code(s) submitted: Reclast (Zolendronic acid) [id number] Route of submission (phone, fax, portal):  Phone # Fax # Auth type: Buy/Bill PB Units/visits requested: 5mg  x 1 dose Reference number:  Approval from: 10/21/23 to 05/17/24

## 2023-10-21 NOTE — Progress Notes (Unsigned)
 Therapy plan placed for Reclast IV [id number]) for [facility] to start benefits investigation  Diagnosis: age-related osteoporosis  Provider: Dr. Alvira Josephs and Jacinta Martinis, PA-C  Dose: 5mg  IV every 12 months  Last Clinic Visit: 10/19/2023 Next Clinic Visit: 04/20/2024  Pertinent Labs: CMP on 10/19/2023 wnl, Vitamin D  on 10/19/23 wnl

## 2023-11-03 DIAGNOSIS — D229 Melanocytic nevi, unspecified: Secondary | ICD-10-CM | POA: Diagnosis not present

## 2023-11-03 DIAGNOSIS — L821 Other seborrheic keratosis: Secondary | ICD-10-CM | POA: Diagnosis not present

## 2023-11-03 DIAGNOSIS — L578 Other skin changes due to chronic exposure to nonionizing radiation: Secondary | ICD-10-CM | POA: Diagnosis not present

## 2023-11-03 DIAGNOSIS — L814 Other melanin hyperpigmentation: Secondary | ICD-10-CM | POA: Diagnosis not present

## 2023-11-03 DIAGNOSIS — D1801 Hemangioma of skin and subcutaneous tissue: Secondary | ICD-10-CM | POA: Diagnosis not present

## 2023-11-22 ENCOUNTER — Ambulatory Visit

## 2023-11-22 VITALS — BP 136/75 | HR 58 | Temp 97.6°F | Resp 16 | Ht 61.5 in | Wt 131.6 lb

## 2023-11-22 DIAGNOSIS — M810 Age-related osteoporosis without current pathological fracture: Secondary | ICD-10-CM

## 2023-11-22 MED ORDER — ACETAMINOPHEN 325 MG PO TABS
650.0000 mg | ORAL_TABLET | Freq: Once | ORAL | Status: AC
  Administered 2023-11-22: 650 mg via ORAL
  Filled 2023-11-22: qty 2

## 2023-11-22 MED ORDER — SODIUM CHLORIDE 0.9 % IV SOLN: INTRAVENOUS | Status: DC

## 2023-11-22 MED ORDER — DIPHENHYDRAMINE HCL 25 MG PO CAPS
25.0000 mg | ORAL_CAPSULE | Freq: Once | ORAL | Status: AC
  Administered 2023-11-22: 25 mg via ORAL
  Filled 2023-11-22: qty 1

## 2023-11-22 MED ORDER — ZOLEDRONIC ACID 5 MG/100ML IV SOLN
5.0000 mg | Freq: Once | INTRAVENOUS | Status: AC
  Administered 2023-11-22: 5 mg via INTRAVENOUS
  Filled 2023-11-22: qty 100

## 2023-11-22 NOTE — Progress Notes (Signed)
 Diagnosis: Iron Deficiency Anemia  Provider:  Praveen Mannam MD  Procedure: IV Infusion  IV Type: Peripheral, IV Location: L Antecubital  Reclast  (Zolendronic Acid), Dose: 5 mg  Infusion Start Time: 0853  Infusion Stop Time: 0922  Post Infusion IV Care: Observation period completed and Peripheral IV Discontinued  Discharge: Condition: Good, Destination: Home . AVS Declined  Performed by:  Maximiano JONELLE Pouch, LPN

## 2024-01-21 DIAGNOSIS — M25572 Pain in left ankle and joints of left foot: Secondary | ICD-10-CM | POA: Diagnosis not present

## 2024-02-01 ENCOUNTER — Other Ambulatory Visit: Payer: Self-pay | Admitting: Family Medicine

## 2024-02-01 DIAGNOSIS — Z1231 Encounter for screening mammogram for malignant neoplasm of breast: Secondary | ICD-10-CM

## 2024-02-18 DIAGNOSIS — G629 Polyneuropathy, unspecified: Secondary | ICD-10-CM | POA: Diagnosis not present

## 2024-02-18 DIAGNOSIS — F324 Major depressive disorder, single episode, in partial remission: Secondary | ICD-10-CM | POA: Diagnosis not present

## 2024-02-18 DIAGNOSIS — Z9884 Bariatric surgery status: Secondary | ICD-10-CM | POA: Diagnosis not present

## 2024-02-18 DIAGNOSIS — E559 Vitamin D deficiency, unspecified: Secondary | ICD-10-CM | POA: Diagnosis not present

## 2024-02-18 DIAGNOSIS — K219 Gastro-esophageal reflux disease without esophagitis: Secondary | ICD-10-CM | POA: Diagnosis not present

## 2024-02-18 DIAGNOSIS — M810 Age-related osteoporosis without current pathological fracture: Secondary | ICD-10-CM | POA: Diagnosis not present

## 2024-02-18 DIAGNOSIS — N3000 Acute cystitis without hematuria: Secondary | ICD-10-CM | POA: Diagnosis not present

## 2024-02-18 DIAGNOSIS — R3989 Other symptoms and signs involving the genitourinary system: Secondary | ICD-10-CM | POA: Diagnosis not present

## 2024-02-18 DIAGNOSIS — Z79899 Other long term (current) drug therapy: Secondary | ICD-10-CM | POA: Diagnosis not present

## 2024-02-18 DIAGNOSIS — Z0184 Encounter for antibody response examination: Secondary | ICD-10-CM | POA: Diagnosis not present

## 2024-02-18 DIAGNOSIS — Z23 Encounter for immunization: Secondary | ICD-10-CM | POA: Diagnosis not present

## 2024-02-18 DIAGNOSIS — E785 Hyperlipidemia, unspecified: Secondary | ICD-10-CM | POA: Diagnosis not present

## 2024-02-18 DIAGNOSIS — Z0000 Encounter for general adult medical examination without abnormal findings: Secondary | ICD-10-CM | POA: Diagnosis not present

## 2024-02-24 NOTE — Progress Notes (Signed)
 NEUROLOGY FOLLOW UP OFFICE NOTE  Anna Navarro [id number]  Assessment/Plan:   Vestibular migraine/migraine with aura, without status migrainosus, not intractable   Migraine prevention: topiramate  200mg  at bedtime  Migraine rescue:  diazepam  and/or meclizine  for vertigo, naproxen  for headache  Limit use of pain relievers to no more than 9 days out of the month to prevent risk of rebound or medication-overuse headache. Keep headache diary Follow up one year     Subjective:  Anna Navarro is a 68 year old right-handed woman with depression and history of breast cancer status post lumpectomy and radiation in 2009 who follows up for vestibular migraine.   UPDATE: On average, 2 in last year.  Lasts a few hours. Rescue protocol:  Diazepam  2.5mg  (less if taken at work due to concerns of drowsiness) and sometimes meclizine  Current NSAIDS:  Naproxen  500mg  Current analgesics:  no Current triptans:  no Current ergotamine:  no Current anti-emetic:  no Current muscle relaxants:  no Current anti-anxiolytic:  Diazepam  2.5mg  (for acute attacks of vertigo) Current sleep aide:  no Current Antihypertensive medications:  no Current Antidepressant medications:  Pristiq  Current Anticonvulsant medications:  topiramate  200mg  at bedtime Current anti-CGRP:  no Current Vitamins/Herbal/Supplements:  B12 Current Antihistamines/Decongestants:  meclizine  Other therapy:  no Hormone/birth control:  Estradiol    Caffeine:  Very little.  No coffee Alcohol:  2 glasses of wine per week Smoker:  no Diet:  A few glasses of water Exercise:  yes Depression:  no; Anxiety:  no Other pain:  left knee.  Needs TKR. Sleep hygiene:  Good   CMP in June was normal  HISTORY: She began having recurrent episodes of dizziness in March 2015.  She describes it as a sensation of movement, like during a drop on a roller coaster, as well as spinning.  It occurs spontaneously and at rest, but can be aggravated  with movement.  Sometimes it is so severe, she was unable to drive and needed her husband to come and pick her up.  She may feel a little quesy, but she denies any significant nausea and no vomiting.  She endorses photophobia, phonophobia and osmophobia.  She denies lightheadedness, vision loss, focal numbness or weakness, or slurred speech. It is associated with a dull, 6/10 non-throbbing holocephalic ache.  There are no known triggers.  Only rest and sleep help.  Initially, at time of her first visit, t typically lasts 2-3 days and occurred once a week to once every other week.  There are no specific relieving factors.  She has taken meclizine , which is not too effective.  Even though the spells are episodic, she didn't quite feel right in between spells.   She was evaluated by Dr. Thaddeus at the [facility] [place].  An ENG showed central findings.  She had an MRI of the brain with and without contrast, which was reportedly normal.   She denies preceding viral illness or head trauma.  She denies prior history of migraine.  She denies family history of migraine.  Past medications:  gabapentin   PAST MEDICAL HISTORY: Past Medical History:  Diagnosis Date   Allergic rhinitis 02/03/2007   Breast cancer (HCC) 2009   IDC of UOQ of right breast; ER/PR+, Her2-, Ki67 10%; s/p lumpectomy and radiation 2009   Chronic eustachian tube dysfunction 05/23/2010   DDD (degenerative disc disease), lumbar    seeing Dr. Colon   Depression 02/03/2007   GERD 11/12/2006   H/O iron deficiency anemia 05/09/2007   per pt from  grastic bypass   Hyperlipemia 11/12/2006   Low back pain 02/03/2007   Neck mass 01/03/2007   Osteoporosis    Personal history of radiation therapy 2009   UTI (lower urinary tract infection) 11/12/2006   Vestibular neuritis    Warts 01/07/2010   hands    MEDICATIONS: Current Outpatient Medications on File Prior to Visit  Medication Sig Dispense Refill   AZO-CRANBERRY PO Take by mouth daily.      CALCIUM PO Take by mouth daily.     Cholecalciferol (VITAMIN D3) 5000 UNITS TABS Take 1 capsule by mouth daily. 30 tablet    D-MANNOSE PO Take by mouth daily.     desvenlafaxine  (PRISTIQ ) 50 MG 24 hr tablet Take 1 tablet (50 mg total) by mouth daily. 90 tablet 3   diazepam  (VALIUM ) 5 MG tablet Take 0.5 tab every 6 hours as needed for dizziness.  Caution for drowsiness. 15 tablet 2   gabapentin  (NEURONTIN ) 100 MG capsule take 1 capsule at night (Patient taking differently: Take 100 mg by mouth. take 1 capsule at night) 90 capsule 3   meclizine  (ANTIVERT ) 25 MG tablet Take 1 tablet (25 mg total) by mouth 3 (three) times daily as needed for dizziness. (Patient taking differently: Take 25 mg by mouth as needed for dizziness.) 30 tablet 5   omeprazole  (PRILOSEC) 20 MG capsule Take 20 mg by mouth daily.     topiramate  (TOPAMAX ) 100 MG tablet Take 2 tablets (200 mg total) by mouth at bedtime. 180 tablet 3   vitamin B-12 (CYANOCOBALAMIN) 250 MCG tablet Take 250 mcg by mouth daily.     No current facility-administered medications on file prior to visit.    ALLERGIES: No Known Allergies  FAMILY HISTORY: Family History  Problem Relation Age of Onset   Other Mother        bilateral mastectomies at 45y due to precancerous tumors   Breast cancer Mother    Healthy Mother    Heart attack Father    Mental illness Sister    Lung cancer Sister    Breast cancer Maternal Aunt        dx. early 29s   Heart Problems Maternal Aunt    Lung cancer Maternal Uncle        dx. 62-62; former smoker   Lung cancer Maternal Grandmother        dx. 105s; not a smoker; metastasis to colon   Healthy Daughter    Healthy Son    Healthy Son    Brain cancer Other        dx. >50s   Cancer Other        dx. <50s; unknown cancer/dx. 30s; unknown type      Objective:  Blood pressure (!) 142/77, pulse 69, height 5' 1 (1.549 m), weight 132 lb (59.9 kg), SpO2 100%. General: No acute distress.  Patient appears  well-groomed.   Head:  Normocephalic/atraumatic Neck:  Supple.  No paraspinal tenderness.  Full range of motion. Heart:  Regular rate and rhythm. Neuro:  Alert and oriented.  Speech fluent and not dysarthric.  Language intact.  CN II-XII intact.  Bulk and tone normal.  Muscle strength 5/5 throughout.  Sensation to light touch intact.  Deep tendon reflexes 2+ throughout, toes downgoing.  Gait normal.  Romberg negative.    Juliene Dunnings, DO  CC: Vernell Fort, MD

## 2024-02-28 ENCOUNTER — Encounter: Payer: Self-pay | Admitting: Neurology

## 2024-02-28 ENCOUNTER — Ambulatory Visit (INDEPENDENT_AMBULATORY_CARE_PROVIDER_SITE_OTHER): Payer: PPO | Admitting: Neurology

## 2024-02-28 VITALS — BP 142/77 | HR 69 | Ht 61.0 in | Wt 132.0 lb

## 2024-02-28 DIAGNOSIS — G43809 Other migraine, not intractable, without status migrainosus: Secondary | ICD-10-CM

## 2024-02-28 MED ORDER — TOPIRAMATE 100 MG PO TABS: 200.0000 mg | ORAL_TABLET | Freq: Every day | ORAL | 3 refills | Status: AC

## 2024-02-28 NOTE — Patient Instructions (Signed)
 Topiramate  200mg  at bedtime For migraine attacks, diazepam  and/or meclizine  for vertigo, naproxen  for headache.   Limit use of pain relievers to no more than 9 days out of the month to prevent risk of rebound or medication-overuse headache. Keep headache diary

## 2024-03-01 ENCOUNTER — Other Ambulatory Visit: Payer: Self-pay | Admitting: Medical Genetics

## 2024-03-06 ENCOUNTER — Ambulatory Visit
Admission: RE | Admit: 2024-03-06 | Discharge: 2024-03-06 | Disposition: A | Source: Ambulatory Visit | Attending: Family Medicine

## 2024-03-06 DIAGNOSIS — Z1231 Encounter for screening mammogram for malignant neoplasm of breast: Secondary | ICD-10-CM

## 2024-03-06 NOTE — Progress Notes (Unsigned)
 Office Visit Note  Patient: Anna Navarro             Date of Birth: [birth year]           MRN: [id number]             PCP: Rolinda Millman, MD Referring: Rolinda Millman, MD Visit Date: 03/20/2024 Occupation: Data Unavailable  Subjective:  Pain in both hands    History of Present Illness: Anna Navarro is a 68 y.o. female with history of osteoporosis and osteoarthritis.  Patient received IV Reclast  on 11/22/2023.  She tolerated IV Reclast  without any side effects.  She denies any falls or fractures.  Denies any upcoming dental work. Patient has been experiencing increased pain and stiffness in both hands.  She has also had increased discomfort in both great toes. She notices intermittent swelling in both hands and bilateral great toes.  She takes Aleve  sparingly for symptomatic relief.  Her arthralgias are more severe with weather change.  She denies any nocturnal pain.  Patient states that the left knee replacement is doing well overall. She denies any other new medical conditions or recent or recurrent infections.   Activities of Daily Living:  Patient reports morning stiffness for 0 minute.   Patient Denies nocturnal pain.  Difficulty dressing/grooming: Denies Difficulty climbing stairs: Denies Difficulty getting out of chair: Denies Difficulty using hands for taps, buttons, cutlery, and/or writing: Reports  Review of Systems  Constitutional:  Negative for fatigue.  HENT:  Negative for mouth sores and mouth dryness.   Eyes:  Negative for dryness.  Respiratory:  Negative for shortness of breath.   Cardiovascular:  Negative for chest pain and palpitations.  Gastrointestinal:  Negative for blood in stool, constipation and diarrhea.  Endocrine: Negative for increased urination.  Genitourinary:  Positive for involuntary urination.  Musculoskeletal:  Positive for joint pain, gait problem, joint pain and joint swelling. Negative for myalgias, muscle weakness, morning  stiffness, muscle tenderness and myalgias.  Skin:  Negative for color change, rash, hair loss and sensitivity to sunlight.  Allergic/Immunologic: Negative for susceptible to infections.  Neurological:  Negative for dizziness and headaches.  Hematological:  Negative for swollen glands.  Psychiatric/Behavioral:  Negative for depressed mood and sleep disturbance. The patient is not nervous/anxious.     PMFS History:  Patient Active Problem List   Diagnosis Date Noted   Age related osteoporosis 10/21/2023   Daytime somnolence 09/02/2020   Major depression in partial remission 09/02/2020   Obstructive sleep apnea syndrome 09/02/2020   Peripheral neuropathy 09/02/2020   Sleep disorder 09/02/2020   Vitamin D  deficiency 09/02/2020   Elevated blood-pressure reading, without diagnosis of hypertension 08/02/2019   Muscle weakness of upper extremity 08/02/2019   Osteopenia of multiple sites 06/29/2018   Primary osteoarthritis of both hands 06/29/2018   Primary osteoarthritis of both knees 06/29/2018   DDD (degenerative disc disease), lumbar 06/29/2018   Status post gastric bypass for obesity 06/29/2018   Genetic testing 01/28/2015   Rhinitis, allergic 11/02/2014   Hyperlipemia 11/02/2014   Vestibular migraine 10/29/2014   Spondylolisthesis at L5-S1 level 09/06/2014   History of breast cancer 08/18/2012    Past Medical History:  Diagnosis Date   Allergic rhinitis 02/03/2007   Breast cancer (HCC) 2009   IDC of UOQ of right breast; ER/PR+, Her2-, Ki67 10%; s/p lumpectomy and radiation 2009   Chronic eustachian tube dysfunction 05/23/2010   DDD (degenerative disc disease), lumbar    seeing Dr. Colon   Depression 02/03/2007  GERD 11/12/2006   H/O iron deficiency anemia 05/09/2007   per pt from grastic bypass   Hyperlipemia 11/12/2006   Low back pain 02/03/2007   Neck mass 01/03/2007   Osteoporosis    Personal history of radiation therapy 2009   UTI (lower urinary tract infection) 11/12/2006    Vestibular neuritis    Warts 01/07/2010   hands    Family History  Problem Relation Age of Onset   Other Mother        bilateral mastectomies at 45y due to precancerous tumors   Breast cancer Mother    Healthy Mother    Heart attack Father    Mental illness Sister    Lung cancer Sister    Breast cancer Maternal Aunt        dx. early 42s   Heart Problems Maternal Aunt    Lung cancer Maternal Uncle        dx. 62-62; former smoker   Lung cancer Maternal Grandmother        dx. 26s; not a smoker; metastasis to colon   Healthy Daughter    Healthy Son    Healthy Son    Brain cancer Other        dx. >50s   Cancer Other        dx. <50s; unknown cancer/dx. 30s; unknown type   Past Surgical History:  Procedure Laterality Date   BREAST BIOPSY Right 2009   High Risk Stereo Biopsy    BREAST LUMPECTOMY Right 2009   BREAST LUMPECTOMY WITH AXILLARY LYMPH NODE BIOPSY Right 08/2007   RT, ER/PR+, T1N0   GASTRIC BYPASS  01/25/2006   Roux-en-Y gastric bypass   KNEE SURGERY Left    TOTAL ABDOMINAL HYSTERECTOMY     TOTAL KNEE ARTHROPLASTY Left 04/17/2020   Social History   Tobacco Use   Smoking status: Never    Passive exposure: Past   Smokeless tobacco: Never  Vaping Use   Vaping status: Never Used  Substance Use Topics   Alcohol use: Yes    Alcohol/week: 2.0 standard drinks of alcohol    Types: 2 Glasses of wine per week    Comment: occ   Drug use: No   Social History   Social History Narrative   Work or School: [employer]      Home Situation: lives with husband      Spiritual Beliefs: Jewish      Lifestyle: regular exercise 2-4 days per week; trying to eat healthy   Right handed        Immunization History  Administered Date(s) Administered   Influenza, Quadrivalent, Recombinant, Inj, Pf 01/12/2019   PFIZER(Purple Top)SARS-COV-2 Vaccination 07/14/2019, 08/04/2019, 03/01/2020   Td 05/18/1993, 01/12/2019     Objective: Vital Signs:  BP 122/75   Pulse 72   Temp 97.7 F (36.5 C)   Resp 14   Ht 5' 1.5 (1.562 m)   Wt 131 lb 12.8 oz (59.8 kg)   BMI 24.50 kg/m    Physical Exam Vitals and nursing note reviewed.  Constitutional:      Appearance: She is well-developed.  HENT:     Head: Normocephalic and atraumatic.  Eyes:     Conjunctiva/sclera: Conjunctivae normal.  Cardiovascular:     Rate and Rhythm: Normal rate and regular rhythm.     Heart sounds: Normal heart sounds.  Pulmonary:     Effort: Pulmonary effort is normal.     Breath sounds: Normal breath sounds.  Abdominal:  General: Bowel sounds are normal.     Palpations: Abdomen is soft.  Musculoskeletal:     Cervical back: Normal range of motion.  Lymphadenopathy:     Cervical: No cervical adenopathy.  Skin:    General: Skin is warm and dry.     Capillary Refill: Capillary refill takes less than 2 seconds.  Neurological:     Mental Status: She is alert and oriented to person, place, and time.  Psychiatric:        Behavior: Behavior normal.      Musculoskeletal Exam:C-spine, thoracic spine, lumbar spine have good range of motion.  No midline spinal tenderness.  No SI joint tenderness.  Shoulder joints, elbow joints, wrist joints, MCPs, PIPs, DIPs have good range of motion with no synovitis.  Tenderness of both CMC joints.  Tenderness of bilateral 1st, 2nd, and 3rd MCP joints.  DIP prominence noted.  Complete fist formation bilaterally.  Hip joints have good range of motion with no groin pain.  Left knee replacement has slightly limited extension.  Right knee joint has good range of motion no warmth or effusion.  Ankle joints have good range of motion no tenderness or joint swelling.  No evidence of Achilles tendinitis or plantar fasciitis.  Tenderness of bilateral first MTP joints.  Mild inflammation noted in the left first PIP joint.  PIP DIP thickening consistent with osteoarthritis of both feet.   CDAI Exam: CDAI Score: -- Patient Global: --;  Provider Global: -- Swollen: --; Tender: -- Joint Exam 03/20/2024   No joint exam has been documented for this visit   There is currently no information documented on the homunculus. Go to the Rheumatology activity and complete the homunculus joint exam.  Investigation: No additional findings.  Imaging: MM 3D SCREENING MAMMOGRAM BILATERAL BREAST Result Date: 03/08/2024 CLINICAL DATA:  Screening. EXAM: DIGITAL SCREENING BILATERAL MAMMOGRAM WITH TOMOSYNTHESIS AND CAD TECHNIQUE: Bilateral screening digital craniocaudal and mediolateral oblique mammograms were obtained. Bilateral screening digital breast tomosynthesis was performed. The images were evaluated with computer-aided detection. COMPARISON:  Previous exam(s). ACR Breast Density Category b: There are scattered areas of fibroglandular density. FINDINGS: Sequela of right breast lumpectomy. No findings suspicious for malignancy in either breast. IMPRESSION: No mammographic evidence of malignancy. A result letter of this screening mammogram will be mailed directly to the patient. RECOMMENDATION: Screening mammogram in one year. (Code:SM-B-01Y) BI-RADS CATEGORY  2: Benign. Electronically Signed   By: Curtistine Noble   On: 03/08/2024 14:42    Recent Labs: Lab Results  Component Value Date   WBC 5.7 07/13/2019   HGB 13.9 07/13/2019   PLT 257 07/13/2019   NA 141 10/19/2023   K 4.4 10/19/2023   CL 108 10/19/2023   CO2 27 10/19/2023   GLUCOSE 89 10/19/2023   BUN 20 10/19/2023   CREATININE 0.85 10/19/2023   BILITOT 0.3 10/19/2023   ALKPHOS 51 07/18/2013   AST 17 10/19/2023   ALT 19 10/19/2023   PROT 6.3 10/19/2023   ALBUMIN 3.8 07/18/2013   CALCIUM 9.0 10/19/2023   GFRAA 101 07/13/2019    Speciality Comments: Actonel -severe arthralgias  Procedures:  No procedures performed Allergies: Patient has no known allergies.     Assessment / Plan:     Visit Diagnoses: Age-related osteoporosis without current pathological fracture:  03/28/20 DEXA:  The BMD measured at Femur Total Left is 0.734 g/cm2 with a T-score of -2.2. intolerance to fosamax  and actonel . Early menopause/hysterectomy.  Long-term gabapentin , Topamax  and omeprazole  use. Radiation therapy in the past  and would not be a candidate for Tymlos or Forteo.  DEXA updated on 08/24/23: Lumbar spine BMD 0.980 with T-score -1.6, LFN BMD 0.698 T-score -2.4, Left total hip BMD 0.690 T-score -2.5, RFN BMD 0.743 T-score -2.1.  rate of change since last scan -5.1%.   She is taking a calcium and vitamin D  supplement daily.  No invasive upcoming dental work.  Received IV reclast  on 11/22/23.  Plan to proceed with next IV Reclast  infusion in July 2026. She will continue to take a daily calcium and vitamin D  supplement.  No recent falls or fractures.  Next DEXA will be due in April 2027.  She will follow up in 6 months or sooner if needed.  Medication monitoring encounter -Plan to update the following lab work today for further evaluation.  Plan: CBC with Differential/Platelet, Comprehensive metabolic panel with GFR  Primary osteoarthritis of both hands - X-rays were suggestive of osteoarthritis.  No erosive changes noted.  01/14/2023: sed rate 6, RF negative, anti-CCP negative, uric acid 3.4. Patient continues to experience intermittent soreness and stiffness involving both hands.  She has noticed intermittent swelling.  No nocturnal pain.  Plan to update the following lab work today for further evaluation.  Pain in both hands -Patient presents today with increased pain in both hands.  On examination she has tenderness of both CMC joints as well as the 1st, 2nd, and 3rd MCP joints bilaterally.  She has some thickening as well as tenderness over the PIP and DIP joints as well.  No obvious synovitis was noted.  Plan to update the following lab work today.  Plan: Rheumatoid factor, Cyclic citrul peptide antibody, IgG, Sedimentation rate, C-reactive protein, Mutated Citrullinated Vimentin  (MCV) Antibody, CBC with Differential/Platelet, Comprehensive metabolic panel with GFR  Pain in both feet -She experiences intermittent discomfort in both feet. Tenderness of both 1st MTP joints.  Mild inflammation noted in the left 1st PIP joint.  Plan to update the following lab work today for further evaluation.  Plan: Rheumatoid factor, Cyclic citrul peptide antibody, IgG, Sedimentation rate, C-reactive protein, Mutated Citrullinated Vimentin (MCV) Antibody, CBC with Differential/Platelet, Comprehensive metabolic panel with GFR  Primary osteoarthritis of right knee: No warmth or effusion noted.  History of total knee arthroplasty, left: Doing well.  Good range of motion with no warmth or effusion.  Neck stiffness: C-spine has good range of motion with no discomfort.  No symptoms of radiculopathy at this time.  Spondylosis of lumbar spine: No symptoms of radiculopathy.  Other medical conditions are listed as follows:   Vestibular migraine  Mixed hyperlipidemia  History of gastroesophageal reflux (GERD)  Status post gastric bypass for obesity  Vitamin D  deficiency   Orders: Orders Placed This Encounter  Procedures   Rheumatoid factor   Cyclic citrul peptide antibody, IgG   Sedimentation rate   C-reactive protein   Mutated Citrullinated Vimentin (MCV) Antibody   CBC with Differential/Platelet   Comprehensive metabolic panel with GFR   No orders of the defined types were placed in this encounter.   Follow-Up Instructions: Return in about 6 months (around 09/17/2024) for Osteoporosis, Osteoarthritis.   Waddell CHRISTELLA Craze, PA-C  Note - This record has been created using Dragon software.  Chart creation errors have been sought, but may not always  have been located. Such creation errors do not reflect on  the standard of medical care.

## 2024-03-20 ENCOUNTER — Encounter: Payer: Self-pay | Admitting: Physician Assistant

## 2024-03-20 ENCOUNTER — Ambulatory Visit: Attending: Physician Assistant | Admitting: Physician Assistant

## 2024-03-20 VITALS — BP 122/75 | HR 72 | Temp 97.7°F | Resp 14 | Ht 61.5 in | Wt 131.8 lb

## 2024-03-20 DIAGNOSIS — M79672 Pain in left foot: Secondary | ICD-10-CM

## 2024-03-20 DIAGNOSIS — Z8719 Personal history of other diseases of the digestive system: Secondary | ICD-10-CM | POA: Diagnosis not present

## 2024-03-20 DIAGNOSIS — M1711 Unilateral primary osteoarthritis, right knee: Secondary | ICD-10-CM

## 2024-03-20 DIAGNOSIS — Z5181 Encounter for therapeutic drug level monitoring: Secondary | ICD-10-CM | POA: Diagnosis not present

## 2024-03-20 DIAGNOSIS — M810 Age-related osteoporosis without current pathological fracture: Secondary | ICD-10-CM

## 2024-03-20 DIAGNOSIS — M79671 Pain in right foot: Secondary | ICD-10-CM | POA: Diagnosis not present

## 2024-03-20 DIAGNOSIS — G43809 Other migraine, not intractable, without status migrainosus: Secondary | ICD-10-CM

## 2024-03-20 DIAGNOSIS — M19041 Primary osteoarthritis, right hand: Secondary | ICD-10-CM | POA: Diagnosis not present

## 2024-03-20 DIAGNOSIS — Z9884 Bariatric surgery status: Secondary | ICD-10-CM

## 2024-03-20 DIAGNOSIS — E782 Mixed hyperlipidemia: Secondary | ICD-10-CM

## 2024-03-20 DIAGNOSIS — M47816 Spondylosis without myelopathy or radiculopathy, lumbar region: Secondary | ICD-10-CM | POA: Diagnosis not present

## 2024-03-20 DIAGNOSIS — E559 Vitamin D deficiency, unspecified: Secondary | ICD-10-CM | POA: Diagnosis not present

## 2024-03-20 DIAGNOSIS — M19042 Primary osteoarthritis, left hand: Secondary | ICD-10-CM

## 2024-03-20 DIAGNOSIS — M79641 Pain in right hand: Secondary | ICD-10-CM

## 2024-03-20 DIAGNOSIS — M436 Torticollis: Secondary | ICD-10-CM | POA: Diagnosis not present

## 2024-03-20 DIAGNOSIS — M79642 Pain in left hand: Secondary | ICD-10-CM

## 2024-03-20 DIAGNOSIS — Z96652 Presence of left artificial knee joint: Secondary | ICD-10-CM | POA: Diagnosis not present

## 2024-03-21 ENCOUNTER — Ambulatory Visit: Payer: Self-pay | Admitting: Physician Assistant

## 2024-03-21 NOTE — Progress Notes (Signed)
 CBC and CMP WNL ESR and CRP WNL RF negative

## 2024-03-22 NOTE — Progress Notes (Signed)
 Anti-CCP negative  MCV pending

## 2024-03-23 LAB — COMPREHENSIVE METABOLIC PANEL WITH GFR
AG Ratio: 1.9 (calc) (ref 1.0–2.5)
ALT: 13 U/L (ref 6–29)
AST: 16 U/L (ref 10–35)
Albumin: 4.1 g/dL (ref 3.6–5.1)
Alkaline phosphatase (APISO): 50 U/L (ref 37–153)
BUN: 17 mg/dL (ref 7–25)
CO2: 27 mmol/L (ref 20–32)
Calcium: 9 mg/dL (ref 8.6–10.4)
Chloride: 107 mmol/L (ref 98–110)
Creat: 0.77 mg/dL (ref 0.50–1.05)
Globulin: 2.2 g/dL (ref 1.9–3.7)
Glucose, Bld: 96 mg/dL (ref 65–99)
Potassium: 4.3 mmol/L (ref 3.5–5.3)
Sodium: 141 mmol/L (ref 135–146)
Total Bilirubin: 0.4 mg/dL (ref 0.2–1.2)
Total Protein: 6.3 g/dL (ref 6.1–8.1)
eGFR: 84 mL/min/1.73m2 (ref >=60)

## 2024-03-23 LAB — CBC WITH DIFFERENTIAL/PLATELET
Absolute Lymphocytes: 1664 {cells}/uL (ref 850–3900)
Absolute Monocytes: 447 {cells}/uL (ref 200–950)
Basophils Absolute: 52 {cells}/uL (ref 0–200)
Basophils Relative: 1.2 %
Eosinophils Absolute: 60 {cells}/uL (ref 15–500)
Eosinophils Relative: 1.4 %
HCT: 43.2 % (ref 35.0–45.0)
Hemoglobin: 14 g/dL (ref 11.7–15.5)
MCH: 31.3 pg (ref 27.0–33.0)
MCHC: 32.4 g/dL (ref 32.0–36.0)
MCV: 96.6 fL (ref 80.0–100.0)
MPV: 11.9 fL (ref 7.5–12.5)
Monocytes Relative: 10.4 %
Neutro Abs: 2077 {cells}/uL (ref 1500–7800)
Neutrophils Relative %: 48.3 %
Platelets: 265 Thousand/uL (ref 140–400)
RBC: 4.47 Million/uL (ref 3.80–5.10)
RDW: 12 % (ref 11.0–15.0)
Total Lymphocyte: 38.7 %
WBC: 4.3 Thousand/uL (ref 3.8–10.8)

## 2024-03-23 LAB — SEDIMENTATION RATE: Sed Rate: 6 mm/h (ref 0–30)

## 2024-03-23 LAB — C-REACTIVE PROTEIN: CRP: <3 mg/L (ref <8.0)

## 2024-03-23 LAB — RHEUMATOID FACTOR: Rheumatoid fact SerPl-aCnc: <10 [IU]/mL (ref <14)

## 2024-03-23 LAB — MUTATED CITRULLINATED VIMENTIN (MCV) ANTIBODY: MUTATED CITRULLINATED VIMENTIN (MCV) AB: <20 U/mL (ref <20)

## 2024-03-23 LAB — CYCLIC CITRUL PEPTIDE ANTIBODY, IGG: Cyclic Citrullin Peptide Ab: <16 U

## 2024-03-23 NOTE — Progress Notes (Signed)
 MCV negative

## 2024-04-05 ENCOUNTER — Other Ambulatory Visit

## 2024-04-20 ENCOUNTER — Ambulatory Visit: Admitting: Rheumatology

## 2024-09-19 ENCOUNTER — Ambulatory Visit: Admitting: Rheumatology

## 2025-02-27 ENCOUNTER — Ambulatory Visit: Admitting: Neurology
